# Patient Record
Sex: Female | Born: 1945
Health system: Southern US, Community
[De-identification: ages and names within clinical notes are randomized; demographics above are authoritative.]

## PROBLEM LIST (undated history)

## (undated) DIAGNOSIS — M1710 Unilateral primary osteoarthritis, unspecified knee: Secondary | ICD-10-CM

## (undated) DIAGNOSIS — M1990 Unspecified osteoarthritis, unspecified site: Secondary | ICD-10-CM

## (undated) DIAGNOSIS — E785 Hyperlipidemia, unspecified: Secondary | ICD-10-CM

## (undated) DIAGNOSIS — M179 Osteoarthritis of knee, unspecified: Secondary | ICD-10-CM

## (undated) DIAGNOSIS — I10 Essential (primary) hypertension: Secondary | ICD-10-CM

## (undated) DIAGNOSIS — Z9289 Personal history of other medical treatment: Secondary | ICD-10-CM

## (undated) DIAGNOSIS — R011 Cardiac murmur, unspecified: Secondary | ICD-10-CM

## (undated) DIAGNOSIS — F419 Anxiety disorder, unspecified: Secondary | ICD-10-CM

## (undated) DIAGNOSIS — Z9889 Other specified postprocedural states: Secondary | ICD-10-CM

## (undated) DIAGNOSIS — K5730 Diverticulosis of large intestine without perforation or abscess without bleeding: Secondary | ICD-10-CM

## (undated) DIAGNOSIS — R112 Nausea with vomiting, unspecified: Secondary | ICD-10-CM

## (undated) HISTORY — PX: ABDOMINAL SURGERY: SHX537

## (undated) HISTORY — DX: Diverticulosis of large intestine without perforation or abscess without bleeding: K57.30

## (undated) HISTORY — DX: Unilateral primary osteoarthritis, unspecified knee: M17.10

## (undated) HISTORY — DX: Personal history of other medical treatment: Z92.89

## (undated) HISTORY — DX: Essential (primary) hypertension: I10

## (undated) HISTORY — PX: NO PAST SURGERIES: SHX2092

## (undated) HISTORY — PX: COLONOSCOPY: SHX174

## (undated) HISTORY — DX: Hyperlipidemia, unspecified: E78.5

## (undated) HISTORY — DX: Unspecified osteoarthritis, unspecified site: M19.90

## (undated) HISTORY — DX: Osteoarthritis of knee, unspecified: M17.9

## (undated) HISTORY — DX: Cardiac murmur, unspecified: R01.1

---

## 1997-04-19 DIAGNOSIS — K5730 Diverticulosis of large intestine without perforation or abscess without bleeding: Secondary | ICD-10-CM

## 1997-04-19 HISTORY — DX: Diverticulosis of large intestine without perforation or abscess without bleeding: K57.30

## 2011-10-04 ENCOUNTER — Other Ambulatory Visit (HOSPITAL_COMMUNITY)
Admission: RE | Admit: 2011-10-04 | Discharge: 2011-10-04 | Disposition: A | Discharge status: Home / Self Care | Payer: Medicare Other | Source: Ambulatory Visit | Attending: Family Medicine | Admitting: Family Medicine

## 2011-10-04 DIAGNOSIS — Z124 Encounter for screening for malignant neoplasm of cervix: Secondary | ICD-10-CM | POA: Insufficient documentation

## 2012-04-13 ENCOUNTER — Encounter: Payer: Self-pay | Admitting: Internal Medicine

## 2012-04-13 ENCOUNTER — Ambulatory Visit (INDEPENDENT_AMBULATORY_CARE_PROVIDER_SITE_OTHER): Payer: Medicare Other | Admitting: Internal Medicine

## 2012-04-13 VITALS — BP 162/100 | HR 82 | Temp 98.5°F | Ht 63.0 in | Wt 199.0 lb

## 2012-04-13 DIAGNOSIS — E669 Obesity, unspecified: Secondary | ICD-10-CM | POA: Insufficient documentation

## 2012-04-13 DIAGNOSIS — Z1239 Encounter for other screening for malignant neoplasm of breast: Secondary | ICD-10-CM

## 2012-04-13 DIAGNOSIS — M179 Osteoarthritis of knee, unspecified: Secondary | ICD-10-CM | POA: Insufficient documentation

## 2012-04-13 DIAGNOSIS — E785 Hyperlipidemia, unspecified: Secondary | ICD-10-CM | POA: Insufficient documentation

## 2012-04-13 DIAGNOSIS — M1710 Unilateral primary osteoarthritis, unspecified knee: Secondary | ICD-10-CM | POA: Insufficient documentation

## 2012-04-13 DIAGNOSIS — IMO0002 Reserved for concepts with insufficient information to code with codable children: Secondary | ICD-10-CM

## 2012-04-13 DIAGNOSIS — E663 Overweight: Secondary | ICD-10-CM

## 2012-04-13 DIAGNOSIS — I10 Essential (primary) hypertension: Secondary | ICD-10-CM | POA: Insufficient documentation

## 2012-04-13 MED ORDER — AMLODIPINE BESYLATE 10 MG PO TABS: 10.0000 mg | ORAL_TABLET | Freq: Every day | ORAL | Status: DC

## 2012-04-13 NOTE — Assessment & Plan Note (Signed)
Wt Readings from Last 3 Encounters:  04/13/12 199 lb (90.266 kg)   The patient is asked to make an attempt to improve diet and exercise patterns to aid in medical management of this problem. Reviewed effect of weight on blood pressure, lipids and osteoarthritis mgmt

## 2012-04-13 NOTE — Patient Instructions (Signed)
It was good to see you today. we will send to your prior provider(s) for "release of records" as discussed today -  Medications reviewed and updated - increase amlodipine to 10mg  daily, no other changes at this time. Your prescription(s) have been submitted to your pharmacy. Please take as directed and contact our office if you believe you are having problem(s) with the medication(s). we'll make referral to orthopedics for consideration of "gel shots" to your right knee. Our office will contact you regarding appointment(s) once made. Please schedule followup in 4-6 weeks to recheck blood pressure, call sooner if problems.

## 2012-04-13 NOTE — Assessment & Plan Note (Signed)
BP Readings from Last 3 Encounters:  04/13/12 162/100   Uncontrolled Titrate up amlodipine to max now Continue ACEI/hctz Send for labs from prior PCP Advised on DASH and need for weight control - diet/exercise to control same

## 2012-04-13 NOTE — Assessment & Plan Note (Signed)
Refer to ortho to consider synvisc type injects - she declines steroid injection today continue otc symptomatic as ongoing until then

## 2012-04-13 NOTE — Progress Notes (Signed)
Subjective:    Patient ID: Donna Lawson, female    DOB: 10/05/1945, 66 y.o.   MRN: [id number]  HPI New pt to me and our practice - reviewed chronic medical issues: Transfer from [place], move from [place] to be near son after widowed in 2011 Reviewed chronic medical issues:  hypertension - the patient reports compliance with medication(s) as prescribed. Denies adverse side effects.  dyslipidemia - on statin - the patient reports compliance with medication(s) as prescribed. Denies adverse side effects.  osteoarthritis - R knee - constant swelling and pain, worse with exertion - denies prior injections - takes daily OTC ibuprofen or tylenol for same with decreasing pain relief- no prior ortho eval but "xrays show bone on bone"  Past Medical History  Diagnosis Date  . Arthritis   . Heart murmur   . Hypertension   . Hyperlipidemia   . History of blood transfusion   . Diverticula, colon 1999  . Osteoarthritis of knee     right   Family History  Problem Relation Age of Onset  . Arthritis Mother   . Arthritis Father   . Hypertension Other    History  Substance Use Topics  . Smoking status: Former Research scientist (life sciences)  . Smokeless tobacco: Not on file     Comment: widowed  . Alcohol Use: No   Review of Systems Constitutional: Negative for fever or recent weight change.  Respiratory: Negative for cough and shortness of breath.   Cardiovascular: Negative for chest pain or palpitations.  Gastrointestinal: Negative for abdominal pain, no bowel changes.  Musculoskeletal: Negative for gait problem or new joint swelling (chronic R knee swelling).  Skin: Negative for rash.  Neurological: Negative for dizziness or headache.  No other specific complaints in a complete review of systems (except as listed in HPI above).     Objective:   Physical Exam BP 162/100  Pulse 82  Temp 98.5 F (36.9 C) (Oral)  Ht 5\' 3"  (1.6 m)  Wt 199 lb (90.266 kg)  BMI 35.25 kg/m2  SpO2 94% Wt Readings from Last 3  Encounters:  04/13/12 199 lb (90.266 kg)   Constitutional: She is overweight, but appears well-developed and well-nourished. No distress.  HENT: Head: Normocephalic and atraumatic. Ears: B TMs ok, no erythema or effusion; Nose: Nose normal. Mouth/Throat: Oropharynx is clear and moist. No oropharyngeal exudate.  Eyes: Conjunctivae and EOM are normal. Pupils are equal, round, and reactive to light. No scleral icterus.  Neck: Normal range of motion. Neck supple. No LAD. No JVD present. No thyromegaly present.  Cardiovascular: Normal rate, regular rhythm and normal heart sounds.  No murmur heard. No BLE edema. Pulmonary/Chest: Effort normal and breath sounds normal. No respiratory distress. She has no wheezes.  Abdominal: Soft. Bowel sounds are normal. She exhibits no distension. There is no tenderness. no masses Musculoskeletal: R knee - boggy synovitis - tender to palpation over joint line; FROM and ligamentous function intact. Normal range of motion, no joint effusions. No gross deformities Neurological: She is alert and oriented to person, place, and time. No cranial nerve deficit. Coordination normal.  Skin: Skin is warm and dry. No rash noted. No erythema.  Psychiatric: She has a normal mood and affect. Her behavior is normal. Judgment and thought content normal.   No results found for this basename: WBC, HGB, HCT, PLT, GLUCOSE, CHOL, TRIG, HDL, LDLDIRECT, LDLCALC, ALT, AST, NA, K, CL, CREATININE, BUN, CO2, TSH, PSA, INR, GLUF, HGBA1C, MICROALBUR        Assessment &  Plan:  See problem list. Medications and labs reviewed today.  Time spent with pt today 45 minutes, greater than 50% time spent counseling patient on hypertension, lipids, knee pain, weight and medication review. Also review of prior records and need for ROI

## 2012-04-13 NOTE — Assessment & Plan Note (Signed)
On statin Send for ROI to review recent labs The current medical regimen is effective;  continue present plan and medications.

## 2012-05-09 ENCOUNTER — Telehealth: Payer: Self-pay | Admitting: Internal Medicine

## 2012-05-09 NOTE — Telephone Encounter (Signed)
Forward 17 pages from [place] to Dr. Gwendolyn Grant for review on 05-09-12 ym

## 2012-05-10 ENCOUNTER — Emergency Department (HOSPITAL_COMMUNITY)
Admission: EM | Admit: 2012-05-10 | Discharge: 2012-05-10 | Disposition: A | Discharge status: Home / Self Care | Payer: Medicare Other | Attending: Emergency Medicine | Admitting: Emergency Medicine

## 2012-05-10 ENCOUNTER — Ambulatory Visit: Payer: Medicare Other

## 2012-05-10 ENCOUNTER — Encounter (HOSPITAL_COMMUNITY): Payer: Self-pay | Admitting: *Deleted

## 2012-05-10 DIAGNOSIS — Z8639 Personal history of other endocrine, nutritional and metabolic disease: Secondary | ICD-10-CM | POA: Insufficient documentation

## 2012-05-10 DIAGNOSIS — R112 Nausea with vomiting, unspecified: Secondary | ICD-10-CM | POA: Insufficient documentation

## 2012-05-10 DIAGNOSIS — Z8739 Personal history of other diseases of the musculoskeletal system and connective tissue: Secondary | ICD-10-CM | POA: Insufficient documentation

## 2012-05-10 DIAGNOSIS — Y9289 Other specified places as the place of occurrence of the external cause: Secondary | ICD-10-CM | POA: Insufficient documentation

## 2012-05-10 DIAGNOSIS — Y9389 Activity, other specified: Secondary | ICD-10-CM | POA: Insufficient documentation

## 2012-05-10 DIAGNOSIS — I10 Essential (primary) hypertension: Secondary | ICD-10-CM | POA: Insufficient documentation

## 2012-05-10 DIAGNOSIS — Z862 Personal history of diseases of the blood and blood-forming organs and certain disorders involving the immune mechanism: Secondary | ICD-10-CM | POA: Insufficient documentation

## 2012-05-10 DIAGNOSIS — IMO0002 Reserved for concepts with insufficient information to code with codable children: Secondary | ICD-10-CM | POA: Insufficient documentation

## 2012-05-10 DIAGNOSIS — R011 Cardiac murmur, unspecified: Secondary | ICD-10-CM | POA: Insufficient documentation

## 2012-05-10 DIAGNOSIS — T783XXA Angioneurotic edema, initial encounter: Secondary | ICD-10-CM | POA: Insufficient documentation

## 2012-05-10 DIAGNOSIS — Z79899 Other long term (current) drug therapy: Secondary | ICD-10-CM | POA: Insufficient documentation

## 2012-05-10 DIAGNOSIS — Z87891 Personal history of nicotine dependence: Secondary | ICD-10-CM | POA: Insufficient documentation

## 2012-05-10 DIAGNOSIS — Z8719 Personal history of other diseases of the digestive system: Secondary | ICD-10-CM | POA: Insufficient documentation

## 2012-05-10 MED ORDER — DIPHENHYDRAMINE HCL 25 MG PO CAPS
25.0000 mg | ORAL_CAPSULE | Freq: Once | ORAL | Status: AC
  Administered 2012-05-10: 25 mg via ORAL
  Filled 2012-05-10: qty 1

## 2012-05-10 MED ORDER — FAMOTIDINE 20 MG PO TABS
20.0000 mg | ORAL_TABLET | Freq: Once | ORAL | Status: AC
  Administered 2012-05-10: 20 mg via ORAL
  Filled 2012-05-10: qty 1

## 2012-05-10 MED ORDER — FAMOTIDINE 20 MG PO TABS: 20.0000 mg | ORAL_TABLET | Freq: Two times a day (BID) | ORAL | Status: DC

## 2012-05-10 MED ORDER — DEXAMETHASONE SODIUM PHOSPHATE 10 MG/ML IJ SOLN
10.0000 mg | Freq: Once | INTRAMUSCULAR | Status: AC
  Administered 2012-05-10: 10 mg via INTRAVENOUS
  Filled 2012-05-10: qty 1

## 2012-05-10 MED ORDER — HYDROCHLOROTHIAZIDE 25 MG PO TABS: 25.0000 mg | ORAL_TABLET | Freq: Every day | ORAL | Status: DC

## 2012-05-10 MED ORDER — DIPHENHYDRAMINE HCL 25 MG PO CAPS: 25.0000 mg | ORAL_CAPSULE | Freq: Three times a day (TID) | ORAL | Status: DC

## 2012-05-10 MED ORDER — PREDNISONE 20 MG PO TABS: 40.0000 mg | ORAL_TABLET | Freq: Every day | ORAL | Status: DC

## 2012-05-10 MED ORDER — SODIUM CHLORIDE 0.9 % IV BOLUS (SEPSIS)
1000.0000 mL | Freq: Once | INTRAVENOUS | Status: AC
  Administered 2012-05-10: 1000 mL via INTRAVENOUS

## 2012-05-10 NOTE — ED Notes (Signed)
Pt states she is "much better and wants to go home"

## 2012-05-10 NOTE — ED Provider Notes (Signed)
History     CSN: [id number]  Arrival date & time 05/10/12  [id number]   First MD Initiated Contact with Patient 05/10/12 [phone]      Chief Complaint  Patient presents with  . Angioedema    (Consider location/radiation/quality/duration/timing/severity/associated sxs/prior treatment) HPI The patient presents with ongoing nausea, and new tongue swelling.  She states that approximately 14 hours ago, she went out for dinner.  She states that soon after eating she of nausea, had innumerable episodes of emesis.  In the interval the emesis has subsided, but she continues to have nausea.  Approximately 2 hours ago the patient awoke, noticed that her tongue was swollen.  She denies new dyspnea, chest pain, lightheadedness, syncope.  States that she feels that generally unwell secondary to the nausea, but denies new pain.  No medication taken, no clear exacerbating factors, but the tongue swelling seems to be improving slightly since onset.  The patient states that she has been compliant with all medications, has no new medication changes, no new health issues. Past Medical History  Diagnosis Date  . Arthritis   . Heart murmur   . Hypertension   . Hyperlipidemia   . History of blood transfusion   . Diverticula, colon 1999  . Osteoarthritis of knee     right    Past Surgical History  Procedure Date  . No past surgeries     Family History  Problem Relation Age of Onset  . Arthritis Mother   . Arthritis Father   . Hypertension Other     History  Substance Use Topics  . Smoking status: Former Research scientist (life sciences)  . Smokeless tobacco: Not on file     Comment: widowed  . Alcohol Use: No    OB History    Grav Para Term Preterm Abortions TAB SAB Ect Mult Living                  Review of Systems  Constitutional:       Per HPI, otherwise negative  HENT:       Per HPI, otherwise negative  Eyes: Negative.   Respiratory:       Per HPI, otherwise negative  Cardiovascular:       Per HPI, otherwise  negative  Gastrointestinal: Positive for nausea and vomiting. Negative for diarrhea.  Genitourinary: Negative.   Musculoskeletal:       Per HPI, otherwise negative  Skin: Negative.   Neurological: Negative for syncope.    Allergies  Review of patient's allergies indicates no known allergies.  Home Medications   Current Outpatient Rx  Name  Route  Sig  Dispense  Refill  . AMLODIPINE BESYLATE 10 MG PO TABS   Oral   Take 1 tablet (10 mg total) by mouth daily. Take 1 by mouth daily   30 tablet   3   . LISINOPRIL-HYDROCHLOROTHIAZIDE 20-25 MG PO TABS   Oral   Take 1 tablet by mouth 2 (two) times daily. Take 1 by mouth daily           BP 139/87  Pulse 91  Temp 98.9 F (37.2 C) (Oral)  Resp 18  SpO2 98%  Physical Exam  Nursing note and vitals reviewed. Constitutional: She is oriented to person, place, and time. She appears well-developed and well-nourished. No distress.  HENT:  Head: Normocephalic and atraumatic.  Mouth/Throat: Uvula is midline, oropharynx is clear and moist and mucous membranes are normal. No oropharyngeal exudate, posterior oropharyngeal edema, posterior oropharyngeal erythema or tonsillar  abscesses.    Eyes: Conjunctivae normal and EOM are normal.  Neck: No tracheal deviation present.  Cardiovascular: Normal rate and regular rhythm.   Pulmonary/Chest: Effort normal and breath sounds normal. No stridor. No respiratory distress. She has no decreased breath sounds. She has no wheezes.  Abdominal: She exhibits no distension.  Musculoskeletal: She exhibits no edema.  Neurological: She is alert and oriented to person, place, and time. No cranial nerve deficit.  Skin: Skin is warm and dry.  Psychiatric: She has a normal mood and affect.    ED Course  Procedures (including critical care time)  Labs Reviewed - No data to display No results found.   No diagnosis found.  Oxygen 100% room air normal   11:17 AM No new complaints.  Patient states  that she feels better.  Tongue appears smaller.  MDM  This patient presents with glossal enlargement.  Given that she is on an ACE inhibitor, this is a consideration.  Additionally, the patient describes food reaction, which may be an element of allergic reaction.  On exam the patient is uncomfortable, but there is little evidence of posterior oral pharyngeal edema, no stridor, no dyspnea, no tachypnea.  This is all reassuring.  The patient received IV fluids, steroids, antihistamines, though the evidence for these is moderate.  Absent new complaints, and with decreased glossal edema, the patient was discharged in stable condition with instructions to stop her lisinopril, follow up with her primary care physician for additional medication considerations for her blood pressure issues.       Carmin Muskrat, MD 05/10/12 1118

## 2012-05-10 NOTE — ED Notes (Signed)
PT c/o tongue swelling and difficultly swallowing since waking up this morning. Pt takes lisinopril for BP.

## 2012-05-11 ENCOUNTER — Telehealth: Payer: Self-pay | Admitting: Internal Medicine

## 2012-05-11 ENCOUNTER — Ambulatory Visit: Payer: Medicare Other | Admitting: Internal Medicine

## 2012-05-11 MED ORDER — ATENOLOL 25 MG PO TABS: 25.0000 mg | ORAL_TABLET | Freq: Every day | ORAL | Status: DC

## 2012-05-11 NOTE — Telephone Encounter (Signed)
Noted. ER visit and medication changes reviewed:  Agree with discontinuation of combination lisinopril.  Agree with new prescription for solo diuretic (HCTZ) Continue amlodipine/Norvasc at 10 mg daily dose (same) Will start low-dose atenolol in place of prior lisinopril -start atenolol 25 mg tablet once daily, new electronically prescription sent Need ROV with me in next 2-4 weeks to review medications and recheck blood pressure, call sooner if problems. Thanks

## 2012-05-11 NOTE — Telephone Encounter (Signed)
Called pt back gave her md recommendation. Pt states she can't take atenolol have tried med in the past. Med gave her headaches & palpitations. Pt states she has appt on 05/16/12 will discuss with md then. Still taking the norvasc BP been running good...lmb

## 2012-05-11 NOTE — Telephone Encounter (Signed)
Noted - meds updated - will review at [facility]

## 2012-05-11 NOTE — Telephone Encounter (Signed)
Caller: Donna Lawson/Patient; Phone: [phone]; Reason for Call: Patient states she was in the ER last night 05/10/12, for tongue edema and allergic reaction Lisinopril HCTZ.  She states the ER physician told her to follow up with her physician today Dr.  Asa Lente for alternative medication.  Patient cannot provide further information , she has to be in [place] this morning.  She states she will call back and update but needs a new medication.  Advised I would forward information to physican and office PLEASE SEE ER REPORT FROM LAST EVENING IN EPIC

## 2012-05-11 NOTE — Telephone Encounter (Signed)
Agnieszka calling because Pharmacy called her stating a script for Atenolol was ready for pick up.  States she can not take Atenolol and had called the office this morning.  Noted in EPIC that script had been discontinued.  Per previous note, pt will discuss medications at office visit on 05/16/12.

## 2012-05-16 ENCOUNTER — Other Ambulatory Visit: Payer: Self-pay | Admitting: Internal Medicine

## 2012-05-16 ENCOUNTER — Telehealth: Payer: Self-pay | Admitting: *Deleted

## 2012-05-16 ENCOUNTER — Encounter: Payer: Self-pay | Admitting: Internal Medicine

## 2012-05-16 ENCOUNTER — Other Ambulatory Visit (INDEPENDENT_AMBULATORY_CARE_PROVIDER_SITE_OTHER): Payer: Medicare Other

## 2012-05-16 ENCOUNTER — Ambulatory Visit (INDEPENDENT_AMBULATORY_CARE_PROVIDER_SITE_OTHER): Payer: Medicare Other | Admitting: Internal Medicine

## 2012-05-16 VITALS — BP 140/86 | HR 78 | Temp 98.8°F | Ht 63.0 in | Wt 189.1 lb

## 2012-05-16 DIAGNOSIS — R5381 Other malaise: Secondary | ICD-10-CM

## 2012-05-16 DIAGNOSIS — I10 Essential (primary) hypertension: Secondary | ICD-10-CM

## 2012-05-16 DIAGNOSIS — E785 Hyperlipidemia, unspecified: Secondary | ICD-10-CM

## 2012-05-16 DIAGNOSIS — R5383 Other fatigue: Secondary | ICD-10-CM

## 2012-05-16 DIAGNOSIS — F418 Other specified anxiety disorders: Secondary | ICD-10-CM

## 2012-05-16 DIAGNOSIS — F411 Generalized anxiety disorder: Secondary | ICD-10-CM

## 2012-05-16 DIAGNOSIS — E876 Hypokalemia: Secondary | ICD-10-CM

## 2012-05-16 LAB — CBC WITH DIFFERENTIAL/PLATELET
Basophils Absolute: 0.1 10*3/uL (ref 0.0–0.1)
Eosinophils Absolute: 0.1 10*3/uL (ref 0.0–0.7)
Lymphocytes Relative: 20 % (ref 12.0–46.0)
MCHC: 34.4 g/dL (ref 30.0–36.0)
Monocytes Relative: 6.3 % (ref 3.0–12.0)
Platelets: 231 10*3/uL (ref 150.0–400.0)
RDW: 13.5 % (ref 11.5–14.6)

## 2012-05-16 LAB — LIPID PANEL
Cholesterol: 259 mg/dL — ABNORMAL HIGH (ref 0–200)
HDL: 58.8 mg/dL (ref >39.00)
LDL Cholesterol: 150 mg/dL — ABNORMAL HIGH (ref 0–99)
Triglycerides: 250 mg/dL — ABNORMAL HIGH (ref 0.0–149.0)
VLDL: 50 mg/dL — ABNORMAL HIGH (ref 0.0–40.0)

## 2012-05-16 LAB — BASIC METABOLIC PANEL
BUN: 17 mg/dL (ref 6–23)
Calcium: 9.5 mg/dL (ref 8.4–10.5)
Creatinine, Ser: 0.8 mg/dL (ref 0.4–1.2)
GFR: 80.42 mL/min (ref >60.00)
Glucose, Bld: 114 mg/dL — ABNORMAL HIGH (ref 70–99)

## 2012-05-16 LAB — HEPATIC FUNCTION PANEL
AST: 18 U/L (ref 0–37)
Total Bilirubin: 0.8 mg/dL (ref 0.3–1.2)

## 2012-05-16 MED ORDER — PRAVASTATIN SODIUM 20 MG PO TABS: 20.0000 mg | ORAL_TABLET | Freq: Every day | ORAL | Status: DC

## 2012-05-16 MED ORDER — POTASSIUM CHLORIDE ER 10 MEQ PO TBCR: 10.0000 meq | EXTENDED_RELEASE_TABLET | Freq: Two times a day (BID) | ORAL | Status: DC

## 2012-05-16 MED ORDER — AMLODIPINE BESYLATE 10 MG PO TABS: 5.0000 mg | ORAL_TABLET | Freq: Two times a day (BID) | ORAL | Status: DC

## 2012-05-16 MED ORDER — ALPRAZOLAM 0.5 MG PO TABS: 0.5000 mg | ORAL_TABLET | Freq: Three times a day (TID) | ORAL | Status: DC | PRN

## 2012-05-16 NOTE — Assessment & Plan Note (Signed)
BP Readings from Last 3 Encounters:  05/16/12 140/86  05/10/12 119/65  04/13/12 162/100   Uncontrolled Titrate up amlodipine to max late 03/2012 - will divide into 5mg  BID breast cancer pt feels 10mg  "too strong" for qd Continue /hctz Off ACE since 04/2012 ER visit due to angioedema side effects  Advised on DASH and need for weight control - diet/exercise to control same

## 2012-05-16 NOTE — Telephone Encounter (Signed)
Lab called with critical-potassium 2.8.

## 2012-05-16 NOTE — Progress Notes (Signed)
  Subjective:    Patient ID: Donna Lawson, female    DOB: 1946/03/31, 67 y.o.   MRN: [id number]  HPI  Here for ER follow up - evaluation for angioedema related to recent lisinopril prescription  Treated for same in the emergency room, symptoms have resolved   Also reviewed chronic medical issues:  hypertension - the patient reports compliance with medication(s) as prescribed. Denies adverse side effects.  dyslipidemia - on statin - the patient reports compliance with medication(s) as prescribed. Denies adverse side effects.  osteoarthritis - R knee - constant swelling and pain, worse with exertion - denies prior injections - takes daily OTC ibuprofen or tylenol for same with decreasing pain relief- no prior ortho eval but "xrays show bone on bone"  Past Medical History  Diagnosis Date  . Arthritis   . Heart murmur   . Hypertension   . Hyperlipidemia   . History of blood transfusion   . Diverticula, colon 1999  . Osteoarthritis of knee     right    Review of Systems  Constitutional: Positive for fatigue. Negative for unexpected weight change.  Respiratory: Negative for cough and shortness of breath.   Psychiatric/Behavioral: Positive for sleep disturbance. Negative for suicidal ideas and self-injury. The patient is nervous/anxious.        Objective:   Physical Exam BP 140/86  Pulse 78  Temp 98.8 F (37.1 C) (Oral)  Ht 5\' 3"  (1.6 m)  Wt 189 lb 1.9 oz (85.784 kg)  BMI 33.50 kg/m2  SpO2 96% Wt Readings from Last 3 Encounters:  05/16/12 189 lb 1.9 oz (85.784 kg)  04/13/12 199 lb (90.266 kg)   Constitutional: She appears well-developed and well-nourished. No distress.  HENT: no angioedema Neck: Normal range of motion. Neck supple. No JVD present. No thyromegaly present.  Cardiovascular: Normal rate, regular rhythm and normal heart sounds.  No murmur heard. No BLE edema. Pulmonary/Chest: Effort normal and breath sounds normal. No respiratory distress. She has no  wheezes.  Neurological: She is alert and oriented to person, place, and time. No cranial nerve deficit. Coordination normal.  Psychiatric: She has a mildly anxious mood and affect. Her behavior is normal. Judgment and thought content normal.   No results found for this basename: WBC, HGB, HCT, PLT, GLUCOSE, CHOL, TRIG, HDL, LDLDIRECT, LDLCALC, ALT, AST, NA, K, CL, CREATININE, BUN, CO2, TSH, PSA, INR, GLUF, HGBA1C, MICROALBUR      Assessment & Plan:   Angioedema, resolved - ACEI stopped due to same, now on allergy list  Anxiety, situationally induced - g-son issued restraining order by his former g-friend - xanax prn provided, support and reassurance provided  Fatigue - nonspecific symptoms/exam - check screening labs  Also See problem list. Medications and labs reviewed today.

## 2012-05-16 NOTE — Assessment & Plan Note (Signed)
previously rx'd atorvastatin and simva - stopped due to myalgias and feared side effects  Check labs now and consider prava as needed

## 2012-05-16 NOTE — Telephone Encounter (Signed)
Called pt, number busy, unable to leave message.

## 2012-05-16 NOTE — Patient Instructions (Addendum)
It was good to see you today. We have reviewed your ER records including labs and tests today Medications reviewed and updated -change amlodipine to half tablet (5 mg) twice daily (total 10 mg each day) Continue hydrochlorothiazide for fluid pill to help blood pressure Start pravastatin for cholesterol treatment in place of recently prescribed Lipitor Use Xanax as needed for anxiety and nerves Your prescription(s) have been submitted to your pharmacy. Please take as directed and contact our office if you believe you are having problem(s) with the medication(s). Test(s) ordered today. Your results will be released to [place] (or called to you) after review, usually within 72hours after test completion. If any changes need to be made, you will be notified at that same time. Please schedule followup in 3-4 months, call sooner if problems.

## 2012-05-16 NOTE — Telephone Encounter (Signed)
[name], Can you please call [name] and let her know that her potassium level is low. This is likely due to her taking the HCTZ. I have prescribed a potassium supplement for her to take once a day. She should come back to the office in 1 week for a lab only appoint to recheck potassium levels. [name]

## 2012-05-16 NOTE — Telephone Encounter (Signed)
Called pt, unable to reach pt, unable to leave message. Number busy.

## 2012-05-17 NOTE — Telephone Encounter (Signed)
Called pt, unable to leave message, number busy.

## 2012-05-17 NOTE — Telephone Encounter (Signed)
Pt returned call, called pt, unable to leave message, number busy.

## 2012-05-17 NOTE — Telephone Encounter (Signed)
Left message for pt to callback office.  

## 2012-05-18 NOTE — Telephone Encounter (Signed)
Pt informed of results of new rx for potassium and NP's advisement.

## 2012-05-23 ENCOUNTER — Telehealth: Payer: Self-pay | Admitting: Internal Medicine

## 2012-05-23 MED ORDER — HYDROCHLOROTHIAZIDE 25 MG PO TABS: 25.0000 mg | ORAL_TABLET | Freq: Every day | ORAL | Status: DC

## 2012-05-23 NOTE — Telephone Encounter (Signed)
Notified pt rx sent to [facility].../lmb 

## 2012-05-23 NOTE — Telephone Encounter (Signed)
Patient saw on her AVS to continue taking the hydrochlorothiazide but she does not have any left, she needs these called in to [facility] on [address] if she is to continue taking them

## 2012-05-26 ENCOUNTER — Other Ambulatory Visit (INDEPENDENT_AMBULATORY_CARE_PROVIDER_SITE_OTHER): Payer: Medicare Other

## 2012-05-26 DIAGNOSIS — E876 Hypokalemia: Secondary | ICD-10-CM

## 2012-05-26 LAB — BASIC METABOLIC PANEL
CO2: 28 mEq/L (ref 19–32)
Calcium: 9.6 mg/dL (ref 8.4–10.5)
Chloride: 105 mEq/L (ref 96–112)
Sodium: 140 mEq/L (ref 135–145)

## 2012-05-30 ENCOUNTER — Ambulatory Visit: Payer: Medicare Other

## 2012-08-14 ENCOUNTER — Ambulatory Visit: Payer: Medicare Other | Admitting: Internal Medicine

## 2012-08-14 DIAGNOSIS — Z0289 Encounter for other administrative examinations: Secondary | ICD-10-CM

## 2012-08-21 ENCOUNTER — Ambulatory Visit (INDEPENDENT_AMBULATORY_CARE_PROVIDER_SITE_OTHER): Payer: Medicare Other | Admitting: Internal Medicine

## 2012-08-21 ENCOUNTER — Encounter: Payer: Self-pay | Admitting: Internal Medicine

## 2012-08-21 VITALS — BP 132/90 | HR 78 | Temp 98.7°F | Wt 191.2 lb

## 2012-08-21 DIAGNOSIS — E663 Overweight: Secondary | ICD-10-CM

## 2012-08-21 DIAGNOSIS — I10 Essential (primary) hypertension: Secondary | ICD-10-CM

## 2012-08-21 DIAGNOSIS — E785 Hyperlipidemia, unspecified: Secondary | ICD-10-CM

## 2012-08-21 MED ORDER — HYDROCHLOROTHIAZIDE 25 MG PO TABS: 25.0000 mg | ORAL_TABLET | Freq: Every day | ORAL | Status: DC

## 2012-08-21 MED ORDER — AMLODIPINE BESYLATE 10 MG PO TABS: 5.0000 mg | ORAL_TABLET | Freq: Two times a day (BID) | ORAL | Status: DC

## 2012-08-21 MED ORDER — ATORVASTATIN CALCIUM 20 MG PO TABS: 20.0000 mg | ORAL_TABLET | Freq: Every day | ORAL | Status: DC

## 2012-08-21 NOTE — Patient Instructions (Signed)
It was good to see you today. Medications reviewed and updated -change pravastatin back to generic lipitor - take as discussed Your prescription(s)/refills have been submitted to your pharmacy. Please take as directed and contact our office if you believe you are having problem(s) with the medication(s). Please schedule followup in 3-4 months for cholesterol and blood pressure, call sooner if problems.

## 2012-08-21 NOTE — Progress Notes (Signed)
  Subjective:    Patient ID: Donna Lawson, female    DOB: October 08, 1945, 67 y.o.   MRN: [id number]  HPI  Here for follow up - reviewed chronic medical issues:  hypertension - the patient reports compliance with medication(s) as prescribed. Denies adverse side effects.  dyslipidemia - on statin - the patient reports compliance with medication(s) as prescribed. Denies adverse side effects.  osteoarthritis - R knee - constant swelling and pain, worse with exertion - denies prior injections - takes daily OTC ibuprofen or tylenol for same with decreasing pain relief- no prior ortho eval but "xrays show bone on bone"  Past Medical History  Diagnosis Date  . Arthritis   . Heart murmur   . Hypertension   . Hyperlipidemia   . History of blood transfusion   . Diverticula, colon 1999  . Osteoarthritis of knee     right    Review of Systems  Constitutional: Positive for fatigue. Negative for unexpected weight change.  Respiratory: Negative for cough and shortness of breath.   Psychiatric/Behavioral: Negative for suicidal ideas and self-injury. The patient is nervous/anxious (chronic).        Objective:   Physical Exam  BP 132/90  Pulse 78  Temp(Src) 98.7 F (37.1 C) (Oral)  Wt 191 lb 3.2 oz (86.728 kg)  BMI 33.88 kg/m2  SpO2 95% Wt Readings from Last 3 Encounters:  08/21/12 191 lb 3.2 oz (86.728 kg)  05/16/12 189 lb 1.9 oz (85.784 kg)  04/13/12 199 lb (90.266 kg)   Constitutional: She is obese, but appears well-developed and well-nourished. No distress.  Neck: Normal range of motion. Neck supple. No JVD present. No thyromegaly present.  Cardiovascular: Normal rate, regular rhythm and normal heart sounds.  No murmur heard. No BLE edema. Pulmonary/Chest: Effort normal and breath sounds normal. No respiratory distress. She has no wheezes.  Neurological: She is alert and oriented to person, place, and time. No cranial nerve deficit. Coordination normal.  Psychiatric: She has a  mildly anxious mood and affect. Her behavior is normal. Judgment and thought content normal.   Lab Results  Component Value Date   WBC 8.6 05/16/2012   HGB 14.1 05/16/2012   HCT 40.9 05/16/2012   PLT 231.0 05/16/2012   GLUCOSE 97 05/26/2012   CHOL 259* 05/16/2012   TRIG 250.0* 05/16/2012   HDL 58.80 05/16/2012   LDLCALC 150* 05/16/2012   ALT 18 05/16/2012   AST 18 05/16/2012   NA 140 05/26/2012   K 4.1 05/26/2012   CL 105 05/26/2012   CREATININE 0.7 05/26/2012   BUN 13 05/26/2012   CO2 28 05/26/2012   TSH 0.60 05/16/2012       Assessment & Plan:   see problem list. Medications and labs reviewed today.

## 2012-08-21 NOTE — Assessment & Plan Note (Signed)
BP Readings from Last 3 Encounters:  08/21/12 132/90  05/16/12 140/86  05/10/12 119/65   Variable control Titrate up amlodipine to max late 03/2012 - will divide into 5mg  BID because pt feels 10mg  "too strong" for qd Continue hctz Off ACE since 04/2012 ER visit due to angioedema side effects  Advised on DASH and need for weight control - diet/exercise to control same

## 2012-08-21 NOTE — Assessment & Plan Note (Signed)
previously rx'd atorvastatin, prava and simva - stopped due to myalgias and feared side effects  Reviewed last lipid profile - Pt willing to resume atorva now - encouraged to take as high dose as tolerable

## 2012-08-21 NOTE — Assessment & Plan Note (Signed)
Wt Readings from Last 3 Encounters:  08/21/12 191 lb 3.2 oz (86.728 kg)  05/16/12 189 lb 1.9 oz (85.784 kg)  04/13/12 199 lb (90.266 kg)   The patient is asked to make an attempt to improve diet and exercise patterns to aid in medical management of this problem. Reviewed effect of weight on blood pressure, lipids and osteoarthritis mgmt

## 2012-10-02 ENCOUNTER — Ambulatory Visit
Admission: RE | Admit: 2012-10-02 | Discharge: 2012-10-02 | Disposition: A | Discharge status: Home / Self Care | Payer: Medicare Other | Source: Ambulatory Visit | Attending: Internal Medicine | Admitting: Internal Medicine

## 2012-10-02 DIAGNOSIS — Z1239 Encounter for other screening for malignant neoplasm of breast: Secondary | ICD-10-CM

## 2012-10-13 ENCOUNTER — Other Ambulatory Visit: Payer: Self-pay | Admitting: Internal Medicine

## 2012-10-13 DIAGNOSIS — R928 Other abnormal and inconclusive findings on diagnostic imaging of breast: Secondary | ICD-10-CM

## 2012-11-03 ENCOUNTER — Ambulatory Visit
Admission: RE | Admit: 2012-11-03 | Discharge: 2012-11-03 | Disposition: A | Discharge status: Home / Self Care | Payer: Medicare Other | Source: Ambulatory Visit | Attending: Internal Medicine | Admitting: Internal Medicine

## 2012-11-03 ENCOUNTER — Other Ambulatory Visit: Payer: Self-pay | Admitting: Internal Medicine

## 2012-11-03 DIAGNOSIS — R928 Other abnormal and inconclusive findings on diagnostic imaging of breast: Secondary | ICD-10-CM

## 2012-11-15 ENCOUNTER — Telehealth: Payer: Self-pay | Admitting: Internal Medicine

## 2012-11-15 MED ORDER — AMLODIPINE BESYLATE 10 MG PO TABS: 5.0000 mg | ORAL_TABLET | Freq: Two times a day (BID) | ORAL | Status: DC

## 2012-11-15 NOTE — Telephone Encounter (Signed)
Pt needs a refill on Amlodipine.  She only has 2 pills left.  She made an appt for next Tues, Aug 5.  She uses [facility] on [address].

## 2012-11-15 NOTE — Telephone Encounter (Signed)
Tried calling pt to inform her rx sent to [place]. Will not go through keep getting busy signal. Med has been sent to [place]...lmb

## 2012-11-21 ENCOUNTER — Ambulatory Visit (INDEPENDENT_AMBULATORY_CARE_PROVIDER_SITE_OTHER): Payer: Medicare Other | Admitting: Internal Medicine

## 2012-11-21 ENCOUNTER — Encounter: Payer: Self-pay | Admitting: Internal Medicine

## 2012-11-21 VITALS — BP 152/90 | HR 81 | Temp 98.8°F | Wt 194.1 lb

## 2012-11-21 DIAGNOSIS — E785 Hyperlipidemia, unspecified: Secondary | ICD-10-CM

## 2012-11-21 DIAGNOSIS — I10 Essential (primary) hypertension: Secondary | ICD-10-CM

## 2012-11-21 MED ORDER — ATORVASTATIN CALCIUM 20 MG PO TABS: 20.0000 mg | ORAL_TABLET | Freq: Every day | ORAL | Status: DC

## 2012-11-21 MED ORDER — AMLODIPINE BESYLATE 10 MG PO TABS: 10.0000 mg | ORAL_TABLET | Freq: Every day | ORAL | Status: DC

## 2012-11-21 MED ORDER — METOPROLOL SUCCINATE ER 25 MG PO TB24: 25.0000 mg | ORAL_TABLET | Freq: Every day | ORAL | Status: DC

## 2012-11-21 NOTE — Progress Notes (Signed)
  Subjective:    Patient ID: Donna Lawson, female    DOB: Mar 12, 1946, 67 y.o.   MRN: [id number]  HPI  Here for follow up - reviewed chronic medical issues:  hypertension - the patient reports variable compliance with medication(s) as prescribed. Denies adverse side effects.  dyslipidemia - on statin - the patient reports compliance with medication(s) as prescribed. Denies adverse side effects.  osteoarthritis - R knee - constant swelling and pain, worse with exertion - denies prior injections - takes daily OTC ibuprofen or tylenol for same with decreasing pain relief- no prior ortho eval but "xrays show bone on bone"  Past Medical History  Diagnosis Date  . Arthritis   . Heart murmur   . Hypertension   . Hyperlipidemia   . History of blood transfusion   . Diverticula, colon 1999  . Osteoarthritis of knee     right    Review of Systems  Constitutional: Positive for fatigue. Negative for unexpected weight change.  Respiratory: Negative for cough and shortness of breath.   Psychiatric/Behavioral: Negative for suicidal ideas and self-injury. The patient is nervous/anxious (chronic).        Objective:   Physical Exam  BP 152/90  Pulse 81  Temp(Src) 98.8 F (37.1 C) (Oral)  Wt 194 lb 1.9 oz (88.052 kg)  BMI 34.4 kg/m2  SpO2 97% Wt Readings from Last 3 Encounters:  11/21/12 194 lb 1.9 oz (88.052 kg)  08/21/12 191 lb 3.2 oz (86.728 kg)  05/16/12 189 lb 1.9 oz (85.784 kg)   Constitutional: She is obese, but appears well-developed and well-nourished. No distress.  Neck: Normal range of motion. Neck supple. No JVD present. No thyromegaly present.  Cardiovascular: Normal rate, regular rhythm and normal heart sounds.  No murmur heard. No BLE edema. Pulmonary/Chest: Effort normal and breath sounds normal. No respiratory distress. She has no wheezes.  Neurological: She is alert and oriented to person, place, and time. No cranial nerve deficit. Coordination normal.  Psychiatric:  She has a mildly anxious mood and affect. Her behavior is normal. Judgment and thought content normal.   Lab Results  Component Value Date   WBC 8.6 05/16/2012   HGB 14.1 05/16/2012   HCT 40.9 05/16/2012   PLT 231.0 05/16/2012   GLUCOSE 97 05/26/2012   CHOL 259* 05/16/2012   TRIG 250.0* 05/16/2012   HDL 58.80 05/16/2012   LDLCALC 150* 05/16/2012   ALT 18 05/16/2012   AST 18 05/16/2012   NA 140 05/26/2012   K 4.1 05/26/2012   CL 105 05/26/2012   CREATININE 0.7 05/26/2012   BUN 13 05/26/2012   CO2 28 05/26/2012   TSH 0.60 05/16/2012       Assessment & Plan:   see problem list. Medications and labs reviewed today.

## 2012-11-21 NOTE — Assessment & Plan Note (Signed)
BP Readings from Last 3 Encounters:  11/21/12 152/90  08/21/12 132/90  05/16/12 140/86   Variable control Titrate up amlodipine to max late 03/2012 - inconsistent use because pt feels 10mg  "too strong"  Continue hctz and add beta-blocker now Off ACE since 04/2012 ER visit due to angioedema side effects  Advised on DASH and need for weight control - diet/exercise to control same

## 2012-11-21 NOTE — Patient Instructions (Signed)
It was good to see you today. Medications reviewed and updated - resume generic lipitor and start low dose Toprol- take as discussed Your prescription(s)/refills have been submitted to your pharmacy. Please take as directed and contact our office if you believe you are having problem(s) with the medication(s). Please schedule followup in 3-4 months for cholesterol and blood pressure, call sooner if problems.

## 2012-11-21 NOTE — Assessment & Plan Note (Signed)
previously rx'd atorvastatin, prava and simva - stopped due to myalgias and feared side effects  Reviewed last lipid profile - retry atorva 08/2012 never started - re-rx now- encouraged to take as tolerable

## 2012-11-23 ENCOUNTER — Ambulatory Visit
Admission: RE | Admit: 2012-11-23 | Discharge: 2012-11-23 | Disposition: A | Discharge status: Home / Self Care | Payer: Medicare Other | Source: Ambulatory Visit | Attending: Internal Medicine | Admitting: Internal Medicine

## 2012-11-23 ENCOUNTER — Other Ambulatory Visit: Payer: Self-pay | Admitting: Internal Medicine

## 2012-11-23 DIAGNOSIS — R928 Other abnormal and inconclusive findings on diagnostic imaging of breast: Secondary | ICD-10-CM

## 2013-01-26 ENCOUNTER — Telehealth: Payer: Self-pay | Admitting: *Deleted

## 2013-01-26 NOTE — Telephone Encounter (Signed)
Pt called requesting whether [facility] accepts [employer].  Unable to return call, recording states call will not go through.  Please advise

## 2013-02-21 ENCOUNTER — Ambulatory Visit: Payer: Medicare Other | Admitting: Internal Medicine

## 2013-03-13 ENCOUNTER — Other Ambulatory Visit (INDEPENDENT_AMBULATORY_CARE_PROVIDER_SITE_OTHER): Payer: Medicare Other

## 2013-03-13 ENCOUNTER — Ambulatory Visit (INDEPENDENT_AMBULATORY_CARE_PROVIDER_SITE_OTHER): Payer: Medicare Other | Admitting: Internal Medicine

## 2013-03-13 ENCOUNTER — Encounter: Payer: Self-pay | Admitting: Internal Medicine

## 2013-03-13 VITALS — BP 128/80 | HR 80 | Temp 98.7°F | Wt 199.4 lb

## 2013-03-13 DIAGNOSIS — M1710 Unilateral primary osteoarthritis, unspecified knee: Secondary | ICD-10-CM

## 2013-03-13 DIAGNOSIS — M179 Osteoarthritis of knee, unspecified: Secondary | ICD-10-CM

## 2013-03-13 DIAGNOSIS — I10 Essential (primary) hypertension: Secondary | ICD-10-CM

## 2013-03-13 DIAGNOSIS — Z79899 Other long term (current) drug therapy: Secondary | ICD-10-CM

## 2013-03-13 DIAGNOSIS — M722 Plantar fascial fibromatosis: Secondary | ICD-10-CM

## 2013-03-13 DIAGNOSIS — E785 Hyperlipidemia, unspecified: Secondary | ICD-10-CM

## 2013-03-13 DIAGNOSIS — IMO0002 Reserved for concepts with insufficient information to code with codable children: Secondary | ICD-10-CM

## 2013-03-13 LAB — BASIC METABOLIC PANEL
BUN: 15 mg/dL (ref 6–23)
Chloride: 104 mEq/L (ref 96–112)
GFR: 101.47 mL/min (ref >60.00)
Glucose, Bld: 106 mg/dL — ABNORMAL HIGH (ref 70–99)
Potassium: 3.4 mEq/L — ABNORMAL LOW (ref 3.5–5.1)
Sodium: 142 mEq/L (ref 135–145)

## 2013-03-13 LAB — LIPID PANEL
Cholesterol: 206 mg/dL — ABNORMAL HIGH (ref 0–200)
HDL: 67.9 mg/dL (ref >39.00)
Total CHOL/HDL Ratio: 3
VLDL: 23.6 mg/dL (ref 0.0–40.0)

## 2013-03-13 LAB — HEPATIC FUNCTION PANEL
Albumin: 4.4 g/dL (ref 3.5–5.2)
Alkaline Phosphatase: 87 U/L (ref 39–117)
Bilirubin, Direct: 0.1 mg/dL (ref 0.0–0.3)

## 2013-03-13 LAB — LDL CHOLESTEROL, DIRECT: Direct LDL: 119.2 mg/dL

## 2013-03-13 MED ORDER — MELOXICAM 15 MG PO TABS: 15.0000 mg | ORAL_TABLET | Freq: Every day | ORAL | Status: DC

## 2013-03-13 NOTE — Assessment & Plan Note (Signed)
previously rx'd atorvastatin, prava and simva - stopped due to myalgias and feared side effects  Reviewed last lipid profile - rx atorva 08/2012 but not started until 11/2012 Check now and encouraged to take as tolerable

## 2013-03-13 NOTE — Progress Notes (Signed)
  Subjective:    Patient ID: Donna Lawson, female    DOB: Aug 21, 1945, 67 y.o.   MRN: [id number]  HPI Here for follow up - reviewed chronic medical issues:  hypertension - the patient reports variable compliance with medication(s) as prescribed. Denies adverse side effects.  dyslipidemia - on statin - the patient reports compliance with medication(s) as prescribed. Denies adverse side effects.  osteoarthritis - R knee - constant swelling and pain, worse with exertion - denies prior injections - takes daily OTC ibuprofen or tylenol for same with decreasing pain relief- no prior ortho eval but "xrays show bone on bone"  Past Medical History  Diagnosis Date  . Arthritis   . Heart murmur   . Hypertension   . Hyperlipidemia   . History of blood transfusion   . Diverticula, colon 1999  . Osteoarthritis of knee     right    Review of Systems  Constitutional: Positive for fatigue. Negative for unexpected weight change.  Respiratory: Negative for cough and shortness of breath.   Psychiatric/Behavioral: Negative for suicidal ideas and self-injury. The patient is nervous/anxious (chronic).        Objective:   Physical Exam BP 128/80  Pulse 80  Temp(Src) 98.7 F (37.1 C) (Oral)  Wt 199 lb 6.4 oz (90.447 kg)  SpO2 98% Wt Readings from Last 3 Encounters:  03/13/13 199 lb 6.4 oz (90.447 kg)  11/21/12 194 lb 1.9 oz (88.052 kg)  08/21/12 191 lb 3.2 oz (86.728 kg)   Constitutional: She is obese, but appears well-developed and well-nourished. No distress.  Neck: Normal range of motion. Neck supple. No JVD present. No thyromegaly present.  Cardiovascular: Normal rate, regular rhythm and normal heart sounds.  No murmur heard. No BLE edema. Pulmonary/Chest: Effort normal and breath sounds normal. No respiratory distress. She has no wheezes.  MSkel: R foot plantar fascititis - pain with palpation but no nodules or foreign body  - r knee - boggy synovitis - tender to palpation over joint  line; FROM and ligamentous function intact Neurological: She is alert and oriented to person, place, and time. No cranial nerve deficit. Coordination normal.  Psychiatric: She has a mildly anxious mood and affect. Her behavior is normal. Judgment and thought content normal.   Lab Results  Component Value Date   WBC 8.6 05/16/2012   HGB 14.1 05/16/2012   HCT 40.9 05/16/2012   PLT 231.0 05/16/2012   GLUCOSE 97 05/26/2012   CHOL 259* 05/16/2012   TRIG 250.0* 05/16/2012   HDL 58.80 05/16/2012   LDLCALC 150* 05/16/2012   ALT 18 05/16/2012   AST 18 05/16/2012   NA 140 05/26/2012   K 4.1 05/26/2012   CL 105 05/26/2012   CREATININE 0.7 05/26/2012   BUN 13 05/26/2012   CO2 28 05/26/2012   TSH 0.60 05/16/2012       Assessment & Plan:   see problem list. Medications and labs reviewed today.  Plantar fasciitis, right - education on dx provided - arch shoe support, ice bottle roll, exercises - change OTC NSAID to meloxicam to miniize GI upset (declines celebrex and naroctics_

## 2013-03-13 NOTE — Assessment & Plan Note (Signed)
Refer to sports med to consider synvisc type injections or other therapy as needed -  she declines steroid injection or ortho refer today (reluctant to consider surg) continue NSAIDs symptomatic relief as ongoing until then

## 2013-03-13 NOTE — Progress Notes (Signed)
Pre-visit discussion using our clinic review tool. No additional management support is needed unless otherwise documented below in the visit note.  

## 2013-03-13 NOTE — Assessment & Plan Note (Signed)
BP Readings from Last 3 Encounters:  03/13/13 128/80  11/21/12 152/90  08/21/12 132/90   Variable control Titrate up amlodipine to max late 03/2012 - inconsistent use because pt feels 10mg  "too strong"  Continue hctz and added beta-blocker 11/2012 - Not taking beta-blocker due to "palpitations" - as BP controlled with decreased stress, DC toprol xl now Off ACE since 04/2012 ER visit due to angioedema side effects  Advised on DASH and need for weight control - diet/exercise to control same

## 2013-03-13 NOTE — Patient Instructions (Addendum)
It was good to see you today.  We have reviewed your prior records including labs and tests today  Test(s) ordered today. Your results will be released to [facility] (or called to you) after review, usually within 72hours after test completion. If any changes need to be made, you will be notified at that same time.  Medications reviewed and updated Stop Toprol (metoprolol) Use meloxicam daily as needed for arthritis pain in place of naprosyn  Your prescription(s) have been submitted to your pharmacy. Please take as directed and contact our office if you believe you are having problem(s) with the medication(s).  Make appointment in 04/2013 with Dr Tamala Julian for your knee to discuss injections as needed  Work on lifestyle changes as discussed (low fat, low carb, increased protein diet; improved exercise efforts; weight loss) to control sugar, blood pressure and cholesterol levels and/or reduce risk of developing other medical problems. Look into [email or web address] or other type of food journal to assist you in this process.  Please schedule followup in 6 months, call sooner if problems.   Plantar Fasciitis Plantar fasciitis is a common condition that causes foot pain. It is soreness (inflammation) of the band of tough fibrous tissue on the bottom of the foot that runs from the heel bone (calcaneus) to the ball of the foot. The cause of this soreness may be from excessive standing, poor fitting shoes, running on hard surfaces, being overweight, having an abnormal walk, or overuse (this is common in runners) of the painful foot or feet. It is also common in aerobic exercise dancers and ballet dancers. SYMPTOMS  Most people with plantar fasciitis complain of:  Severe pain in the morning on the bottom of their foot especially when taking the first steps out of bed. This pain recedes after a few minutes of walking.  Severe pain is experienced also during walking following a long period of  inactivity.  Pain is worse when walking barefoot or up stairs DIAGNOSIS   Your caregiver will diagnose this condition by examining and feeling your foot.  Special tests such as X-rays of your foot, are usually not needed. PREVENTION   Consult a sports medicine professional before beginning a new exercise program.  Walking programs offer a good workout. With walking there is a lower chance of overuse injuries common to runners. There is less impact and less jarring of the joints.  Begin all new exercise programs slowly. If problems or pain develop, decrease the amount of time or distance until you are at a comfortable level.  Wear good shoes and replace them regularly.  Stretch your foot and the heel cords at the back of the ankle (Achilles tendon) both before and after exercise.  Run or exercise on even surfaces that are not hard. For example, asphalt is better than pavement.  Do not run barefoot on hard surfaces.  If using a treadmill, vary the incline.  Do not continue to workout if you have foot or joint problems. Seek professional help if they do not improve. HOME CARE INSTRUCTIONS   Avoid activities that cause you pain until you recover.  Use ice or cold packs on the problem or painful areas after working out.  Only take over-the-counter or prescription medicines for pain, discomfort, or fever as directed by your caregiver.  Soft shoe inserts or athletic shoes with air or gel sole cushions may be helpful.  If problems continue or become more severe, consult a sports medicine caregiver or your own health care provider. Cortisone  is a potent anti-inflammatory medication that may be injected into the painful area. You can discuss this treatment with your caregiver. MAKE SURE YOU:   Understand these instructions.  Will watch your condition.  Will get help right away if you are not doing well or get worse. Document Released: 12/29/2000 Document Revised: 06/28/2011  Document Reviewed: 02/28/2008 [facility] Patient Information 2014 [place], [place].

## 2013-03-21 ENCOUNTER — Telehealth: Payer: Self-pay | Admitting: *Deleted

## 2013-03-21 NOTE — Telephone Encounter (Signed)
Pt called requesting lab results.  Results given as per result note.

## 2013-03-26 ENCOUNTER — Other Ambulatory Visit: Payer: Self-pay | Admitting: Internal Medicine

## 2013-03-26 NOTE — Telephone Encounter (Signed)
Refill done.  

## 2013-03-29 ENCOUNTER — Encounter: Payer: Self-pay | Admitting: Internal Medicine

## 2013-03-29 ENCOUNTER — Ambulatory Visit (INDEPENDENT_AMBULATORY_CARE_PROVIDER_SITE_OTHER): Payer: Medicare Other | Admitting: Internal Medicine

## 2013-03-29 VITALS — BP 120/82 | HR 105 | Temp 100.5°F | Ht 63.0 in | Wt 197.5 lb

## 2013-03-29 DIAGNOSIS — F419 Anxiety disorder, unspecified: Secondary | ICD-10-CM | POA: Insufficient documentation

## 2013-03-29 DIAGNOSIS — F411 Generalized anxiety disorder: Secondary | ICD-10-CM

## 2013-03-29 DIAGNOSIS — I10 Essential (primary) hypertension: Secondary | ICD-10-CM

## 2013-03-29 DIAGNOSIS — J209 Acute bronchitis, unspecified: Secondary | ICD-10-CM

## 2013-03-29 MED ORDER — LEVOFLOXACIN 250 MG PO TABS: 250.0000 mg | ORAL_TABLET | Freq: Every day | ORAL | Status: DC

## 2013-03-29 MED ORDER — HYDROCODONE-HOMATROPINE 5-1.5 MG/5ML PO SYRP: 5.0000 mL | ORAL_SOLUTION | Freq: Four times a day (QID) | ORAL | Status: DC | PRN

## 2013-03-29 NOTE — Patient Instructions (Signed)
Please take all new medication as prescribed Please continue all other medications as before, and refills have been done if requested.  Please remember to sign up for My Chart if you have not done so, as this will be important to you in the future with finding out test results, communicating by private email, and scheduling acute appointments online when needed.

## 2013-03-29 NOTE — Assessment & Plan Note (Signed)
stable overall, and pt to continue medical treatment as before,  to f/u any worsening symptoms or concerns

## 2013-03-29 NOTE — Progress Notes (Signed)
Pre-visit discussion using our clinic review tool. No additional management support is needed unless otherwise documented below in the visit note.  

## 2013-03-29 NOTE — Assessment & Plan Note (Signed)
Mild to mod, for antibx course,  to f/u any worsening symptoms or concerns 

## 2013-03-29 NOTE — Progress Notes (Signed)
Subjective:    Patient ID: Donna Lawson, female    DOB: Nov 15, 1945, 67 y.o.   MRN: [id number]  HPI    Here with acute onset mild to mod 2-3 days ST, HA, general weakness and malaise, with prod cough greenish sputum, but Pt denies chest pain, increased sob or doe, wheezing, orthopnea, PND, increased LE swelling, palpitations, dizziness or syncope.  Pt denies new neurological symptoms such as new headache, or facial or extremity weakness or numbness   Pt denies polydipsia, polyuria Denies worsening depressive symptoms, suicidal ideation, or panic .  Tm was 101 per pt last pm Past Medical History  Diagnosis Date  . Arthritis   . Heart murmur   . Hypertension   . Hyperlipidemia   . History of blood transfusion   . Diverticula, colon 1999  . Osteoarthritis of knee     right   Past Surgical History  Procedure Laterality Date  . No past surgeries      reports that she has quit smoking. She does not have any smokeless tobacco history on file. She reports that she does not drink alcohol or use illicit drugs. family history includes Arthritis in her father and mother; Hypertension in her other. Allergies  Allergen Reactions  . Lisinopril     Tongue swelling   Current Outpatient Prescriptions on File Prior to Visit  Medication Sig Dispense Refill  . ALPRAZolam (XANAX) 0.5 MG tablet Take 1 tablet (0.5 mg total) by mouth 3 (three) times daily as needed for sleep or anxiety.  30 tablet  0  . amLODipine (NORVASC) 10 MG tablet Take 1 tablet (10 mg total) by mouth daily.  90 tablet  0  . amLODipine (NORVASC) 10 MG tablet TAKE ONE-HALF TABLET BY MOUTH TWICE DAILY  90 tablet  1  . atorvastatin (LIPITOR) 20 MG tablet Take 1 tablet (20 mg total) by mouth daily.  90 tablet  1  . famotidine (PEPCID) 20 MG tablet Take 1 tablet (20 mg total) by mouth 2 (two) times daily.  4 tablet  0  . hydrochlorothiazide (HYDRODIURIL) 25 MG tablet Take 1 tablet (25 mg total) by mouth daily.  90 tablet  5  .  meloxicam (MOBIC) 15 MG tablet Take 1 tablet (15 mg total) by mouth daily.  30 tablet  1  . [DISCONTINUED] lisinopril-hydrochlorothiazide (PRINZIDE,ZESTORETIC) 20-25 MG per tablet Take 1 tablet by mouth 2 (two) times daily. Take 1 by mouth daily       No current facility-administered medications on file prior to visit.   Review of Systems  Constitutional: Negative for unexpected weight change, or unusual diaphoresis  HENT: Negative for tinnitus.   Eyes: Negative for photophobia and visual disturbance.  Respiratory: Negative for choking and stridor.   Gastrointestinal: Negative for vomiting and blood in stool.  Genitourinary: Negative for hematuria and decreased urine volume.  Musculoskeletal: Negative for acute joint swelling Skin: Negative for color change and wound.  Neurological: Negative for tremors and numbness other than noted  Psychiatric/Behavioral: Negative for decreased concentration or  hyperactivity.       Objective:   Physical Exam BP 120/82  Pulse 105  Temp(Src) 100.5 F (38.1 C) (Oral)  Ht 5\' 3"  (1.6 m)  Wt 197 lb 8 oz (89.585 kg)  BMI 34.99 kg/m2  SpO2 94% VS noted, mild ill Constitutional: Pt appears well-developed and well-nourished.  HENT: Head: NCAT.  Right Ear: External ear normal.  Left Ear: External ear normal.  Bilat tm's with mild erythema.  Max  sinus areas mild tender.  Pharynx with mild erythema, no exudate Eyes: Conjunctivae and EOM are normal. Pupils are equal, round, and reactive to light.  Neck: Normal range of motion. Neck supple.  Cardiovascular: Normal rate and regular rhythm.   Pulmonary/Chest: Effort normal and breath sounds normal. - no rales or wheezing  Neurological: Pt is alert. Not confused  Skin: Skin is warm. No erythema.  Psychiatric: Pt behavior is normal. Thought content normal. not depressed affect, mild nervous    Assessment & Plan:

## 2013-03-29 NOTE — Assessment & Plan Note (Signed)
stable overall by history and exam, recent data reviewed with pt, and pt to continue medical treatment as before,  to f/u any worsening symptoms or concerns BP Readings from Last 3 Encounters:  03/29/13 120/82  03/13/13 128/80  11/21/12 152/90

## 2013-04-24 ENCOUNTER — Encounter: Payer: Self-pay | Admitting: Family Medicine

## 2013-04-24 ENCOUNTER — Other Ambulatory Visit (INDEPENDENT_AMBULATORY_CARE_PROVIDER_SITE_OTHER): Payer: Medicare HMO

## 2013-04-24 ENCOUNTER — Ambulatory Visit (INDEPENDENT_AMBULATORY_CARE_PROVIDER_SITE_OTHER)
Admission: RE | Admit: 2013-04-24 | Discharge: 2013-04-24 | Disposition: A | Discharge status: Home / Self Care | Payer: Medicare HMO | Source: Ambulatory Visit | Attending: Family Medicine | Admitting: Family Medicine

## 2013-04-24 ENCOUNTER — Ambulatory Visit (INDEPENDENT_AMBULATORY_CARE_PROVIDER_SITE_OTHER): Payer: Medicare HMO | Admitting: Family Medicine

## 2013-04-24 VITALS — BP 144/94 | HR 88

## 2013-04-24 DIAGNOSIS — M25569 Pain in unspecified knee: Secondary | ICD-10-CM

## 2013-04-24 DIAGNOSIS — M25561 Pain in right knee: Secondary | ICD-10-CM

## 2013-04-24 DIAGNOSIS — M1710 Unilateral primary osteoarthritis, unspecified knee: Secondary | ICD-10-CM

## 2013-04-24 NOTE — Patient Instructions (Signed)
Very nice to meet you Get an xray downstairs today, no news is good news Try exercises daily Try the following suggestions Take tylenol 650 mg three times a day is the best evidence based medicine we have for arthritis.  Glucosamine sulfate 750mg  twice a day is a supplement that has been shown to help moderate to severe arthritis. Vitamin D 2000 IU daily Fish oil 2 grams daily.  Tumeric 500mg  twice daily.  Capsaicin topically up to four times a day may also help with pain. Cortisone injections are an option if these interventions do not seem to make a difference or need more relief.  If cortisone injections do not help, there are different types of shots that may help but they take longer to take effect.  We can discuss this at follow up.  It's important that you continue to stay active. Controlling your weight is important.  Consider physical therapy to strengthen muscles around the joint that hurts to take pressure off of the joint itself. Shoe inserts with good arch support may be helpful.  [employer] orthotics at [employer] could help.  Water aerobics and cycling with low resistance are the best two types of exercise for arthritis. Come back and see me in 3 weeks.

## 2013-04-24 NOTE — Assessment & Plan Note (Addendum)
Injected bilateral today with good relief,  Brace placed by me today on right knee Home exercise program was given to her. We did discuss icing protocol as well as he. Discussed over-the-counter medications that can be beneficial. Patient was also given a handout about viscous supplementation in case the pain returns within one month. Patient come back in 3 weeks for further evaluation. She continues to have pain we'll consider sending to formal physical therapy and starting viscous supplementation.

## 2013-04-24 NOTE — Progress Notes (Signed)
Pre-visit discussion using our clinic review tool. No additional management support is needed unless otherwise documented below in the visit note.  

## 2013-04-24 NOTE — Progress Notes (Signed)
I'm seeing this patient by the request  of:  Gwendolyn Grant, MD   CC: Right knee pain  HPI: Patient is a very pleasant 68 year old female with right knee pain. Patient has had this pain for quite some time. Patient states that she seems to be having constant swelling and pain that seems to be getting worse with any type of activity. Patient has been taking over-the-counter ibuprofen and Tylenol with some mild improvement in pain. Patient states she has been evaluated for this previously at an orthopedics office in her previous x-rays did show bone-on-bone arthritis. Patient did have steroid injection in the past multiple years ago that didn't give her some mild improvement. Patient states that this pain can wake her up at night. Patient severity approximately 9 or 10 because it is affecting her daily activities. Patient stopped walking because of the pain which is what she is to do for exercise. Patient also was unable to do her Christmas shopping the shoes secondary to pain.  Past medical, surgical, family and social history reviewed. Medications reviewed all in the electronic medical record.   Review of Systems: No headache, visual changes, nausea, vomiting, diarrhea, constipation, dizziness, abdominal pain, skin rash, fevers, chills, night sweats, weight loss, swollen lymph nodes, body aches, joint swelling, muscle aches, chest pain, shortness of breath, mood changes.   Objective:    Blood pressure 144/94, pulse 88, SpO2 98.00%.   General: No apparent distress alert and oriented x3 mood and affect normal, dressed appropriately.  HEENT: Pupils equal, extraocular movements intact Respiratory: Patient's speak in full sentences and does not appear short of breath Cardiovascular: No lower extremity edema, non tender, no erythema Skin: Warm dry intact with no signs of infection or rash on extremities or on axial skeleton. Abdomen: Soft nontender Neuro: Cranial nerves II through XII are intact,  neurovascularly intact in all extremities with 2+ DTRs and 2+ pulses. Lymph: No lymphadenopathy of posterior or anterior cervical chain or axillae bilaterally.  Gait normal with good balance and coordination.  MSK: Non tender with full range of motion and good stability and symmetric strength and tone of shoulders, elbows, wrist, hip, and ankles bilaterally.  Knee: Bilateral Patient does have osteoarthritic changes of the knees bilaterally more on the medial aspect. Palpation shows significant tenderness over the medial joint line bilaterally right greater than left ROM lacks the last 5 of extension I last 5 of flexion bilaterally Ligaments with solid consistent endpoints including ACL, PCL, LCL, MCL. Negative Mcmurray's, Apley's, and Thessalonian tests. Significant painful patellar compression bilaterally Patellar glide with moderate crepitus. Patellar and quadriceps tendons unremarkable. Hamstring and quadriceps strength is normal.   MSK US performed of: right knee.  This study was ordered, performed, and interpreted by Charlann Boxer D.O.  Knee: All structures visualized. Limited study shows the patient does have significant osteoarthritic changes most severe of the medial joint line with osteophyte formation and severe degenerative changes of the meniscus. Patient's quadriceps and patellar tendon are normal. No effusion noted. IMPRESSION:  Osteoarthritic changes of the knee  After informed written and verbal consent, patient was seated on exam table. Right knee was prepped with alcohol swab and utilizing anterolateral approach, patient's right knee space was injected with 4:1  marcaine 0.5%: Kenalog 40mg /dL. Patient tolerated the procedure well without immediate complications.  After informed written and verbal consent, patient was seated on exam table. Left knee was prepped with alcohol swab and utilizing anterolateral approach, patient's left knee space was injected with 4:1  marcaine  0.5%: Kenalog 40mg /dL. Patient tolerated the procedure well without immediate complications.   Impression and Recommendations:     This case required medical decision making of moderate complexity.

## 2013-05-01 ENCOUNTER — Other Ambulatory Visit: Payer: Self-pay | Admitting: Internal Medicine

## 2013-05-01 DIAGNOSIS — N6099 Unspecified benign mammary dysplasia of unspecified breast: Secondary | ICD-10-CM

## 2013-05-15 ENCOUNTER — Ambulatory Visit (INDEPENDENT_AMBULATORY_CARE_PROVIDER_SITE_OTHER): Payer: Medicare HMO | Admitting: Family Medicine

## 2013-05-15 ENCOUNTER — Encounter: Payer: Self-pay | Admitting: Family Medicine

## 2013-05-15 VITALS — BP 140/82 | HR 80 | Temp 99.7°F | Resp 16 | Wt 194.1 lb

## 2013-05-15 DIAGNOSIS — M1710 Unilateral primary osteoarthritis, unspecified knee: Secondary | ICD-10-CM

## 2013-05-15 NOTE — Assessment & Plan Note (Signed)
Patient does have bilateral osteoarthritis but is responding very well to steroid injection. Encourage her to continue the exercises 3 times a week. Discuss bracing during the day more than at night. Discuss continuing icing. Patient would be a candidate for viscous supplementation the with her continuing to do well I would like to hold off on starting a series unless her pain comes back less than 3 months from a steroid injection. Discuss potential formal physical therapy which patient declined as well. Patient will follow up again in 2 months for further evaluation. Spent greater than 25 minutes with patient face-to-face and had greater than 50% of counseling including as described above in assessment and plan.

## 2013-05-15 NOTE — Progress Notes (Signed)
  CC: Bilateral knee pain followup  HPI: Patient is here for followup of her bilateral knee pain. Patient was given steroid injections in both knees as well as given a home exercise program. Patient states that she is approximately 95% better. Patient states that from time to time she has a twinging sensation they can give her pain but seems to resolve after seconds. Patient is wearing a knee brace that was given to her more at night than during the day. Patient still does he icing which has been helpful as well. Patient is very happy with the results so far.   Past medical, surgical, family and social history reviewed. Medications reviewed all in the electronic medical record.   Review of Systems: No headache, visual changes, nausea, vomiting, diarrhea, constipation, dizziness, abdominal pain, skin rash, fevers, chills, night sweats, weight loss, swollen lymph nodes, body aches, joint swelling, muscle aches, chest pain, shortness of breath, mood changes.   Objective:    Blood pressure 140/82, pulse 80, temperature 99.7 F (37.6 C), temperature source Oral, resp. rate 16, weight 194 lb 1.3 oz (88.034 kg), SpO2 91.00%.   General: No apparent distress alert and oriented x3 mood and affect normal, dressed appropriately.  HEENT: Pupils equal, extraocular movements intact Respiratory: Patient's speak in full sentences and does not appear short of breath Cardiovascular: No lower extremity edema, non tender, no erythema Skin: Warm dry intact with no signs of infection or rash on extremities or on axial skeleton. Abdomen: Soft nontender Neuro: Cranial nerves II through XII are intact, neurovascularly intact in all extremities with 2+ DTRs and 2+ pulses. Lymph: No lymphadenopathy of posterior or anterior cervical chain or axillae bilaterally.  Gait normal with good balance and coordination.  MSK: Non tender with full range of motion and good stability and symmetric strength and tone of shoulders,  elbows, wrist, hip, and ankles bilaterally.  Knee: Bilateral Patient does have osteoarthritic changes of the knees bilaterally more on the medial aspect. Still mild tenderness to palpation over the medial joint line bilaterally ROM lacks the last 5 of extension I last 5 of flexion bilaterally Ligaments with solid consistent endpoints including ACL, PCL, LCL, MCL. Negative Mcmurray's, Apley's, and Thessalonian tests. Improve painful patellar compression bilaterally Patellar glide with moderate crepitus. Patellar and quadriceps tendons unremarkable. Hamstring and quadriceps strength is normal.    Impression and Recommendations:     This case required medical decision making of moderate complexity.

## 2013-05-15 NOTE — Patient Instructions (Signed)
Great to see you Keep up the good work Exercises 3 times a week We can repeat the steroid shot every 3 months.  Consider coming back again in 2 months.

## 2013-05-15 NOTE — Progress Notes (Signed)
Pre-visit discussion using our clinic review tool. No additional management support is needed unless otherwise documented below in the visit note.  

## 2013-06-04 ENCOUNTER — Ambulatory Visit
Admission: RE | Admit: 2013-06-04 | Discharge: 2013-06-04 | Disposition: A | Discharge status: Home / Self Care | Payer: Medicare HMO | Source: Ambulatory Visit | Attending: Internal Medicine | Admitting: Internal Medicine

## 2013-06-04 DIAGNOSIS — N6099 Unspecified benign mammary dysplasia of unspecified breast: Secondary | ICD-10-CM

## 2013-10-31 ENCOUNTER — Telehealth: Payer: Self-pay | Admitting: Internal Medicine

## 2013-10-31 MED ORDER — AMLODIPINE BESYLATE 10 MG PO TABS: ORAL_TABLET | ORAL | Status: DC

## 2013-10-31 NOTE — Telephone Encounter (Signed)
Yes - please refill so long as pt has scheduled OV in future -

## 2013-10-31 NOTE — Telephone Encounter (Signed)
Pt was wondering if Dr. Asa Lente will give her some more Amlodipine until she come in for her appt on 11/15/13. Please advise.

## 2013-11-15 ENCOUNTER — Ambulatory Visit (INDEPENDENT_AMBULATORY_CARE_PROVIDER_SITE_OTHER): Payer: Medicare HMO | Admitting: Internal Medicine

## 2013-11-15 ENCOUNTER — Encounter: Payer: Self-pay | Admitting: Internal Medicine

## 2013-11-15 VITALS — BP 136/72 | HR 78 | Temp 98.5°F | Resp 16 | Ht 63.0 in | Wt 186.1 lb

## 2013-11-15 DIAGNOSIS — E785 Hyperlipidemia, unspecified: Secondary | ICD-10-CM

## 2013-11-15 DIAGNOSIS — I10 Essential (primary) hypertension: Secondary | ICD-10-CM

## 2013-11-15 DIAGNOSIS — M1710 Unilateral primary osteoarthritis, unspecified knee: Secondary | ICD-10-CM

## 2013-11-15 DIAGNOSIS — M1711 Unilateral primary osteoarthritis, right knee: Secondary | ICD-10-CM

## 2013-11-15 MED ORDER — AMLODIPINE BESYLATE 10 MG PO TABS: 10.0000 mg | ORAL_TABLET | Freq: Every day | ORAL | Status: DC

## 2013-11-15 NOTE — Patient Instructions (Signed)
It was good to see you today.  We have reviewed your prior records including labs and tests today  Medications reviewed and updated, no changes recommended at this time. Refill on medication(s) as discussed today.  Continue working with Dr Tamala Julian and let me know if referral to orthopedist is desired . Our office will contact you regarding appointment(s) once made.  Please schedule followup in 4-6 months, call sooner if problems.

## 2013-11-15 NOTE — Progress Notes (Signed)
Pre visit review using our clinic review tool, if applicable. No additional management support is needed unless otherwise documented below in the visit note. 

## 2013-11-15 NOTE — Assessment & Plan Note (Signed)
eval by sports med 04/2013 with steroid IA - improved encouraed to follow up as for recurrent symptoms to repeat or consider synvisc type injections as needed -   Reports family has encouraged her to see orthopedist, but declines ortho refer today (reluctant to consider surg) continue NSAIDs symptomatic relief as ongoing

## 2013-11-15 NOTE — Progress Notes (Signed)
Subjective:    Patient ID: Donna Lawson, female    DOB: June 26, 1945, 68 y.o.   MRN: [id number]  HPI   Here for follow up - reviewed chronic medical issues and interval medical events  Past Medical History  Diagnosis Date  . Arthritis   . Heart murmur   . Hypertension   . Hyperlipidemia   . History of blood transfusion   . Diverticula, colon 1999  . Osteoarthritis of knee     right    Review of Systems  Constitutional: Negative for fever, fatigue and unexpected weight change.  Respiratory: Negative for cough, shortness of breath and wheezing.   Cardiovascular: Negative for chest pain, palpitations and leg swelling.  Gastrointestinal: Negative for nausea, abdominal pain and diarrhea.  Musculoskeletal: Positive for arthralgias. Negative for joint swelling.  Neurological: Negative for dizziness, facial asymmetry, weakness, light-headedness and headaches.  Psychiatric/Behavioral: Negative for dysphoric mood. The patient is not nervous/anxious.   All other systems reviewed and are negative.      Objective:   Physical Exam  BP 136/72  Pulse 78  Temp(Src) 98.5 F (36.9 C) (Oral)  Resp 16  Ht 5\' 3"  (1.6 m)  Wt 186 lb 1.3 oz (84.405 kg)  BMI 32.97 kg/m2  SpO2 96% Wt Readings from Last 3 Encounters:  11/15/13 186 lb 1.3 oz (84.405 kg)  05/15/13 194 lb 1.3 oz (88.034 kg)  03/29/13 197 lb 8 oz (89.585 kg)   Constitutional: She is overweight, appears well-developed and well-nourished. No distress.  Neck: Normal range of motion. Neck supple. No JVD present. No thyromegaly present.  Cardiovascular: Normal rate, regular rhythm and normal heart sounds.  No murmur heard. No BLE edema. Pulmonary/Chest: Effort normal and breath sounds normal. No respiratory distress. She has no wheezes.  Musculoskeletal: R knee - boggy synovitis - tender to palpation over joint line; FROM and ligamentous function intact Normal range of motion, no joint effusions. No gross deformities Skin:  Skin is warm and dry. No rash noted. No erythema.  Psychiatric: She has a normal mood and affect. Her behavior is normal. Judgment and thought content normal.      Lab Results  Component Value Date   WBC 8.6 05/16/2012   HGB 14.1 05/16/2012   HCT 40.9 05/16/2012   PLT 231.0 05/16/2012   GLUCOSE 106* 03/13/2013   CHOL 206* 03/13/2013   TRIG 118.0 03/13/2013   HDL 67.90 03/13/2013   LDLDIRECT 119.2 03/13/2013   LDLCALC 150* 05/16/2012   ALT 15 03/13/2013   AST 16 03/13/2013   NA 142 03/13/2013   K 3.4* 03/13/2013   CL 104 03/13/2013   CREATININE 0.6 03/13/2013   BUN 15 03/13/2013   CO2 31 03/13/2013   TSH 0.60 05/16/2012    Mm Digital Diagnostic Unilat R  06/04/2013   CLINICAL DATA:  Patient for 6 month follow-up after benign right breast biopsy.  EXAM: DIGITAL DIAGNOSTIC  RIGHT MAMMOGRAM WITH CAD  COMPARISON:  Prior examinations dating back to 10/02/2012.  ACR Breast Density Category b: There are scattered areas of fibroglandular density.  FINDINGS: Biopsy marking clip within the upper-outer right breast anterior depth stable in position. No concerning masses, calcifications or architectural distortion identified.  Mammographic images were processed with CAD.  IMPRESSION: No mammographic evidence for malignancy. No significant interval change in parenchymal pattern right breast after benign right breast biopsy.  RECOMMENDATION: Return to bilateral annual screening mammography July 2015.  I have discussed the findings and recommendations with the patient. Results were also  provided in writing at the conclusion of the visit. If applicable, a reminder letter will be sent to the patient regarding the next appointment.  BI-RADS CATEGORY  1: Negative.   Electronically Signed   By: Lovey Newcomer M.D.   On: 06/04/2013 12:39       Assessment & Plan:   Problem List Items Addressed This Visit   Hyperlipidemia     previously rx'd atorvastatin, prava and simva - stopped due to myalgias and feared side  effects  Reviewed last lipid profile - rx atorva 08/2012 but not started until 11/2012 and variable compliance Check annually - encouraged to take as tolerable      Relevant Medications      amLODIpine (NORVASC) tablet   Hypertension - Primary      BP Readings from Last 3 Encounters:  11/15/13 136/72  05/15/13 140/82  04/24/13 144/94   Variable control Titrate up amlodipine to max late 03/2012 - inconsistent use because pt feels 10mg  "too strong"  Continue hctz and added beta-blocker 11/2012 - Not taking beta-blocker due to "palpitations" - as BP controlled with decreased stress, DC toprol xl now Off ACE since 04/2012 ER visit due to angioedema side effects  continue DASH and weight reduction with diet/exercise to control same     Relevant Medications      amLODIpine (NORVASC) tablet   Osteoarthritis of knee     eval by sports med 04/2013 with steroid IA - improved encouraed to follow up as for recurrent symptoms to repeat or consider synvisc type injections as needed -   Reports family has encouraged her to see orthopedist, but declines ortho refer today (reluctant to consider surg) continue NSAIDs symptomatic relief as ongoing

## 2013-11-15 NOTE — Assessment & Plan Note (Signed)
BP Readings from Last 3 Encounters:  11/15/13 136/72  05/15/13 140/82  04/24/13 144/94   Variable control Titrate up amlodipine to max late 03/2012 - inconsistent use because pt feels 10mg  "too strong"  Continue hctz and added beta-blocker 11/2012 - Not taking beta-blocker due to "palpitations" - as BP controlled with decreased stress, DC toprol xl now Off ACE since 04/2012 ER visit due to angioedema side effects  continue DASH and weight reduction with diet/exercise to control same

## 2013-11-15 NOTE — Assessment & Plan Note (Signed)
previously rx'd atorvastatin, prava and simva - stopped due to myalgias and feared side effects  Reviewed last lipid profile - rx atorva 08/2012 but not started until 11/2012 and variable compliance Check annually - encouraged to take as tolerable

## 2013-12-11 ENCOUNTER — Other Ambulatory Visit: Payer: Self-pay | Admitting: Internal Medicine

## 2013-12-19 ENCOUNTER — Other Ambulatory Visit: Payer: Self-pay

## 2013-12-19 DIAGNOSIS — Z1231 Encounter for screening mammogram for malignant neoplasm of breast: Secondary | ICD-10-CM

## 2014-01-03 ENCOUNTER — Ambulatory Visit
Admission: RE | Admit: 2014-01-03 | Discharge: 2014-01-03 | Disposition: A | Discharge status: Home / Self Care | Payer: Medicare HMO | Source: Ambulatory Visit

## 2014-01-03 DIAGNOSIS — Z1231 Encounter for screening mammogram for malignant neoplasm of breast: Secondary | ICD-10-CM

## 2014-04-15 ENCOUNTER — Other Ambulatory Visit: Payer: Self-pay | Admitting: Internal Medicine

## 2014-04-18 ENCOUNTER — Ambulatory Visit: Payer: Medicare HMO | Admitting: Family Medicine

## 2014-06-06 ENCOUNTER — Ambulatory Visit (INDEPENDENT_AMBULATORY_CARE_PROVIDER_SITE_OTHER): Payer: Medicare HMO | Admitting: Family Medicine

## 2014-06-06 ENCOUNTER — Encounter: Payer: Self-pay | Admitting: Family Medicine

## 2014-06-06 VITALS — BP 126/84 | HR 93 | Ht 63.0 in | Wt 186.0 lb

## 2014-06-06 DIAGNOSIS — M1710 Unilateral primary osteoarthritis, unspecified knee: Secondary | ICD-10-CM

## 2014-06-06 NOTE — Progress Notes (Signed)
Pre visit review using our clinic review tool, if applicable. No additional management support is needed unless otherwise documented below in the visit note. 

## 2014-06-06 NOTE — Assessment & Plan Note (Signed)
Was given bilateral steroid injections today. We discussed icing protocol, home exercises, and the importance of weight loss. Patient did work with Product/process development scientist today. Patient given a trial topical anti-inflammatory. Patient and will come back and see me again in 3 weeks. Patient would be a candidate for viscous supplementation

## 2014-06-06 NOTE — Progress Notes (Signed)
  CC: Bilateral knee pain followup  HPI: Patient is here for followup of her bilateral knee pain. Patient was given steroid injections in both knees as well as given a home exercise program. Patient was last seen greater than one year ago. Patient states overall she started having worsening pain over the course last 3 or 4 months. Patient states it is difficult to even do her exercises now. Patient is not taking the medicine and not doing the exercises on a regular basis. States that most of the pain still is on the medial aspect of the knees. Patient's previous x-rays that show moderate to severe osteophytic changes of the medial joint line.  Past medical, surgical, family and social history reviewed. Medications reviewed all in the electronic medical record.   Review of Systems: No headache, visual changes, nausea, vomiting, diarrhea, constipation, dizziness, abdominal pain, skin rash, fevers, chills, night sweats, weight loss, swollen lymph nodes, body aches, joint swelling, muscle aches, chest pain, shortness of breath, mood changes.   Objective:    Blood pressure 126/84, pulse 93, height 5\' 3"  (1.6 m), weight 186 lb (84.369 kg), SpO2 96 %.   General: No apparent distress alert and oriented x3 mood and affect normal, dressed appropriately.  HEENT: Pupils equal, extraocular movements intact Respiratory: Patient's speak in full sentences and does not appear short of breath Cardiovascular: No lower extremity edema, non tender, no erythema Skin: Warm dry intact with no signs of infection or rash on extremities or on axial skeleton. Abdomen: Soft nontender Neuro: Cranial nerves II through XII are intact, neurovascularly intact in all extremities with 2+ DTRs and 2+ pulses. Lymph: No lymphadenopathy of posterior or anterior cervical chain or axillae bilaterally.  Gait normal with good balance and coordination.  MSK: Non tender with full range of motion and good stability and symmetric strength and  tone of shoulders, elbows, wrist, hip, and ankles bilaterally.  Knee: Bilateral Patient does have osteoarthritic changes of the knees bilaterally more on the medial aspect. Moderate tenderness over the medial joint line ROM lacks the last 5 of extension I last 5 of flexion bilaterally Ligaments with solid consistent endpoints including ACL, PCL, LCL, MCL. Negative Mcmurray's, Apley's, and Thessalonian tests. Improve painful patellar compression bilaterally Patellar glide with moderate crepitus. Patellar and quadriceps tendons unremarkable. Hamstring and quadriceps strength is normal.   After informed written and verbal consent, patient was seated on exam table. Right knee was prepped with alcohol swab and utilizing anterolateral approach, patient's right knee space was injected with 4:1  marcaine 0.5%: Kenalog 40mg /dL. Patient tolerated the procedure well without immediate complications.  After informed written and verbal consent, patient was seated on exam table. Left knee was prepped with alcohol swab and utilizing anterolateral approach, patient's left knee space was injected with 4:1  marcaine 0.5%: Kenalog 40mg /dL. Patient tolerated the procedure well without immediate complications.    Impression and Recommendations:     This case required medical decision making of moderate complexity.

## 2014-06-06 NOTE — Patient Instructions (Signed)
Good to see you Ice 20 minutes 2 times daily. Usually after activity and before bed. Try exercises 3 times a week Try the pennsaid twice daily Turmeric 500mg  twice daily can help.  See me again in 3-4 weeks.

## 2014-06-28 ENCOUNTER — Telehealth: Payer: Self-pay

## 2014-07-01 NOTE — Telephone Encounter (Signed)
Status of flu vaccine unknown; Unable to reach the mbr.

## 2014-11-04 ENCOUNTER — Telehealth: Payer: Self-pay | Admitting: Internal Medicine

## 2014-11-04 MED ORDER — MELOXICAM 15 MG PO TABS: 15.0000 mg | ORAL_TABLET | Freq: Every day | ORAL | Status: DC

## 2014-11-04 NOTE — Telephone Encounter (Signed)
erx for meloxicam. Tried to call pt back. No vm to leave a message. 02/25/15 is the first available appt with dr. Asa Lente.

## 2014-11-04 NOTE — Telephone Encounter (Signed)
Patient has scheduled cpe for 11/8.  She is requesting refill on meloxicam.  Patient was wanting to come in before 11/8 to see Dr. Asa Lente to discuss her refills.  Patient did not want to see another provider in regards.  Patient is requesting a call back.

## 2014-11-07 ENCOUNTER — Telehealth: Payer: Self-pay | Admitting: Internal Medicine

## 2014-11-07 NOTE — Telephone Encounter (Signed)
Patient ask if you could send a prescription for  meloxicam (MOBIC) 15 MG tablet  [id number] [place] [address] [place] - [place] [phone] (Phone) [phone] (Fax)

## 2014-11-07 NOTE — Telephone Encounter (Signed)
erx to [facility] on [address] done on 11/04/14

## 2014-11-08 NOTE — Telephone Encounter (Signed)
Called patient to let her know medication was already sent, phone # was invalid.

## 2014-11-28 ENCOUNTER — Ambulatory Visit: Payer: Medicare HMO | Admitting: Internal Medicine

## 2014-12-02 ENCOUNTER — Other Ambulatory Visit: Payer: Self-pay | Admitting: Internal Medicine

## 2014-12-05 ENCOUNTER — Telehealth: Payer: Self-pay

## 2014-12-05 NOTE — Telephone Encounter (Signed)
Call to introduce AWV; no answer / could not leave a message

## 2015-02-19 ENCOUNTER — Other Ambulatory Visit: Payer: Self-pay

## 2015-02-19 DIAGNOSIS — Z1231 Encounter for screening mammogram for malignant neoplasm of breast: Secondary | ICD-10-CM

## 2015-02-25 ENCOUNTER — Other Ambulatory Visit (INDEPENDENT_AMBULATORY_CARE_PROVIDER_SITE_OTHER): Payer: Medicare HMO

## 2015-02-25 ENCOUNTER — Encounter: Payer: Self-pay | Admitting: Internal Medicine

## 2015-02-25 ENCOUNTER — Ambulatory Visit (INDEPENDENT_AMBULATORY_CARE_PROVIDER_SITE_OTHER): Payer: Medicare HMO | Admitting: Internal Medicine

## 2015-02-25 VITALS — BP 128/84 | HR 78 | Temp 97.9°F | Ht 63.0 in | Wt 191.2 lb

## 2015-02-25 DIAGNOSIS — Z23 Encounter for immunization: Secondary | ICD-10-CM | POA: Diagnosis not present

## 2015-02-25 DIAGNOSIS — E785 Hyperlipidemia, unspecified: Secondary | ICD-10-CM

## 2015-02-25 DIAGNOSIS — Z0000 Encounter for general adult medical examination without abnormal findings: Secondary | ICD-10-CM

## 2015-02-25 DIAGNOSIS — I10 Essential (primary) hypertension: Secondary | ICD-10-CM

## 2015-02-25 DIAGNOSIS — M170 Bilateral primary osteoarthritis of knee: Secondary | ICD-10-CM

## 2015-02-25 DIAGNOSIS — E669 Obesity, unspecified: Secondary | ICD-10-CM | POA: Diagnosis not present

## 2015-02-25 DIAGNOSIS — F419 Anxiety disorder, unspecified: Secondary | ICD-10-CM

## 2015-02-25 LAB — BASIC METABOLIC PANEL
BUN: 12 mg/dL (ref 6–23)
CO2: 30 meq/L (ref 19–32)
Calcium: 10.4 mg/dL (ref 8.4–10.5)
Chloride: 101 mEq/L (ref 96–112)
Creatinine, Ser: 0.67 mg/dL (ref 0.40–1.20)
GFR: 92.26 mL/min (ref >60.00)
GLUCOSE: 112 mg/dL — AB (ref 70–99)
Potassium: 3.3 mEq/L — ABNORMAL LOW (ref 3.5–5.1)
SODIUM: 143 meq/L (ref 135–145)

## 2015-02-25 LAB — CBC WITH DIFFERENTIAL/PLATELET
Basophils Absolute: 0 10*3/uL (ref 0.0–0.1)
Basophils Relative: 0.6 % (ref 0.0–3.0)
EOS PCT: 2.3 % (ref 0.0–5.0)
Eosinophils Absolute: 0.2 10*3/uL (ref 0.0–0.7)
HCT: 41.3 % (ref 36.0–46.0)
Hemoglobin: 14.2 g/dL (ref 12.0–15.0)
LYMPHS ABS: 1.5 10*3/uL (ref 0.7–4.0)
Lymphocytes Relative: 19.9 % (ref 12.0–46.0)
MCHC: 34.3 g/dL (ref 30.0–36.0)
MCV: 83.2 fl (ref 78.0–100.0)
MONOS PCT: 6.8 % (ref 3.0–12.0)
Monocytes Absolute: 0.5 10*3/uL (ref 0.1–1.0)
NEUTROS PCT: 70.4 % (ref 43.0–77.0)
Neutro Abs: 5.5 10*3/uL (ref 1.4–7.7)
Platelets: 226 10*3/uL (ref 150.0–400.0)
RBC: 4.96 Mil/uL (ref 3.87–5.11)
RDW: 13.7 % (ref 11.5–15.5)
WBC: 7.8 10*3/uL (ref 4.0–10.5)

## 2015-02-25 LAB — LIPID PANEL
CHOLESTEROL: 240 mg/dL — AB (ref 0–200)
HDL: 64.1 mg/dL (ref >39.00)
LDL Cholesterol: 139 mg/dL — ABNORMAL HIGH (ref 0–99)
NonHDL: 175.62
Total CHOL/HDL Ratio: 4
Triglycerides: 183 mg/dL — ABNORMAL HIGH (ref 0.0–149.0)
VLDL: 36.6 mg/dL (ref 0.0–40.0)

## 2015-02-25 LAB — HEPATIC FUNCTION PANEL
ALBUMIN: 4.8 g/dL (ref 3.5–5.2)
ALT: 11 U/L (ref 0–35)
AST: 14 U/L (ref 0–37)
Alkaline Phosphatase: 102 U/L (ref 39–117)
Bilirubin, Direct: 0.1 mg/dL (ref 0.0–0.3)
Total Bilirubin: 0.7 mg/dL (ref 0.2–1.2)
Total Protein: 7.7 g/dL (ref 6.0–8.3)

## 2015-02-25 LAB — TSH: TSH: 1.23 u[IU]/mL (ref 0.35–4.50)

## 2015-02-25 MED ORDER — HYDROCHLOROTHIAZIDE 25 MG PO TABS: 25.0000 mg | ORAL_TABLET | Freq: Every day | ORAL | Status: DC

## 2015-02-25 MED ORDER — ALPRAZOLAM 0.5 MG PO TABS: 0.5000 mg | ORAL_TABLET | Freq: Three times a day (TID) | ORAL | Status: DC | PRN

## 2015-02-25 MED ORDER — AMLODIPINE BESYLATE 10 MG PO TABS: 10.0000 mg | ORAL_TABLET | Freq: Every day | ORAL | Status: DC

## 2015-02-25 MED ORDER — ATORVASTATIN CALCIUM 20 MG PO TABS
20.0000 mg | ORAL_TABLET | Freq: Every day | ORAL | Status: DC
Start: 2015-02-25 — End: 2017-02-25

## 2015-02-25 NOTE — Assessment & Plan Note (Signed)
BP Readings from Last 3 Encounters:  02/25/15 128/84  06/06/14 126/84  11/15/13 136/72   Titrate up amlodipine to max late 03/2012 - inconsistent use because pt feels 10mg  "too strong"  Continue hctz added beta-blocker 11/2012, but as BP controlled with decreased stress, DC'd toprol xl 7/15 Off ACE since 04/2012 ER visit due to angioedema side effects  continue DASH and weight reduction with diet/exercise to control same

## 2015-02-25 NOTE — Assessment & Plan Note (Signed)
Wt Readings from Last 3 Encounters:  02/25/15 191 lb 4 oz (86.75 kg)  06/06/14 186 lb (84.369 kg)  11/15/13 186 lb 1.3 oz (84.405 kg)  Body mass index is 33.89 kg/(m^2).  The patient is asked to make an attempt to improve diet and exercise patterns to aid in medical management of this problem. Reviewed effect of weight on blood pressure, lipids and osteoarthritis mgmt

## 2015-02-25 NOTE — Progress Notes (Signed)
Subjective:    Patient ID: Donna Lawson, female    DOB: [birth year], 69 y.o.   MRN: [id number]  HPI   Here for medicare wellness  Diet: heart healthy  Physical activity: sedentary Depression/mood screen: negative Hearing: intact to whispered voice Visual acuity: grossly normal, performs annual eye exam  ADLs: capable Fall risk: none Home safety: good Cognitive evaluation: intact to orientation, naming, recall and repetition EOL planning: adv directives, full code/ I agree  I have personally reviewed and have noted 1. The patient's medical and social history 2. Their use of alcohol, tobacco or illicit drugs 3. Their current medications and supplements 4. The patient's functional ability including ADL's, fall risks, home safety risks and hearing or visual impairment. 5. Diet and physical activities 6. Evidence for depression or mood disorders  Also reviewed chronic medical conditions, interval events and current concerns   Past Medical History  Diagnosis Date  . Arthritis   . Heart murmur   . Hypertension   . Hyperlipidemia   . History of blood transfusion   . Diverticula, colon 1999  . Osteoarthritis of knee     right   Family History  Problem Relation Age of Onset  . Arthritis Mother   . Arthritis Father   . Hypertension Other    Social History  Substance Use Topics  . Smoking status: Former Research scientist (life sciences)  . Smokeless tobacco: None  . Alcohol Use: No    Review of Systems  Constitutional: Negative for fatigue and unexpected weight change.  Respiratory: Negative for cough, shortness of breath and wheezing.   Cardiovascular: Negative for chest pain, palpitations and leg swelling.  Gastrointestinal: Negative for nausea, abdominal pain and diarrhea.  Neurological: Negative for dizziness, weakness, light-headedness and headaches.  Psychiatric/Behavioral: Negative for dysphoric mood. The patient is not nervous/anxious.   All other systems reviewed and are  negative.   Patient Care Team: Rowe Clack, MD as PCP - General (Internal Medicine) Lyndal Pulley, DO (Sports Medicine)     Objective:    Physical Exam  Constitutional: She appears well-developed and well-nourished. No distress.  obese  Cardiovascular: Normal rate, regular rhythm and normal heart sounds.   No murmur heard. Pulmonary/Chest: Effort normal and breath sounds normal. No respiratory distress.  Musculoskeletal: She exhibits no edema.  Vitals reviewed.   BP 128/84 mmHg  Pulse 78  Temp(Src) 97.9 F (36.6 C) (Oral)  Ht 5\' 3"  (1.6 m)  Wt 191 lb 4 oz (86.75 kg)  BMI 33.89 kg/m2  SpO2 93% Wt Readings from Last 3 Encounters:  02/25/15 191 lb 4 oz (86.75 kg)  06/06/14 186 lb (84.369 kg)  11/15/13 186 lb 1.3 oz (84.405 kg)    Lab Results  Component Value Date   WBC 8.6 05/16/2012   HGB 14.1 05/16/2012   HCT 40.9 05/16/2012   PLT 231.0 05/16/2012   GLUCOSE 106* 03/13/2013   CHOL 206* 03/13/2013   TRIG 118.0 03/13/2013   HDL 67.90 03/13/2013   LDLDIRECT 119.2 03/13/2013   LDLCALC 150* 05/16/2012   ALT 15 03/13/2013   AST 16 03/13/2013   NA 142 03/13/2013   K 3.4* 03/13/2013   CL 104 03/13/2013   CREATININE 0.6 03/13/2013   BUN 15 03/13/2013   CO2 31 03/13/2013   TSH 0.60 05/16/2012    Mm Digital Screening Bilateral  01/03/2014  CLINICAL DATA:  Screening. EXAM: DIGITAL SCREENING BILATERAL MAMMOGRAM WITH CAD COMPARISON:  Previous exam(s). ACR Breast Density Category b: There are scattered areas of fibroglandular  density. FINDINGS: There are no findings suspicious for malignancy. Images were processed with CAD. IMPRESSION: No mammographic evidence of malignancy. A result letter of this screening mammogram will be mailed directly to the patient. RECOMMENDATION: Screening mammogram in one year. (Code:SM-B-01Y) BI-RADS CATEGORY  1: Negative. Electronically Signed   By: Lajean Manes M.D.   On: 01/03/2014 14:52       Assessment & Plan:   AWV/z00.00 -  Today patient counseled on age appropriate routine health concerns for screening and prevention, each reviewed and up to date or declined. Immunizations reviewed and up to date or declined. Labs ordered and reviewed. Risk factors for depression reviewed and negative. Hearing function and visual acuity are intact. ADLs screened and addressed as needed. Functional ability and level of safety reviewed and appropriate. Education, counseling and referrals performed based on assessed risks today. Patient provided with a copy of personalized plan for preventive services.  Pt declines pneumonia and zoster immunization(s), colonoscopy screening, bone density, but does agree to annual flu immunization today. Mammogram scheduled November 30  Problem List Items Addressed This Visit    Anxiety    Uses Xanax as needed. Refill provided today      Relevant Medications   ALPRAZolam (XANAX) 0.5 MG tablet   Hyperlipidemia    previously rx'd atorvastatin, prava and simva - stopped due to myalgias and feared side effects  Reviewed last lipid profile - rx atorva 08/2012 but not started until 11/2012 and variable compliance Check annually - encouraged to take as tolerable       Relevant Medications   amLODipine (NORVASC) 10 MG tablet   hydrochlorothiazide (HYDRODIURIL) 25 MG tablet   atorvastatin (LIPITOR) 20 MG tablet   Hypertension    BP Readings from Last 3 Encounters:  02/25/15 128/84  06/06/14 126/84  11/15/13 136/72   Titrate up amlodipine to max late 03/2012 - inconsistent use because pt feels 10mg  "too strong"  Continue hctz added beta-blocker 11/2012, but as BP controlled with decreased stress, DC'd toprol xl 7/15 Off ACE since 04/2012 ER visit due to angioedema side effects  continue DASH and weight reduction with diet/exercise to control same      Relevant Medications   amLODipine (NORVASC) 10 MG tablet   hydrochlorothiazide (HYDRODIURIL) 25 MG tablet   atorvastatin (LIPITOR) 20 MG tablet    Obese    Wt Readings from Last 3 Encounters:  02/25/15 191 lb 4 oz (86.75 kg)  06/06/14 186 lb (84.369 kg)  11/15/13 186 lb 1.3 oz (84.405 kg)  Body mass index is 33.89 kg/(m^2).  The patient is asked to make an attempt to improve diet and exercise patterns to aid in medical management of this problem. Reviewed effect of weight on blood pressure, lipids and osteoarthritis mgmt      Osteoarthritis of knee    eval by sports med 04/2013 with steroid IA - initially improved but repeat IA steroids B knees 05/2014 less effective encouraged to follow up at this time for recurrent symptoms to repeat or consider synvisc type injections as needed   Reports family has encouraged her to see orthopedist, and I concur so will make ortho refer today (pt still reluctant to consider surg) continue NSAIDs symptomatic relief as ongoing       Relevant Orders   Ambulatory referral to Orthopedic Surgery    Other Visit Diagnoses    Routine general medical examination at a health care facility    -  Primary    Relevant Orders  Basic metabolic panel    CBC with Differential/Platelet    Hepatic function panel    Lipid panel    TSH    Urinalysis, Routine w reflex microscopic (not at [facility])        Gwendolyn Grant, MD

## 2015-02-25 NOTE — Assessment & Plan Note (Signed)
Uses Xanax as needed. Refill provided today

## 2015-02-25 NOTE — Assessment & Plan Note (Signed)
eval by sports med 04/2013 with steroid IA - initially improved but repeat IA steroids B knees 05/2014 less effective encouraged to follow up at this time for recurrent symptoms to repeat or consider synvisc type injections as needed   Reports family has encouraged her to see orthopedist, and I concur so will make ortho refer today (pt still reluctant to consider surg) continue NSAIDs symptomatic relief as ongoing

## 2015-02-25 NOTE — Patient Instructions (Addendum)
It was good to see you today.  We have reviewed your prior records including labs and tests today  Annual flu shot updated today. Other Health Maintenance reviewed - all recommended immunizations and age-appropriate screenings are up-to-date or declined. Please let us know if you change your mind about bone density screening, shingles vaccine, pneumonia vaccines or colon screening.  Test(s) ordered today. Your results will be released to [place] (or called to you) after review, usually within 72hours after test completion. If any changes need to be made, you will be notified at that same time.  Medications reviewed and updated, no changes recommended at this time.  We will make referral to orthopedics for evaluation and treatment of your knees as discussed. My office will call you regarding this referral  Plan follow-up in 6 months with new primary care physician. I have reached out to Dr. Nicki Reaper and we will notify you about this appointment once arranged. Please call sooner at this office if any questions or concerns  Health Maintenance, Female Adopting a healthy lifestyle and getting preventive care can go a long way to promote health and wellness. Talk with your health care provider about what schedule of regular examinations is right for you. This is a good chance for you to check in with your provider about disease prevention and staying healthy. In between checkups, there are plenty of things you can do on your own. Experts have done a lot of research about which lifestyle changes and preventive measures are most likely to keep you healthy. Ask your health care provider for more information. WEIGHT AND DIET  Eat a healthy diet  Be sure to include plenty of vegetables, fruits, low-fat dairy products, and lean protein.  Do not eat a lot of foods high in solid fats, added sugars, or salt.  Get regular exercise. This is one of the most important things you can do for your health.  Most  adults should exercise for at least 150 minutes each week. The exercise should increase your heart rate and make you sweat (moderate-intensity exercise).  Most adults should also do strengthening exercises at least twice a week. This is in addition to the moderate-intensity exercise.  Maintain a healthy weight  Body mass index (BMI) is a measurement that can be used to identify possible weight problems. It estimates body fat based on height and weight. Your health care provider can help determine your BMI and help you achieve or maintain a healthy weight.  For females 27 years of age and older:   A BMI below 18.5 is considered underweight.  A BMI of 18.5 to 24.9 is normal.  A BMI of 25 to 29.9 is considered overweight.  A BMI of 30 and above is considered obese.  Watch levels of cholesterol and blood lipids  You should start having your blood tested for lipids and cholesterol at 70 years of age, then have this test every 5 years.  You may need to have your cholesterol levels checked more often if:  Your lipid or cholesterol levels are high.  You are older than 70 years of age.  You are at high risk for heart disease.  CANCER SCREENING   Lung Cancer  Lung cancer screening is recommended for adults 29-68 years old who are at high risk for lung cancer because of a history of smoking.  A yearly low-dose CT scan of the lungs is recommended for people who:  Currently smoke.  Have quit within the past 15 years.  Have at least a 30-pack-year history of smoking. A pack year is smoking an average of one pack of cigarettes a day for 1 year.  Yearly screening should continue until it has been 15 years since you quit.  Yearly screening should stop if you develop a health problem that would prevent you from having lung cancer treatment.  Breast Cancer  Practice breast self-awareness. This means understanding how your breasts normally appear and feel.  It also means doing  regular breast self-exams. Let your health care provider know about any changes, no matter how small.  If you are in your 20s or 30s, you should have a clinical breast exam (CBE) by a health care provider every 1-3 years as part of a regular health exam.  If you are 44 or older, have a CBE every year. Also consider having a breast X-ray (mammogram) every year.  If you have a family history of breast cancer, talk to your health care provider about genetic screening.  If you are at high risk for breast cancer, talk to your health care provider about having an MRI and a mammogram every year.  Breast cancer gene (BRCA) assessment is recommended for women who have family members with BRCA-related cancers. BRCA-related cancers include:  Breast.  Ovarian.  Tubal.  Peritoneal cancers.  Results of the assessment will determine the need for genetic counseling and BRCA1 and BRCA2 testing. Cervical Cancer Your health care provider may recommend that you be screened regularly for cancer of the pelvic organs (ovaries, uterus, and vagina). This screening involves a pelvic examination, including checking for microscopic changes to the surface of your cervix (Pap test). You may be encouraged to have this screening done every 3 years, beginning at age 56.  For women ages 30-65, health care providers may recommend pelvic exams and Pap testing every 3 years, or they may recommend the Pap and pelvic exam, combined with testing for human papilloma virus (HPV), every 5 years. Some types of HPV increase your risk of cervical cancer. Testing for HPV may also be done on women of any age with unclear Pap test results.  Other health care providers may not recommend any screening for nonpregnant women who are considered low risk for pelvic cancer and who do not have symptoms. Ask your health care provider if a screening pelvic exam is right for you.  If you have had past treatment for cervical cancer or a condition  that could lead to cancer, you need Pap tests and screening for cancer for at least 20 years after your treatment. If Pap tests have been discontinued, your risk factors (such as having a new sexual partner) need to be reassessed to determine if screening should resume. Some women have medical problems that increase the chance of getting cervical cancer. In these cases, your health care provider may recommend more frequent screening and Pap tests. Colorectal Cancer  This type of cancer can be detected and often prevented.  Routine colorectal cancer screening usually begins at 70 years of age and continues through 70 years of age.  Your health care provider may recommend screening at an earlier age if you have risk factors for colon cancer.  Your health care provider may also recommend using home test kits to check for hidden blood in the stool.  A small camera at the end of a tube can be used to examine your colon directly (sigmoidoscopy or colonoscopy). This is done to check for the earliest forms of colorectal cancer.  Routine screening usually begins at age 55.  Direct examination of the colon should be repeated every 5-10 years through 70 years of age. However, you may need to be screened more often if early forms of precancerous polyps or small growths are found. Skin Cancer  Check your skin from head to toe regularly.  Tell your health care provider about any new moles or changes in moles, especially if there is a change in a mole's shape or color.  Also tell your health care provider if you have a mole that is larger than the size of a pencil eraser.  Always use sunscreen. Apply sunscreen liberally and repeatedly throughout the day.  Protect yourself by wearing long sleeves, pants, a wide-brimmed hat, and sunglasses whenever you are outside. HEART DISEASE, DIABETES, AND HIGH BLOOD PRESSURE   High blood pressure causes heart disease and increases the risk of stroke. High blood  pressure is more likely to develop in:  People who have blood pressure in the high end of the normal range (130-139/85-89 mm Hg).  People who are overweight or obese.  People who are African American.  If you are 54-27 years of age, have your blood pressure checked every 3-5 years. If you are 78 years of age or older, have your blood pressure checked every year. You should have your blood pressure measured twice--once when you are at a hospital or clinic, and once when you are not at a hospital or clinic. Record the average of the two measurements. To check your blood pressure when you are not at a hospital or clinic, you can use:  An automated blood pressure machine at a pharmacy.  A home blood pressure monitor.  If you are between 45 years and 48 years old, ask your health care provider if you should take aspirin to prevent strokes.  Have regular diabetes screenings. This involves taking a blood sample to check your fasting blood sugar level.  If you are at a normal weight and have a low risk for diabetes, have this test once every three years after 70 years of age.  If you are overweight and have a high risk for diabetes, consider being tested at a younger age or more often. PREVENTING INFECTION  Hepatitis B  If you have a higher risk for hepatitis B, you should be screened for this virus. You are considered at high risk for hepatitis B if:  You were born in a country where hepatitis B is common. Ask your health care provider which countries are considered high risk.  Your parents were born in a high-risk country, and you have not been immunized against hepatitis B (hepatitis B vaccine).  You have HIV or AIDS.  You use needles to inject street drugs.  You live with someone who has hepatitis B.  You have had sex with someone who has hepatitis B.  You get hemodialysis treatment.  You take certain medicines for conditions, including cancer, organ transplantation, and  autoimmune conditions. Hepatitis C  Blood testing is recommended for:  Everyone born from [birth year] through 1966.  Anyone with known risk factors for hepatitis C. Sexually transmitted infections (STIs)  You should be screened for sexually transmitted infections (STIs) including gonorrhea and chlamydia if:  You are sexually active and are younger than 70 years of age.  You are older than 70 years of age and your health care provider tells you that you are at risk for this type of infection.  Your sexual activity has changed  since you were last screened and you are at an increased risk for chlamydia or gonorrhea. Ask your health care provider if you are at risk.  If you do not have HIV, but are at risk, it may be recommended that you take a prescription medicine daily to prevent HIV infection. This is called pre-exposure prophylaxis (PrEP). You are considered at risk if:  You are sexually active and do not regularly use condoms or know the HIV status of your partner(s).  You take drugs by injection.  You are sexually active with a partner who has HIV. Talk with your health care provider about whether you are at high risk of being infected with HIV. If you choose to begin PrEP, you should first be tested for HIV. You should then be tested every 3 months for as long as you are taking PrEP.  PREGNANCY   If you are premenopausal and you may become pregnant, ask your health care provider about preconception counseling.  If you may become pregnant, take 400 to 800 micrograms (mcg) of folic acid every day.  If you want to prevent pregnancy, talk to your health care provider about birth control (contraception). OSTEOPOROSIS AND MENOPAUSE   Osteoporosis is a disease in which the bones lose minerals and strength with aging. This can result in serious bone fractures. Your risk for osteoporosis can be identified using a bone density scan.  If you are 33 years of age or older, or if you are at risk  for osteoporosis and fractures, ask your health care provider if you should be screened.  Ask your health care provider whether you should take a calcium or vitamin D supplement to lower your risk for osteoporosis.  Menopause may have certain physical symptoms and risks.  Hormone replacement therapy may reduce some of these symptoms and risks. Talk to your health care provider about whether hormone replacement therapy is right for you.  HOME CARE INSTRUCTIONS   Schedule regular health, dental, and eye exams.  Stay current with your immunizations.   Do not use any tobacco products including cigarettes, chewing tobacco, or electronic cigarettes.  If you are pregnant, do not drink alcohol.  If you are breastfeeding, limit how much and how often you drink alcohol.  Limit alcohol intake to no more than 1 drink per day for nonpregnant women. One drink equals 12 ounces of beer, 5 ounces of wine, or 1 ounces of hard liquor.  Do not use street drugs.  Do not share needles.  Ask your health care provider for help if you need support or information about quitting drugs.  Tell your health care provider if you often feel depressed.  Tell your health care provider if you have ever been abused or do not feel safe at home.   This information is not intended to replace advice given to you by your health care provider. Make sure you discuss any questions you have with your health care provider.   Document Released: 10/19/2010 Document Revised: 04/26/2014 Document Reviewed: 03/07/2013 Elsevier Interactive Patient Education [employer].

## 2015-02-25 NOTE — Assessment & Plan Note (Signed)
previously rx'd atorvastatin, prava and simva - stopped due to myalgias and feared side effects  Reviewed last lipid profile - rx atorva 08/2012 but not started until 11/2012 and variable compliance Check annually - encouraged to take as tolerable

## 2015-03-19 ENCOUNTER — Ambulatory Visit: Payer: Medicare HMO

## 2015-07-17 DIAGNOSIS — M25561 Pain in right knee: Secondary | ICD-10-CM | POA: Diagnosis not present

## 2015-07-17 DIAGNOSIS — M25562 Pain in left knee: Secondary | ICD-10-CM | POA: Diagnosis not present

## 2015-07-17 DIAGNOSIS — M170 Bilateral primary osteoarthritis of knee: Secondary | ICD-10-CM | POA: Diagnosis not present

## 2015-07-24 DIAGNOSIS — M170 Bilateral primary osteoarthritis of knee: Secondary | ICD-10-CM | POA: Diagnosis not present

## 2015-07-24 DIAGNOSIS — M25562 Pain in left knee: Secondary | ICD-10-CM | POA: Diagnosis not present

## 2015-07-24 DIAGNOSIS — R262 Difficulty in walking, not elsewhere classified: Secondary | ICD-10-CM | POA: Diagnosis not present

## 2015-07-24 DIAGNOSIS — M25561 Pain in right knee: Secondary | ICD-10-CM | POA: Diagnosis not present

## 2015-07-31 DIAGNOSIS — M25561 Pain in right knee: Secondary | ICD-10-CM | POA: Diagnosis not present

## 2015-07-31 DIAGNOSIS — M25562 Pain in left knee: Secondary | ICD-10-CM | POA: Diagnosis not present

## 2015-07-31 DIAGNOSIS — M170 Bilateral primary osteoarthritis of knee: Secondary | ICD-10-CM | POA: Diagnosis not present

## 2015-08-26 ENCOUNTER — Ambulatory Visit: Payer: Medicare HMO | Admitting: Internal Medicine

## 2015-08-28 DIAGNOSIS — M170 Bilateral primary osteoarthritis of knee: Secondary | ICD-10-CM | POA: Diagnosis not present

## 2015-08-28 DIAGNOSIS — R2689 Other abnormalities of gait and mobility: Secondary | ICD-10-CM | POA: Diagnosis not present

## 2015-08-28 DIAGNOSIS — M25562 Pain in left knee: Secondary | ICD-10-CM | POA: Diagnosis not present

## 2015-08-28 DIAGNOSIS — M25561 Pain in right knee: Secondary | ICD-10-CM | POA: Diagnosis not present

## 2015-09-03 ENCOUNTER — Ambulatory Visit: Payer: Medicare HMO | Admitting: Internal Medicine

## 2015-10-17 ENCOUNTER — Other Ambulatory Visit: Payer: Self-pay | Admitting: Internal Medicine

## 2016-01-02 ENCOUNTER — Ambulatory Visit: Payer: Medicare HMO | Admitting: Internal Medicine

## 2016-02-17 ENCOUNTER — Emergency Department (HOSPITAL_COMMUNITY): Payer: Medicare HMO

## 2016-02-17 ENCOUNTER — Encounter (HOSPITAL_COMMUNITY): Payer: Self-pay | Admitting: Family Medicine

## 2016-02-17 ENCOUNTER — Emergency Department (HOSPITAL_COMMUNITY)
Admission: EM | Admit: 2016-02-17 | Discharge: 2016-02-17 | Disposition: A | Discharge status: Home / Self Care | Payer: Medicare HMO | Attending: Emergency Medicine | Admitting: Emergency Medicine

## 2016-02-17 DIAGNOSIS — I10 Essential (primary) hypertension: Secondary | ICD-10-CM | POA: Diagnosis not present

## 2016-02-17 DIAGNOSIS — R42 Dizziness and giddiness: Secondary | ICD-10-CM

## 2016-02-17 DIAGNOSIS — Z87891 Personal history of nicotine dependence: Secondary | ICD-10-CM | POA: Insufficient documentation

## 2016-02-17 DIAGNOSIS — K219 Gastro-esophageal reflux disease without esophagitis: Secondary | ICD-10-CM | POA: Insufficient documentation

## 2016-02-17 DIAGNOSIS — Z79899 Other long term (current) drug therapy: Secondary | ICD-10-CM | POA: Diagnosis not present

## 2016-02-17 DIAGNOSIS — R079 Chest pain, unspecified: Secondary | ICD-10-CM | POA: Diagnosis not present

## 2016-02-17 HISTORY — DX: Anxiety disorder, unspecified: F41.9

## 2016-02-17 LAB — CBC WITH DIFFERENTIAL/PLATELET
BASOS PCT: 0 %
Basophils Absolute: 0 10*3/uL (ref 0.0–0.1)
EOS ABS: 0.1 10*3/uL (ref 0.0–0.7)
Eosinophils Relative: 1 %
HCT: 40.1 % (ref 36.0–46.0)
HEMOGLOBIN: 13.8 g/dL (ref 12.0–15.0)
LYMPHS ABS: 1.2 10*3/uL (ref 0.7–4.0)
Lymphocytes Relative: 13 %
MCH: 28.5 pg (ref 26.0–34.0)
MCHC: 34.4 g/dL (ref 30.0–36.0)
MCV: 82.7 fL (ref 78.0–100.0)
MONO ABS: 0.6 10*3/uL (ref 0.1–1.0)
Monocytes Relative: 7 %
Neutro Abs: 6.9 10*3/uL (ref 1.7–7.7)
Neutrophils Relative %: 79 %
Platelets: 195 10*3/uL (ref 150–400)
RBC: 4.85 MIL/uL (ref 3.87–5.11)
RDW: 13.9 % (ref 11.5–15.5)
WBC: 8.8 10*3/uL (ref 4.0–10.5)

## 2016-02-17 LAB — I-STAT TROPONIN, ED: TROPONIN I, POC: 0 ng/mL (ref 0.00–0.08)

## 2016-02-17 LAB — BASIC METABOLIC PANEL
Anion gap: 11 (ref 5–15)
BUN: 15 mg/dL (ref 6–20)
CHLORIDE: 105 mmol/L (ref 101–111)
CO2: 23 mmol/L (ref 22–32)
Calcium: 9.6 mg/dL (ref 8.9–10.3)
Creatinine, Ser: 0.6 mg/dL (ref 0.44–1.00)
GFR calc non Af Amer: >60 mL/min (ref >60)
Glucose, Bld: 116 mg/dL — ABNORMAL HIGH (ref 65–99)
Potassium: 3.3 mmol/L — ABNORMAL LOW (ref 3.5–5.1)
SODIUM: 139 mmol/L (ref 135–145)

## 2016-02-17 NOTE — ED Triage Notes (Signed)
Patient reports she is experiencing dizziness that started about 1-2 hours ago. Denies any headache but reports nausea.

## 2016-02-17 NOTE — ED Provider Notes (Signed)
[facility] DEPT Provider Note   CSN: [id number] Arrival date & time: 02/17/16  0132 By signing my name below, I, Donna Lawson, attest that this documentation has been prepared under the direction and in the presence of [name], MDElectronically Signed: Dyke Lawson, Scribe. 02/17/2016. 2:21 AM.   History   Chief Complaint Chief Complaint  Patient presents with  . Dizziness   HPI Donna Lawson is a 70 y.o. female who presents to the Emergency Department complaining of lightheadedness which began one hour ago. She also complains of associated nausea, bloating, and gas. Pt states she had some bloating and gas after dinner tonight and went to lie down when dizziness began. No alleviating or modifying factors noted. Per pt, she has had this a few times before, but it resolves itself.  She states she did not have much to eat tonight. Pt denies any chest pain or pressure.   The history is provided by the patient. No language interpreter was used.   Past Medical History:  Diagnosis Date  . Anxiety   . Arthritis   . Diverticula, colon 1999  . Heart murmur   . History of blood transfusion   . Hyperlipidemia   . Hypertension   . Osteoarthritis of knee    right    Patient Active Problem List   Diagnosis Date Noted  . Anxiety 03/29/2013  . Hypertension   . Hyperlipidemia   . Osteoarthritis of knee   . Obese     Past Surgical History:  Procedure Laterality Date  . ABDOMINAL SURGERY    . COLONOSCOPY    . NO PAST SURGERIES      OB History    No data available     Home Medications    Prior to Admission medications   Medication Sig Start Date End Date Taking? Authorizing Provider  ALPRAZolam Duanne Moron) 0.5 MG tablet Take 1 tablet (0.5 mg total) by mouth 3 (three) times daily as needed for sleep or anxiety. 02/25/15  Yes Rowe Clack, MD  amLODipine (NORVASC) 10 MG tablet Take 1 tablet (10 mg total) by mouth daily. 02/25/15  Yes Rowe Clack, MD    atorvastatin (LIPITOR) 20 MG tablet Take 1 tablet (20 mg total) by mouth daily. Patient taking differently: Take 20 mg by mouth 2 (two) times a week.  02/25/15  Yes Rowe Clack, MD  hydrochlorothiazide (HYDRODIURIL) 25 MG tablet Take 1 tablet (25 mg total) by mouth daily. 02/25/15  Yes Rowe Clack, MD    Family History Family History  Problem Relation Age of Onset  . Arthritis Mother   . Arthritis Father   . Hypertension Other     Social History Social History  Substance Use Topics  . Smoking status: Former Research scientist (life sciences)  . Smokeless tobacco: Never Used  . Alcohol use No    Allergies   Lisinopril  Review of Systems Review of Systems 10 systems reviewed and all are negative for acute change except as noted in the HPI.  Physical Exam Updated Vital Signs BP (!) 160/101 (BP Location: Right Arm)   Pulse 83   Temp 98.3 F (36.8 C) (Oral)   Resp 14   Ht 5\' 3"  (1.6 m)   Wt 180 lb (81.6 kg)   SpO2 94%   BMI 31.89 kg/m   Physical Exam  Constitutional: She is oriented to person, place, and time. She appears well-developed and well-nourished. No distress.  HENT:  Head: Normocephalic and atraumatic.  Right Ear: Hearing normal.  Left Ear: Hearing normal.  Nose: Nose normal.  Mouth/Throat: Oropharynx is clear and moist and mucous membranes are normal.  Eyes: Conjunctivae and EOM are normal. Pupils are equal, round, and reactive to light.  Neck: Normal range of motion. Neck supple.  Cardiovascular: Regular rhythm, S1 normal and S2 normal.  Exam reveals no gallop and no friction rub.   No murmur heard. Pulmonary/Chest: Effort normal and breath sounds normal. No respiratory distress. She exhibits no tenderness.  Abdominal: Soft. Normal appearance and bowel sounds are normal. There is no hepatosplenomegaly. There is no tenderness. There is no rebound, no guarding, no tenderness at McBurney's point and negative Murphy's sign. No hernia.  Musculoskeletal: Normal range of  motion.  Neurological: She is alert and oriented to person, place, and time. She has normal strength. No cranial nerve deficit or sensory deficit. Coordination normal. GCS eye subscore is 4. GCS verbal subscore is 5. GCS motor subscore is 6.  Skin: Skin is warm, dry and intact. No rash noted. No cyanosis.  Psychiatric: She has a normal mood and affect. Her speech is normal and behavior is normal. Thought content normal.  Nursing note and vitals reviewed.   ED Treatments / Results  DIAGNOSTIC STUDIES:  Oxygen Saturation is 99% on RA, normal by my interpretation.    COORDINATION OF CARE:  2:21 AM Discussed treatment plan with pt at bedside and pt agreed to plan.  Labs (all labs ordered are listed, but only abnormal results are displayed) Labs Reviewed  BASIC METABOLIC PANEL - Abnormal; Notable for the following:       Result Value   Potassium 3.3 (*)    Glucose, Bld 116 (*)    All other components within normal limits  CBC WITH DIFFERENTIAL/PLATELET  Randolm Idol, ED    EKG  EKG Interpretation None       Radiology Dg Chest 2 View  Result Date: 02/17/2016 CLINICAL DATA:  Sudden onset of chest pain this morning EXAM: CHEST  2 VIEW COMPARISON:  None. FINDINGS: The heart size and mediastinal contours are within normal limits. Both lungs are clear. The visualized skeletal structures are unremarkable. IMPRESSION: No active cardiopulmonary disease. Electronically Signed   By: Andreas Newport M.D.   On: 02/17/2016 03:32    Procedures Procedures (including critical care time)  Medications Ordered in ED Medications - No data to display   Initial Impression / Assessment and Plan / ED Course  I have reviewed the triage vital signs and the nursing notes.  Pertinent labs & imaging results that were available during my care of the patient were reviewed by me and considered in my medical decision making (see chart for details).  Clinical Course   Patient presents with  concerns over chest discomfort. Patient thought she was experiencing indigestion earlier today. She felt a burning sensation over the center of her chest. It was accompanied by belching. She has had this before, but her grandson thought she should get her heart checked out. At arrival she is without complaints. Vital signs are normal. EKG did not show any signs of ischemia or infarct. Troponin was negative. Patient continues to be symptom free here in the ER other than slight dizziness which she has had intermittently in the past. She has normal neurologic function. She does not require any further workup. Patient reassured, and follow-up with primary care as an outpatient.  Final Clinical Impressions(s) / ED Diagnoses   Final diagnoses:  Dizziness  Gastroesophageal reflux disease, esophagitis presence not specified  New Prescriptions New Prescriptions   No medications on file  I personally performed the services described in this documentation, which was scribed in my presence. The recorded information has been reviewed and is accurate.    [name], MD 02/17/16 [phone]

## 2016-06-29 ENCOUNTER — Ambulatory Visit: Payer: Medicare HMO | Admitting: Nurse Practitioner

## 2016-06-29 ENCOUNTER — Telehealth: Payer: Self-pay | Admitting: Nurse Practitioner

## 2016-06-29 NOTE — Telephone Encounter (Signed)
Patient had appt set up for this morning to establish care.  Due to the weather we had to cancel patients appt due to the office opening late.  Patient last seen Dr. Asa Lente in 2016.  Patient is requesting to be worked in sooner due to medication running out.

## 2016-06-29 NOTE — Telephone Encounter (Signed)
Got patient scheduled

## 2016-06-29 NOTE — Telephone Encounter (Signed)
Ok to schedule patiet for tomorrow morning at 8am. Block 8:15am slot as well. Thank you

## 2016-07-01 ENCOUNTER — Other Ambulatory Visit (INDEPENDENT_AMBULATORY_CARE_PROVIDER_SITE_OTHER): Payer: Medicare HMO

## 2016-07-01 ENCOUNTER — Encounter: Payer: Self-pay | Admitting: Nurse Practitioner

## 2016-07-01 ENCOUNTER — Ambulatory Visit (INDEPENDENT_AMBULATORY_CARE_PROVIDER_SITE_OTHER): Payer: Medicare HMO | Admitting: Nurse Practitioner

## 2016-07-01 VITALS — BP 136/94 | HR 103 | Temp 98.7°F | Ht 62.0 in | Wt 193.0 lb

## 2016-07-01 DIAGNOSIS — I10 Essential (primary) hypertension: Secondary | ICD-10-CM

## 2016-07-01 DIAGNOSIS — F419 Anxiety disorder, unspecified: Secondary | ICD-10-CM | POA: Diagnosis not present

## 2016-07-01 DIAGNOSIS — E782 Mixed hyperlipidemia: Secondary | ICD-10-CM

## 2016-07-01 DIAGNOSIS — R69 Illness, unspecified: Secondary | ICD-10-CM | POA: Diagnosis not present

## 2016-07-01 LAB — COMPREHENSIVE METABOLIC PANEL
ALBUMIN: 4.7 g/dL (ref 3.5–5.2)
ALK PHOS: 93 U/L (ref 39–117)
ALT: 17 U/L (ref 0–35)
AST: 16 U/L (ref 0–37)
BUN: 19 mg/dL (ref 6–23)
CALCIUM: 10.2 mg/dL (ref 8.4–10.5)
CO2: 29 mEq/L (ref 19–32)
Chloride: 99 mEq/L (ref 96–112)
Creatinine, Ser: 0.78 mg/dL (ref 0.40–1.20)
GFR: 77.12 mL/min (ref >60.00)
Glucose, Bld: 124 mg/dL — ABNORMAL HIGH (ref 70–99)
POTASSIUM: 3.4 meq/L — AB (ref 3.5–5.1)
SODIUM: 138 meq/L (ref 135–145)
TOTAL PROTEIN: 7.7 g/dL (ref 6.0–8.3)
Total Bilirubin: 0.7 mg/dL (ref 0.2–1.2)

## 2016-07-01 LAB — LIPID PANEL
CHOLESTEROL: 292 mg/dL — AB (ref 0–200)
HDL: 64.4 mg/dL (ref >39.00)
NonHDL: 227.82
Total CHOL/HDL Ratio: 5
Triglycerides: 214 mg/dL — ABNORMAL HIGH (ref 0.0–149.0)
VLDL: 42.8 mg/dL — AB (ref 0.0–40.0)

## 2016-07-01 LAB — LDL CHOLESTEROL, DIRECT: Direct LDL: 166 mg/dL

## 2016-07-01 MED ORDER — HYDROCHLOROTHIAZIDE 25 MG PO TABS: 25.0000 mg | ORAL_TABLET | Freq: Every day | ORAL | 1 refills | Status: DC

## 2016-07-01 MED ORDER — AMLODIPINE BESYLATE 10 MG PO TABS: 10.0000 mg | ORAL_TABLET | Freq: Every day | ORAL | 1 refills | Status: DC

## 2016-07-01 MED ORDER — ALPRAZOLAM 0.5 MG PO TABS: 0.5000 mg | ORAL_TABLET | Freq: Every evening | ORAL | 1 refills | Status: DC | PRN

## 2016-07-01 NOTE — Progress Notes (Signed)
Subjective:    Patient ID: Donna Lawson, female    DOB: May 16, 1945, 71 y.o.   MRN: [id number]  Patient presents today to establish care  HPI   Chronic knee pain: Referred to [facility] (new alternative medicine) 2years ago; surgery and knee injections recommended. Unable to follow recommended therapy due to cost. Current use of NSAIDs with minimal relief  Insomnia and anxiety:  Has been out of xanax for over 1year. Has tried OTC sleep aid with no help. No other sleep aid used in past.  HTN: stable, no BP check at home.  Hyperlipidemia: uses lipitor 2-3times a week to minimize myalgia.  Immunizations: (TDAP, Hep C screen, Pneumovax, Influenza, zoster)  Health Maintenance  Topic Date Due  . Flu Shot  11/18/2015  . Mammogram  01/04/2016  . DEXA scan (bone density measurement)  06/17/2017*  .  Hepatitis C: One time screening is recommended by [place] [employer]  (CDC) for  adults born from [birth year] through 1966.   06/17/2017*  . Pneumonia vaccines (1 of 2 - PCV13) 06/17/2017*  . Colon Cancer Screening  03/19/2021*  . Tetanus Vaccine  04/20/2019  *Topic was postponed. The date shown is not the original due date.   Diet:regular Weight:  Wt Readings from Last 3 Encounters:  07/01/16 193 lb (87.5 kg)  02/17/16 180 lb (81.6 kg)  02/25/15 191 lb 4 oz (86.8 kg)   Exercise: some walking, unable to do more due to severe knee pain secondary to OA Fall Risk: Fall Risk  07/01/2016 02/25/2015  Falls in the past year? No No   Home Safety:home alone Depression/Suicide: Depression screen Donna Lawson 2/9 07/01/2016 02/25/2015  Decreased Interest 0 0  Down, Depressed, Hopeless 0 0  PHQ - 2 Score 0 0   No flowsheet data found. Colonoscopy (every 5-6yrs, >50-59yrs):up to date Dexa (every 2-8yrs, >46yrs):declined Mammogram (yearly, >71yrs):needed, she will schedule Vision:needed Dental:up to date Advanced Directive: Advanced Directives 02/17/2016  Does Patient Have a Medical  Advance Directive? No  Would patient like information on creating a medical advance directive? No - patient declined information    Medications and allergies reviewed with patient and updated if appropriate.  Patient Active Problem List   Diagnosis Date Noted  . Anxiety 03/29/2013  . Hypertension   . Hyperlipidemia   . Osteoarthritis of knee   . Obese     Current Outpatient Prescriptions on File Prior to Visit  Medication Sig Dispense Refill  . atorvastatin (LIPITOR) 20 MG tablet Take 1 tablet (20 mg total) by mouth daily. 90 tablet 3  . [DISCONTINUED] lisinopril-hydrochlorothiazide (PRINZIDE,ZESTORETIC) 20-25 MG per tablet Take 1 tablet by mouth 2 (two) times daily. Take 1 by mouth daily     No current facility-administered medications on file prior to visit.     Past Medical History:  Diagnosis Date  . Anxiety   . Arthritis   . Diverticula, colon 1999  . Heart murmur   . History of blood transfusion   . Hyperlipidemia   . Hypertension   . Osteoarthritis of knee    right    Past Surgical History:  Procedure Laterality Date  . ABDOMINAL SURGERY    . COLONOSCOPY    . NO PAST SURGERIES      Social History   Social History  . Marital status: Widowed    Spouse name: N/A  . Number of children: N/A  . Years of education: N/A   Social History Main Topics  . Smoking status: Former Research scientist (life sciences)  .  Smokeless tobacco: Never Used  . Alcohol use No  . Drug use: No  . Sexual activity: Not Asked   Other Topics Concern  . None   Social History Narrative   Widowed, lives alone with 2 dogs       Family History  Problem Relation Age of Onset  . Arthritis Mother   . Arthritis Father   . Hypertension Other         Review of Systems  Constitutional: Negative for fever, malaise/fatigue and weight loss.  HENT: Negative for congestion and sore throat.   Eyes:       Negative for visual changes  Respiratory: Negative for cough and shortness of breath.   Cardiovascular:  Negative for chest pain, palpitations and leg swelling.  Gastrointestinal: Negative for blood in stool, constipation, diarrhea and heartburn.  Genitourinary: Negative for dysuria, frequency and urgency.  Musculoskeletal: Positive for joint pain. Negative for falls and myalgias.  Skin: Negative for rash.  Neurological: Negative for dizziness, tingling, sensory change and headaches.  Endo/Heme/Allergies: Does not bruise/bleed easily.  Psychiatric/Behavioral: Negative for depression, substance abuse and suicidal ideas. The patient is nervous/anxious and has insomnia.     Objective:   Vitals:   07/01/16 0824  BP: (!) 136/94  Pulse: (!) 103  Temp: 98.7 F (37.1 C)    Body mass index is 35.3 kg/m.   Physical Examination:  Physical Exam  Constitutional: She is oriented to person, place, and time. No distress.  HENT:  Right Ear: External ear normal.  Left Ear: External ear normal.  Nose: Nose normal.  Mouth/Throat: Oropharynx is clear and moist. No oropharyngeal exudate.  Neck: Normal range of motion. Neck supple. No thyromegaly present.  Cardiovascular: Normal rate, regular rhythm and normal heart sounds.   Pulmonary/Chest: Effort normal and breath sounds normal.  Abdominal: Soft. Bowel sounds are normal. She exhibits no distension. There is no tenderness.  Musculoskeletal: She exhibits tenderness. She exhibits no edema.  Lymphadenopathy:    She has no cervical adenopathy.  Neurological: She is alert and oriented to person, place, and time.  Skin: Skin is warm and dry.  Vitals reviewed.   ASSESSMENT and PLAN:  Donna Lawson was seen today for establish care.  Diagnoses and all orders for this visit:  Essential hypertension -     Comprehensive metabolic panel; Future -     hydrochlorothiazide (HYDRODIURIL) 25 MG tablet; Take 1 tablet (25 mg total) by mouth daily. -     amLODipine (NORVASC) 10 MG tablet; Take 1 tablet (10 mg total) by mouth daily. -     potassium chloride SA  (K-DUR,KLOR-CON) 20 MEQ tablet; Take 1 tablet (20 mEq total) by mouth daily.  Mixed hyperlipidemia -     Comprehensive metabolic panel; Future -     Lipid panel; Future  Anxiety -     ALPRAZolam (XANAX) 0.5 MG tablet; Take 1 tablet (0.5 mg total) by mouth at bedtime as needed for anxiety or sleep.   No problem-specific Assessment & Plan notes found for this encounter.    CMP     Component Value Date/Time   NA 138 07/01/2016 0905   K 3.4 (L) 07/01/2016 0905   CL 99 07/01/2016 0905   CO2 29 07/01/2016 0905   GLUCOSE 124 (H) 07/01/2016 0905   BUN 19 07/01/2016 0905   CREATININE 0.78 07/01/2016 0905   CALCIUM 10.2 07/01/2016 0905   PROT 7.7 07/01/2016 0905   ALBUMIN 4.7 07/01/2016 0905   AST 16 07/01/2016 0905  ALT 17 07/01/2016 0905   ALKPHOS 93 07/01/2016 0905   BILITOT 0.7 07/01/2016 0905   GFRNONAA >60 02/17/2016 0315   GFRAA >60 02/17/2016 0315    Lipid Panel     Component Value Date/Time   CHOL 292 (H) 07/01/2016 0905   TRIG 214.0 (H) 07/01/2016 0905   HDL 64.40 07/01/2016 0905   CHOLHDL 5 07/01/2016 0905   VLDL 42.8 (H) 07/01/2016 0905   LDLCALC 139 (H) 02/25/2015 1008   LDLDIRECT 166.0 07/01/2016 0905   Follow up: Return in about 6 months (around 01/01/2017) for hyperlipidemia and HTN.Wilfred Lacy, NP

## 2016-07-01 NOTE — Patient Instructions (Addendum)
Please schedule mammogram and have results faxed to office.  Maintain non weight bearing exercise.  DASH Eating Plan DASH stands for "Dietary Approaches to Stop Hypertension." The DASH eating plan is a healthy eating plan that has been shown to reduce high blood pressure (hypertension). It may also reduce your risk for type 2 diabetes, heart disease, and stroke. The DASH eating plan may also help with weight loss. What are tips for following this plan? General guidelines   Avoid eating more than 2,300 mg (milligrams) of salt (sodium) a day. If you have hypertension, you may need to reduce your sodium intake to 1,500 mg a day.  Limit alcohol intake to no more than 1 drink a day for nonpregnant women and 2 drinks a day for men. One drink equals 12 oz of beer, 5 oz of wine, or 1 oz of hard liquor.  Work with your health care provider to maintain a healthy body weight or to lose weight. Ask what an ideal weight is for you.  Get at least 30 minutes of exercise that causes your heart to beat faster (aerobic exercise) most days of the week. Activities may include walking, swimming, or biking.  Work with your health care provider or diet and nutrition specialist (dietitian) to adjust your eating plan to your individual calorie needs. Reading food labels   Check food labels for the amount of sodium per serving. Choose foods with less than 5 percent of the Daily Value of sodium. Generally, foods with less than 300 mg of sodium per serving fit into this eating plan.  To find whole grains, look for the word "whole" as the first word in the ingredient list. Shopping   Buy products labeled as "low-sodium" or "no salt added."  Buy fresh foods. Avoid canned foods and premade or frozen meals. Cooking   Avoid adding salt when cooking. Use salt-free seasonings or herbs instead of table salt or sea salt. Check with your health care provider or pharmacist before using salt substitutes.  Do not fry foods.  Cook foods using healthy methods such as baking, boiling, grilling, and broiling instead.  Cook with heart-healthy oils, such as olive, canola, soybean, or sunflower oil. Meal planning    Eat a balanced diet that includes:  5 or more servings of fruits and vegetables each day. At each meal, try to fill half of your plate with fruits and vegetables.  Up to 6-8 servings of whole grains each day.  Less than 6 oz of lean meat, poultry, or fish each day. A 3-oz serving of meat is about the same size as a deck of cards. One egg equals 1 oz.  2 servings of low-fat dairy each day.  A serving of nuts, seeds, or beans 5 times each week.  Heart-healthy fats. Healthy fats called Omega-3 fatty acids are found in foods such as flaxseeds and coldwater fish, like sardines, salmon, and mackerel.  Limit how much you eat of the following:  Canned or prepackaged foods.  Food that is high in trans fat, such as fried foods.  Food that is high in saturated fat, such as fatty meat.  Sweets, desserts, sugary drinks, and other foods with added sugar.  Full-fat dairy products.  Do not salt foods before eating.  Try to eat at least 2 vegetarian meals each week.  Eat more home-cooked food and less restaurant, buffet, and fast food.  When eating at a restaurant, ask that your food be prepared with less salt or no salt,  if possible. What foods are recommended? The items listed may not be a complete list. Talk with your dietitian about what dietary choices are best for you. Grains  Whole-grain or whole-wheat bread. Whole-grain or whole-wheat pasta. Brown rice. [place] Morrow. Bulgur. Whole-grain and low-sodium cereals. Pita bread. Low-fat, low-sodium crackers. Whole-wheat flour tortillas. Vegetables  Fresh or frozen vegetables (raw, steamed, roasted, or grilled). Low-sodium or reduced-sodium tomato and vegetable juice. Low-sodium or reduced-sodium tomato sauce and tomato paste. Low-sodium or  reduced-sodium canned vegetables. Fruits  All fresh, dried, or frozen fruit. Canned fruit in natural juice (without added sugar). Meat and other protein foods  Skinless chicken or [place]. Ground chicken or [place]. Pork with fat trimmed off. Fish and seafood. Egg whites. Dried beans, peas, or lentils. Unsalted nuts, nut butters, and seeds. Unsalted canned beans. Lean cuts of beef with fat trimmed off. Low-sodium, lean deli meat. Dairy  Low-fat (1%) or fat-free (skim) milk. Fat-free, low-fat, or reduced-fat cheeses. Nonfat, low-sodium ricotta or cottage cheese. Low-fat or nonfat yogurt. Low-fat, low-sodium cheese. Fats and oils  Soft margarine without trans fats. Vegetable oil. Low-fat, reduced-fat, or light mayonnaise and salad dressings (reduced-sodium). Canola, safflower, olive, soybean, and sunflower oils. Avocado. Seasoning and other foods  Herbs. Spices. Seasoning mixes without salt. Unsalted popcorn and pretzels. Fat-free sweets. What foods are not recommended? The items listed may not be a complete list. Talk with your dietitian about what dietary choices are best for you. Grains  Baked goods made with fat, such as croissants, muffins, or some breads. Dry pasta or rice meal packs. Vegetables  Creamed or fried vegetables. Vegetables in a cheese sauce. Regular canned vegetables (not low-sodium or reduced-sodium). Regular canned tomato sauce and paste (not low-sodium or reduced-sodium). Regular tomato and vegetable juice (not low-sodium or reduced-sodium). Angie Fava. Olives. Fruits  Canned fruit in a light or heavy syrup. Fried fruit. Fruit in cream or butter sauce. Meat and other protein foods  Fatty cuts of meat. Ribs. Fried meat. Berniece Salines. Sausage. Bologna and other processed lunch meats. Salami. Fatback. Hotdogs. Bratwurst. Salted nuts and seeds. Canned beans with added salt. Canned or smoked fish. Whole eggs or egg yolks. Chicken or [place] with skin. Dairy  Whole or 2% milk, cream, and  half-and-half. Whole or full-fat cream cheese. Whole-fat or sweetened yogurt. Full-fat cheese. Nondairy creamers. Whipped toppings. Processed cheese and cheese spreads. Fats and oils  Butter. Stick margarine. Lard. Shortening. Ghee. Bacon fat. Tropical oils, such as coconut, palm kernel, or palm oil. Seasoning and other foods  Salted popcorn and pretzels. Onion salt, garlic salt, seasoned salt, table salt, and sea salt. Worcestershire sauce. Tartar sauce. Barbecue sauce. Teriyaki sauce. Soy sauce, including reduced-sodium. Steak sauce. Canned and packaged gravies. Fish sauce. Oyster sauce. Cocktail sauce. Horseradish that you find on the shelf. Ketchup. Mustard. Meat flavorings and tenderizers. Bouillon cubes. Hot sauce and Tabasco sauce. Premade or packaged marinades. Premade or packaged taco seasonings. Relishes. Regular salad dressings. Where to find more information:  National Heart, Lung, and Regino Ramirez: https://wilson-eaton.com/  American Heart Association: www.heart.org Summary  The DASH eating plan is a healthy eating plan that has been shown to reduce high blood pressure (hypertension). It may also reduce your risk for type 2 diabetes, heart disease, and stroke.  With the DASH eating plan, you should limit salt (sodium) intake to 2,300 mg a day. If you have hypertension, you may need to reduce your sodium intake to 1,500 mg a day.  When on the DASH eating plan, aim to eat  more fresh fruits and vegetables, whole grains, lean proteins, low-fat dairy, and heart-healthy fats.  Work with your health care provider or diet and nutrition specialist (dietitian) to adjust your eating plan to your individual calorie needs. This information is not intended to replace advice given to you by your health care provider. Make sure you discuss any questions you have with your health care provider. Document Released: 03/25/2011 Document Revised: 03/29/2016 Document Reviewed: 03/29/2016 Elsevier Interactive  Patient Education  2017 [employer].

## 2016-07-01 NOTE — Progress Notes (Signed)
Pre visit review using our clinic review tool, if applicable. No additional management support is needed unless otherwise documented below in the visit note. 

## 2016-07-02 MED ORDER — POTASSIUM CHLORIDE CRYS ER 20 MEQ PO TBCR: 20.0000 meq | EXTENDED_RELEASE_TABLET | Freq: Every day | ORAL | 1 refills | Status: DC

## 2016-07-22 ENCOUNTER — Encounter: Payer: Self-pay | Admitting: Nurse Practitioner

## 2016-07-22 ENCOUNTER — Ambulatory Visit (INDEPENDENT_AMBULATORY_CARE_PROVIDER_SITE_OTHER): Payer: Medicare HMO | Admitting: Nurse Practitioner

## 2016-07-22 VITALS — BP 130/92 | HR 113 | Temp 98.5°F | Ht 62.0 in | Wt 190.0 lb

## 2016-07-22 DIAGNOSIS — R05 Cough: Secondary | ICD-10-CM | POA: Diagnosis not present

## 2016-07-22 DIAGNOSIS — R6889 Other general symptoms and signs: Secondary | ICD-10-CM

## 2016-07-22 DIAGNOSIS — R509 Fever, unspecified: Secondary | ICD-10-CM | POA: Diagnosis not present

## 2016-07-22 LAB — POCT INFLUENZA A/B
INFLUENZA A, POC: NEGATIVE
Influenza B, POC: NEGATIVE

## 2016-07-22 MED ORDER — PROMETHAZINE-DM 6.25-15 MG/5ML PO SYRP: 5.0000 mL | ORAL_SOLUTION | Freq: Three times a day (TID) | ORAL | 0 refills | Status: DC | PRN

## 2016-07-22 MED ORDER — OSELTAMIVIR PHOSPHATE 75 MG PO CAPS: 75.0000 mg | ORAL_CAPSULE | Freq: Two times a day (BID) | ORAL | 0 refills | Status: DC

## 2016-07-22 MED ORDER — IPRATROPIUM BROMIDE 0.03 % NA SOLN: 2.0000 | Freq: Two times a day (BID) | NASAL | 0 refills | Status: DC

## 2016-07-22 NOTE — Patient Instructions (Addendum)
Encourage adequate oral hydration.  Patient declined CXR.  Influenza, Adult Influenza, more commonly known as "the flu," is a viral infection that primarily affects the respiratory tract. The respiratory tract includes organs that help you breathe, such as the lungs, nose, and throat. The flu causes many common cold symptoms, as well as a high fever and body aches. The flu spreads easily from person to person (is contagious). Getting a flu shot (influenza vaccination) every year is the best way to prevent influenza. What are the causes? Influenza is caused by a virus. You can catch the virus by:  Breathing in droplets from an infected person's cough or sneeze.  Touching something that was recently contaminated with the virus and then touching your mouth, nose, or eyes. What increases the risk? The following factors may make you more likely to get the flu:  Not cleaning your hands frequently with soap and water or alcohol-based hand sanitizer.  Having close contact with many people during cold and flu season.  Touching your mouth, eyes, or nose without washing or sanitizing your hands first.  Not drinking enough fluids or not eating a healthy diet.  Not getting enough sleep or exercise.  Being under a high amount of stress.  Not getting a yearly (annual) flu shot. You may be at a higher risk of complications from the flu, such as a severe lung infection (pneumonia), if you:  Are over the age of 72.  Are pregnant.  Have a weakened disease-fighting system (immune system). You may have a weakened immune system if you:  Have HIV or AIDS.  Are undergoing chemotherapy.  Aretaking medicines that reduce the activity of (suppress) the immune system.  Have a long-term (chronic) illness, such as heart disease, kidney disease, diabetes, or lung disease.  Have a liver disorder.  Are obese.  Have anemia. What are the signs or symptoms? Symptoms of this condition typically last  4-10 days and may include:  Fever.  Chills.  Headache, body aches, or muscle aches.  Sore throat.  Cough.  Runny or congested nose.  Chest discomfort and cough.  Poor appetite.  Weakness or tiredness (fatigue).  Dizziness.  Nausea or vomiting. How is this diagnosed? This condition may be diagnosed based on your medical history and a physical exam. Your health care provider may do a nose or throat swab test to confirm the diagnosis. How is this treated? If influenza is detected early, you can be treated with antiviral medicine that can reduce the length of your illness and the severity of your symptoms. This medicine may be given by mouth (orally) or through an IV tube that is inserted in one of your veins. The goal of treatment is to relieve symptoms by taking care of yourself at home. This may include taking over-the-counter medicines, drinking plenty of fluids, and adding humidity to the air in your home. In some cases, influenza goes away on its own. Severe influenza or complications from influenza may be treated in a hospital. Follow these instructions at home:  Take over-the-counter and prescription medicines only as told by your health care provider.  Use a cool mist humidifier to add humidity to the air in your home. This can make breathing easier.  Rest as needed.  Drink enough fluid to keep your urine clear or pale yellow.  Cover your mouth and nose when you cough or sneeze.  Wash your hands with soap and water often, especially after you cough or sneeze. If soap and water are  not available, use hand sanitizer.  Stay home from work or school as told by your health care provider. Unless you are visiting your health care provider, try to avoid leaving home until your fever has been gone for 24 hours without the use of medicine.  Keep all follow-up visits as told by your health care provider. This is important. How is this prevented?  Getting an annual flu shot  is the best way to avoid getting the flu. You may get the flu shot in late summer, fall, or winter. Ask your health care provider when you should get your flu shot.  Wash your hands often or use hand sanitizer often.  Avoid contact with people who are sick during cold and flu season.  Eat a healthy diet, drink plenty of fluids, get enough sleep, and exercise regularly. Contact a health care provider if:  You develop new symptoms.  You have:  Chest pain.  Diarrhea.  A fever.  Your cough gets worse.  You produce more mucus.  You feel nauseous or you vomit. Get help right away if:  You develop shortness of breath or difficulty breathing.  Your skin or nails turn a bluish color.  You have severe pain or stiffness in your neck.  You develop a sudden headache or sudden pain in your face or ear.  You cannot stop vomiting. This information is not intended to replace advice given to you by your health care provider. Make sure you discuss any questions you have with your health care provider. Document Released: 04/02/2000 Document Revised: 09/11/2015 Document Reviewed: 01/28/2015 Elsevier Interactive Patient Education  2017 [employer].

## 2016-07-22 NOTE — Progress Notes (Signed)
Pre visit review using our clinic review tool, if applicable. No additional management support is needed unless otherwise documented below in the visit note. 

## 2016-07-22 NOTE — Progress Notes (Signed)
Subjective:  Patient ID: Donna Lawson, female    DOB: [birth year]  Age: 71 y.o. MRN: [id number]  CC: Cough (coughing,fever going on for 3 days. )   URI   This is a new problem. The current episode started yesterday. The problem has been rapidly worsening. The maximum temperature recorded prior to her arrival was 102 - 102.9 F. Associated symptoms include congestion, coughing, headaches, joint pain, rhinorrhea, sinus pain, sneezing, a sore throat and swollen glands. Pertinent negatives include no abdominal pain, chest pain, diarrhea, dysuria, ear pain, joint swelling, nausea, neck pain, plugged ear sensation or rash. She has tried acetaminophen for the symptoms. The treatment provided moderate relief.  son and daughter were treated for flu like symptoms this week.  Outpatient Medications Prior to Visit  Medication Sig Dispense Refill  . ALPRAZolam (XANAX) 0.5 MG tablet Take 1 tablet (0.5 mg total) by mouth at bedtime as needed for anxiety or sleep. 30 tablet 1  . amLODipine (NORVASC) 10 MG tablet Take 1 tablet (10 mg total) by mouth daily. 90 tablet 1  . atorvastatin (LIPITOR) 20 MG tablet Take 1 tablet (20 mg total) by mouth daily. 90 tablet 3  . hydrochlorothiazide (HYDRODIURIL) 25 MG tablet Take 1 tablet (25 mg total) by mouth daily. 90 tablet 1  . potassium chloride SA (K-DUR,KLOR-CON) 20 MEQ tablet Take 1 tablet (20 mEq total) by mouth daily. 90 tablet 1   No facility-administered medications prior to visit.     ROS See HPI  Objective:  BP (!) 130/92   Pulse (!) 113   Temp 98.5 F (36.9 C)   Ht 5\' 2"  (1.575 m)   Wt 190 lb (86.2 kg)   SpO2 95%   BMI 34.75 kg/m   BP Readings from Last 3 Encounters:  07/22/16 (!) 130/92  07/01/16 (!) 136/94  02/17/16 129/74    Wt Readings from Last 3 Encounters:  07/22/16 190 lb (86.2 kg)  07/01/16 193 lb (87.5 kg)  02/17/16 180 lb (81.6 kg)    Physical Exam  Constitutional: She is oriented to person, place, and time. No distress.   HENT:  Right Ear: Tympanic membrane, external ear and ear canal normal.  Left Ear: Tympanic membrane, external ear and ear canal normal.  Nose: Mucosal edema and rhinorrhea present. Right sinus exhibits maxillary sinus tenderness. Right sinus exhibits no frontal sinus tenderness. Left sinus exhibits maxillary sinus tenderness. Left sinus exhibits no frontal sinus tenderness.  Mouth/Throat: Uvula is midline. No trismus in the jaw. Posterior oropharyngeal erythema present. No oropharyngeal exudate.  Eyes: No scleral icterus.  Neck: Normal range of motion. Neck supple.  Cardiovascular: Normal rate and normal heart sounds.   Pulmonary/Chest: Effort normal and breath sounds normal. She has no wheezes. She has no rales. She exhibits no tenderness.  Musculoskeletal: She exhibits no edema.  Lymphadenopathy:    She has no cervical adenopathy.  Neurological: She is alert and oriented to person, place, and time.  Skin: Skin is warm and dry.  Vitals reviewed.   Lab Results  Component Value Date   WBC 8.8 02/17/2016   HGB 13.8 02/17/2016   HCT 40.1 02/17/2016   PLT 195 02/17/2016   GLUCOSE 124 (H) 07/01/2016   CHOL 292 (H) 07/01/2016   TRIG 214.0 (H) 07/01/2016   HDL 64.40 07/01/2016   LDLDIRECT 166.0 07/01/2016   LDLCALC 139 (H) 02/25/2015   ALT 17 07/01/2016   AST 16 07/01/2016   NA 138 07/01/2016   K 3.4 (L) 07/01/2016  CL 99 07/01/2016   CREATININE 0.78 07/01/2016   BUN 19 07/01/2016   CO2 29 07/01/2016   TSH 1.23 02/25/2015    Dg Chest 2 View  Result Date: 02/17/2016 CLINICAL DATA:  Sudden onset of chest pain this morning EXAM: CHEST  2 VIEW COMPARISON:  None. FINDINGS: The heart size and mediastinal contours are within normal limits. Both lungs are clear. The visualized skeletal structures are unremarkable. IMPRESSION: No active cardiopulmonary disease. Electronically Signed   By: Andreas Newport M.D.   On: 02/17/2016 03:32    Assessment & Plan:   Ashyah was seen today  for cough.  Diagnoses and all orders for this visit:  Flu-like symptoms -     POCT Influenza A/B -     oseltamivir (TAMIFLU) 75 MG capsule; Take 1 capsule (75 mg total) by mouth 2 (two) times daily. -     promethazine-dextromethorphan (PROMETHAZINE-DM) 6.25-15 MG/5ML syrup; Take 5 mLs by mouth 3 (three) times daily as needed for cough. -     ipratropium (ATROVENT) 0.03 % nasal spray; Place 2 sprays into both nostrils 2 (two) times daily. Do not use for more than 5days.   I am having Ms. Vieau start on oseltamivir, promethazine-dextromethorphan, and ipratropium. I am also having her maintain her atorvastatin, hydrochlorothiazide, amLODipine, ALPRAZolam, and potassium chloride SA.  Meds ordered this encounter  Medications  . oseltamivir (TAMIFLU) 75 MG capsule    Sig: Take 1 capsule (75 mg total) by mouth 2 (two) times daily.    Dispense:  10 capsule    Refill:  0    Order Specific Question:   Supervising Provider    Answer:   Cassandria Anger [[id number]]  . promethazine-dextromethorphan (PROMETHAZINE-DM) 6.25-15 MG/5ML syrup    Sig: Take 5 mLs by mouth 3 (three) times daily as needed for cough.    Dispense:  240 mL    Refill:  0    Order Specific Question:   Supervising Provider    Answer:   Cassandria Anger [[id number]]  . ipratropium (ATROVENT) 0.03 % nasal spray    Sig: Place 2 sprays into both nostrils 2 (two) times daily. Do not use for more than 5days.    Dispense:  30 mL    Refill:  0    Order Specific Question:   Supervising Provider    Answer:   Cassandria Anger [[id number]]    Follow-up: Return if symptoms worsen or fail to improve.  Wilfred Lacy, NP

## 2017-01-05 ENCOUNTER — Ambulatory Visit: Payer: Medicare HMO | Admitting: Nurse Practitioner

## 2017-02-25 ENCOUNTER — Ambulatory Visit (INDEPENDENT_AMBULATORY_CARE_PROVIDER_SITE_OTHER): Payer: Medicare HMO | Admitting: Nurse Practitioner

## 2017-02-25 ENCOUNTER — Encounter: Payer: Self-pay | Admitting: Nurse Practitioner

## 2017-02-25 VITALS — BP 148/90 | HR 79 | Temp 97.6°F | Ht 62.0 in | Wt 194.0 lb

## 2017-02-25 DIAGNOSIS — I10 Essential (primary) hypertension: Secondary | ICD-10-CM | POA: Diagnosis not present

## 2017-02-25 DIAGNOSIS — R69 Illness, unspecified: Secondary | ICD-10-CM | POA: Diagnosis not present

## 2017-02-25 DIAGNOSIS — F419 Anxiety disorder, unspecified: Secondary | ICD-10-CM

## 2017-02-25 DIAGNOSIS — Z23 Encounter for immunization: Secondary | ICD-10-CM

## 2017-02-25 DIAGNOSIS — E782 Mixed hyperlipidemia: Secondary | ICD-10-CM

## 2017-02-25 MED ORDER — ATORVASTATIN CALCIUM 20 MG PO TABS: 20.0000 mg | ORAL_TABLET | Freq: Every day | ORAL | 1 refills | Status: DC

## 2017-02-25 MED ORDER — HYDROCHLOROTHIAZIDE 25 MG PO TABS: 25.0000 mg | ORAL_TABLET | Freq: Every day | ORAL | 1 refills | Status: DC

## 2017-02-25 MED ORDER — POTASSIUM CHLORIDE ER 10 MEQ PO CPCR: 10.0000 meq | ORAL_CAPSULE | Freq: Two times a day (BID) | ORAL | 1 refills | Status: DC

## 2017-02-25 MED ORDER — ALPRAZOLAM 0.5 MG PO TABS: 0.5000 mg | ORAL_TABLET | Freq: Every evening | ORAL | 1 refills | Status: DC | PRN

## 2017-02-25 MED ORDER — AMLODIPINE BESYLATE 10 MG PO TABS: 10.0000 mg | ORAL_TABLET | Freq: Every day | ORAL | 1 refills | Status: DC

## 2017-02-25 NOTE — Patient Instructions (Signed)
Go to basement for blood draw. You will be called with results. 

## 2017-02-25 NOTE — Progress Notes (Signed)
Subjective:  Patient ID: Donna Lawson, female    DOB: [birth year]  Age: 71 y.o. MRN: [id number]  CC: Follow-up (6 mo fu- med refills/ flu shot? xanax is expired in 12/2016)   HPI  HTN: Has not made changes to diet. Will like potassium changed to capsules due to heartburn with tablets.  BP Readings from Last 3 Encounters:  02/25/17 (!) 148/90  07/22/16 (!) 130/92  07/01/16 (!) 136/94   Anxiety: Stable with use of xanax hs prn.  Hyperlipidemia: Denies any adverse effects with atorvastatins.  Outpatient Medications Prior to Visit  Medication Sig Dispense Refill  . ALPRAZolam (XANAX) 0.5 MG tablet Take 1 tablet (0.5 mg total) by mouth at bedtime as needed for anxiety or sleep. 30 tablet 1  . amLODipine (NORVASC) 10 MG tablet Take 1 tablet (10 mg total) by mouth daily. 90 tablet 1  . atorvastatin (LIPITOR) 20 MG tablet Take 1 tablet (20 mg total) by mouth daily. 90 tablet 3  . hydrochlorothiazide (HYDRODIURIL) 25 MG tablet Take 1 tablet (25 mg total) by mouth daily. 90 tablet 1  . potassium chloride SA (K-DUR,KLOR-CON) 20 MEQ tablet Take 1 tablet (20 mEq total) by mouth daily. 90 tablet 1  . ipratropium (ATROVENT) 0.03 % nasal spray Place 2 sprays into both nostrils 2 (two) times daily. Do not use for more than 5days. (Patient not taking: Reported on 02/25/2017) 30 mL 0  . oseltamivir (TAMIFLU) 75 MG capsule Take 1 capsule (75 mg total) by mouth 2 (two) times daily. (Patient not taking: Reported on 02/25/2017) 10 capsule 0  . promethazine-dextromethorphan (PROMETHAZINE-DM) 6.25-15 MG/5ML syrup Take 5 mLs by mouth 3 (three) times daily as needed for cough. (Patient not taking: Reported on 02/25/2017) 240 mL 0   No facility-administered medications prior to visit.     ROS See HPI  Objective:  BP (!) 148/90   Pulse 79   Temp 97.6 F (36.4 C)   Ht 5\' 2"  (1.575 m)   Wt 194 lb (88 kg)   SpO2 97%   BMI 35.48 kg/m   BP Readings from Last 3 Encounters:  02/25/17 (!) 148/90    07/22/16 (!) 130/92  07/01/16 (!) 136/94    Wt Readings from Last 3 Encounters:  02/25/17 194 lb (88 kg)  07/22/16 190 lb (86.2 kg)  07/01/16 193 lb (87.5 kg)    Physical Exam  Constitutional: She is oriented to person, place, and time. No distress.  Cardiovascular: Normal rate and regular rhythm.  Pulmonary/Chest: Effort normal and breath sounds normal.  Musculoskeletal: She exhibits no edema.  Neurological: She is alert and oriented to person, place, and time.  Skin: Skin is warm and dry.  Vitals reviewed.   Lab Results  Component Value Date   WBC 8.8 02/17/2016   HGB 13.8 02/17/2016   HCT 40.1 02/17/2016   PLT 195 02/17/2016   GLUCOSE 124 (H) 07/01/2016   CHOL 292 (H) 07/01/2016   TRIG 214.0 (H) 07/01/2016   HDL 64.40 07/01/2016   LDLDIRECT 166.0 07/01/2016   LDLCALC 139 (H) 02/25/2015   ALT 17 07/01/2016   AST 16 07/01/2016   NA 138 07/01/2016   K 3.4 (L) 07/01/2016   CL 99 07/01/2016   CREATININE 0.78 07/01/2016   BUN 19 07/01/2016   CO2 29 07/01/2016   TSH 1.23 02/25/2015    Dg Chest 2 View  Result Date: 02/17/2016 CLINICAL DATA:  Sudden onset of chest pain this morning EXAM: CHEST  2 VIEW COMPARISON:  None.  FINDINGS: The heart size and mediastinal contours are within normal limits. Both lungs are clear. The visualized skeletal structures are unremarkable. IMPRESSION: No active cardiopulmonary disease. Electronically Signed   By: Andreas Newport M.D.   On: 02/17/2016 03:32    Assessment & Plan:   Rayma was seen today for follow-up.  Diagnoses and all orders for this visit:  Mixed hyperlipidemia -     atorvastatin (LIPITOR) 20 MG tablet; Take 1 tablet (20 mg total) daily at 6 PM by mouth. -     Lipid panel; Future -     Hepatic function panel; Future  Essential hypertension -     potassium chloride (MICRO-K) 10 MEQ CR capsule; Take 1 capsule (10 mEq total) 2 (two) times daily by mouth. -     amLODipine (NORVASC) 10 MG tablet; Take 1 tablet (10  mg total) daily by mouth. -     hydrochlorothiazide (HYDRODIURIL) 25 MG tablet; Take 1 tablet (25 mg total) daily by mouth. -     Basic metabolic panel; Future  Anxiety -     ALPRAZolam (XANAX) 0.5 MG tablet; Take 1 tablet (0.5 mg total) at bedtime as needed by mouth for anxiety or sleep.  Need for influenza vaccination -     Flu vaccine HIGH DOSE PF   I have discontinued Henretter Hegel's potassium chloride SA, oseltamivir, promethazine-dextromethorphan, and ipratropium. I have also changed her ALPRAZolam, amLODipine, atorvastatin, and hydrochlorothiazide. Additionally, I am having her start on potassium chloride.  Meds ordered this encounter  Medications  . potassium chloride (MICRO-K) 10 MEQ CR capsule    Sig: Take 1 capsule (10 mEq total) 2 (two) times daily by mouth.    Dispense:  180 capsule    Refill:  1    Order Specific Question:   Supervising Provider    Answer:   Cassandria Anger [[id number]]  . ALPRAZolam (XANAX) 0.5 MG tablet    Sig: Take 1 tablet (0.5 mg total) at bedtime as needed by mouth for anxiety or sleep.    Dispense:  30 tablet    Refill:  1    [phone] fax number    Order Specific Question:   Supervising Provider    Answer:   Cassandria Anger [[id number]]  . amLODipine (NORVASC) 10 MG tablet    Sig: Take 1 tablet (10 mg total) daily by mouth.    Dispense:  90 tablet    Refill:  1    Discontinue all previous refills for this medication. Hold/Fill when appropriate according to pt rx fill schedule.    Order Specific Question:   Supervising Provider    Answer:   Cassandria Anger [[id number]]  . atorvastatin (LIPITOR) 20 MG tablet    Sig: Take 1 tablet (20 mg total) daily at 6 PM by mouth.    Dispense:  90 tablet    Refill:  1    Discontinue all previous refills for this medication. Hold/Fill when appropriate according to pt rx fill schedule.    Order Specific Question:   Supervising Provider    Answer:   Cassandria Anger [[id number]]  . hydrochlorothiazide  (HYDRODIURIL) 25 MG tablet    Sig: Take 1 tablet (25 mg total) daily by mouth.    Dispense:  90 tablet    Refill:  1    Discontinue all previous refills for this medication. Hold/Fill when appropriate according to pt rx fill schedule.    Order Specific Question:   Supervising Provider    Answer:  PLOTNIKOV, ALEKSEI V [[id number]]    Follow-up: Return in about 6 months (around 08/25/2017) for with Hollie Beach, NP.  Wilfred Lacy, NP

## 2017-02-27 ENCOUNTER — Encounter: Payer: Self-pay | Admitting: Nurse Practitioner

## 2017-04-05 ENCOUNTER — Ambulatory Visit (INDEPENDENT_AMBULATORY_CARE_PROVIDER_SITE_OTHER): Payer: Medicare HMO | Admitting: Family Medicine

## 2017-04-05 ENCOUNTER — Ambulatory Visit: Payer: Self-pay | Admitting: *Deleted

## 2017-04-05 ENCOUNTER — Emergency Department (HOSPITAL_COMMUNITY): Admission: EM | Admit: 2017-04-05 | Discharge: 2017-04-05 | Discharge status: Absent without leave | Payer: Medicare HMO

## 2017-04-05 ENCOUNTER — Other Ambulatory Visit (INDEPENDENT_AMBULATORY_CARE_PROVIDER_SITE_OTHER): Payer: Medicare HMO

## 2017-04-05 ENCOUNTER — Encounter: Payer: Self-pay | Admitting: Family Medicine

## 2017-04-05 VITALS — BP 134/72 | HR 87 | Temp 99.8°F | Ht 62.0 in | Wt 193.0 lb

## 2017-04-05 DIAGNOSIS — R300 Dysuria: Secondary | ICD-10-CM

## 2017-04-05 LAB — URINALYSIS
Ketones, ur: 15 — AB
LEUKOCYTES UA: NEGATIVE
NITRITE: NEGATIVE
SPECIFIC GRAVITY, URINE: 1.02 (ref 1.000–1.030)
UROBILINOGEN UA: 1 (ref 0.0–1.0)
Urine Glucose: NEGATIVE
pH: 6 (ref 5.0–8.0)

## 2017-04-05 NOTE — Patient Instructions (Signed)
We will call you with the results from today.  If warranted then I will send in an antibiotic.

## 2017-04-05 NOTE — Progress Notes (Signed)
Donna Lawson - 71 y.o. female MRN [id number]  Date of birth: 10/23/1945  SUBJECTIVE:  Including CC & ROS.  Chief Complaint  Patient presents with  . Dysuria    Donna Lawson is a 71 y.o. female that is presenting with dysuria. The pain has been ongoing since 04/02/17. She states the pain occurs before she urinates and is located lower left abdomen. She has not taken anything for the pain. Admits to body chills and fevers. She reports a distant history of a urinary infection. No longer sexually active. Denies any recent antibiotic use. No history of kidney stones    Review of Systems  Constitutional: Positive for fever.  Gastrointestinal: Negative for constipation and diarrhea.  Genitourinary: Positive for dysuria. Negative for flank pain.    HISTORY: Past Medical, Surgical, Social, and Family History Reviewed & Updated per EMR.   Pertinent Historical Findings include:  Past Medical History:  Diagnosis Date  . Anxiety   . Arthritis   . Diverticula, colon 1999  . Heart murmur   . History of blood transfusion   . Hyperlipidemia   . Hypertension   . Osteoarthritis of knee    right    Past Surgical History:  Procedure Laterality Date  . ABDOMINAL SURGERY    . COLONOSCOPY    . NO PAST SURGERIES      Allergies  Allergen Reactions  . Lisinopril     Tongue swelling    Family History  Problem Relation Age of Onset  . Arthritis Mother   . Arthritis Father   . Hypertension Other      Social History   Socioeconomic History  . Marital status: Widowed    Spouse name: Not on file  . Number of children: Not on file  . Years of education: Not on file  . Highest education level: Not on file  Social Needs  . Financial resource strain: Not on file  . Food insecurity - worry: Not on file  . Food insecurity - inability: Not on file  . Transportation needs - medical: Not on file  . Transportation needs - non-medical: Not on file  Occupational History  . Not on file   Tobacco Use  . Smoking status: Former Research scientist (life sciences)  . Smokeless tobacco: Never Used  Substance and Sexual Activity  . Alcohol use: No    Alcohol/week: 0.0 oz  . Drug use: No  . Sexual activity: Not on file  Other Topics Concern  . Not on file  Social History Narrative   Widowed, lives alone with 2 dogs     PHYSICAL EXAM:  VS: BP 134/72 (BP Location: Left Arm, Patient Position: Sitting, Cuff Size: Normal)   Pulse 87   Temp 99.8 F (37.7 C) (Oral)   Ht 5\' 2"  (1.575 m)   Wt 193 lb (87.5 kg)   SpO2 98%   BMI 35.30 kg/m  Physical Exam Gen: NAD, alert, cooperative with exam,  ENT: normal lips, normal nasal mucosa,  Eye: normal EOM, normal conjunctiva and lids CV:  no edema, +2 pedal pulses   Resp: no accessory muscle use, non-labored,  GI: no masses, mild suprapubic tenderness, no hernia  Skin: no rashes, no areas of induration  Neuro: normal tone, normal sensation to touch Psych:  normal insight, alert and oriented MSK: normal gait, normal strength      ASSESSMENT & PLAN:   Dysuria Hasn't a UTI in several years. Possible for infection  - urinalysis and urine culture.

## 2017-04-05 NOTE — Telephone Encounter (Signed)
C/o having a fever 101.  Urinating more frequently.  Having pain in left side when her bladder is full but after urinating the pain resolves.  I just feel so bad. Reason for Disposition . Urinating more frequently than usual (i.e., frequency)  Answer Assessment - Initial Assessment Questions 1. SYMPTOM: "What's the main symptom you're concerned about?" (e.g., frequency, incontinence)     When my bladder fills up I get pain in my left side.   After I urinate the pain in my side goes away.     No burning with urination.   I've had an urgency to urinate over the last 2 wks. 2. ONSET: "When did the  ________  start?"     Yesterday was the most severe. 3. PAIN: "Is there any pain?" If so, ask: "How bad is it?" (Scale: 1-10; mild, moderate, severe)     Pain in left side when bladder is full.   When I touch my abd it hurts. 4. CAUSE: "What do you think is causing the symptoms?"     I think I have a bladder infection 5. OTHER SYMPTOMS: "Do you have any other symptoms?" (e.g., fever, flank pain, blood in urine, pain with urination)     Fever 101.   No blood.  It's cloudy. 6. PREGNANCY: "Is there any chance you are pregnant?" "When was your last menstrual period?"     Not asked  Protocols used: URINARY [facility]

## 2017-04-06 DIAGNOSIS — R300 Dysuria: Secondary | ICD-10-CM | POA: Insufficient documentation

## 2017-04-06 LAB — URINE CULTURE
MICRO NUMBER:: 81421543
SPECIMEN QUALITY:: ADEQUATE

## 2017-04-06 NOTE — Assessment & Plan Note (Signed)
Hasn't a UTI in several years. Possible for infection  - urinalysis and urine culture.

## 2017-05-25 DIAGNOSIS — E785 Hyperlipidemia, unspecified: Secondary | ICD-10-CM | POA: Diagnosis not present

## 2017-05-25 DIAGNOSIS — G4700 Insomnia, unspecified: Secondary | ICD-10-CM | POA: Diagnosis not present

## 2017-05-25 DIAGNOSIS — I10 Essential (primary) hypertension: Secondary | ICD-10-CM | POA: Diagnosis not present

## 2017-05-25 DIAGNOSIS — Z1389 Encounter for screening for other disorder: Secondary | ICD-10-CM | POA: Diagnosis not present

## 2018-07-26 ENCOUNTER — Telehealth: Payer: Self-pay | Admitting: Nurse Practitioner

## 2018-07-26 NOTE — Telephone Encounter (Signed)
I called patient per Donna Lawson request to schedule patient for follow up for HTN. Patient number is no longer in service, unable to schedule.

## 2018-08-06 ENCOUNTER — Other Ambulatory Visit: Payer: Self-pay | Admitting: Nurse Practitioner

## 2018-08-06 DIAGNOSIS — I10 Essential (primary) hypertension: Secondary | ICD-10-CM

## 2018-08-08 DIAGNOSIS — I10 Essential (primary) hypertension: Secondary | ICD-10-CM | POA: Diagnosis not present

## 2018-08-08 DIAGNOSIS — G4700 Insomnia, unspecified: Secondary | ICD-10-CM | POA: Diagnosis not present

## 2018-08-08 DIAGNOSIS — E785 Hyperlipidemia, unspecified: Secondary | ICD-10-CM | POA: Diagnosis not present

## 2019-02-23 DIAGNOSIS — M5431 Sciatica, right side: Secondary | ICD-10-CM | POA: Diagnosis not present

## 2019-02-23 DIAGNOSIS — Z23 Encounter for immunization: Secondary | ICD-10-CM | POA: Diagnosis not present

## 2019-02-23 DIAGNOSIS — E049 Nontoxic goiter, unspecified: Secondary | ICD-10-CM | POA: Diagnosis not present

## 2019-02-23 DIAGNOSIS — Z1389 Encounter for screening for other disorder: Secondary | ICD-10-CM | POA: Diagnosis not present

## 2019-02-23 DIAGNOSIS — E785 Hyperlipidemia, unspecified: Secondary | ICD-10-CM | POA: Diagnosis not present

## 2019-02-23 DIAGNOSIS — G4700 Insomnia, unspecified: Secondary | ICD-10-CM | POA: Diagnosis not present

## 2019-02-23 DIAGNOSIS — I10 Essential (primary) hypertension: Secondary | ICD-10-CM | POA: Diagnosis not present

## 2019-02-23 DIAGNOSIS — H9201 Otalgia, right ear: Secondary | ICD-10-CM | POA: Diagnosis not present

## 2019-02-23 DIAGNOSIS — Z6837 Body mass index (BMI) 37.0-37.9, adult: Secondary | ICD-10-CM | POA: Diagnosis not present

## 2019-02-27 ENCOUNTER — Other Ambulatory Visit: Payer: Self-pay | Admitting: Family Medicine

## 2019-02-27 DIAGNOSIS — E010 Iodine-deficiency related diffuse (endemic) goiter: Secondary | ICD-10-CM

## 2019-03-14 ENCOUNTER — Other Ambulatory Visit: Payer: Medicare HMO

## 2019-03-20 ENCOUNTER — Other Ambulatory Visit: Payer: Self-pay | Admitting: Family Medicine

## 2019-03-20 DIAGNOSIS — Z139 Encounter for screening, unspecified: Secondary | ICD-10-CM

## 2019-06-13 DIAGNOSIS — J0110 Acute frontal sinusitis, unspecified: Secondary | ICD-10-CM | POA: Diagnosis not present

## 2019-07-03 ENCOUNTER — Encounter (HOSPITAL_COMMUNITY): Payer: Self-pay | Admitting: Emergency Medicine

## 2019-07-03 ENCOUNTER — Emergency Department (HOSPITAL_COMMUNITY): Payer: Medicare HMO

## 2019-07-03 ENCOUNTER — Other Ambulatory Visit: Payer: Self-pay

## 2019-07-03 ENCOUNTER — Emergency Department (HOSPITAL_COMMUNITY)
Admission: EM | Admit: 2019-07-03 | Discharge: 2019-07-03 | Disposition: A | Discharge status: Home / Self Care | Payer: Medicare HMO | Attending: Emergency Medicine | Admitting: Emergency Medicine

## 2019-07-03 DIAGNOSIS — Z20822 Contact with and (suspected) exposure to covid-19: Secondary | ICD-10-CM | POA: Diagnosis not present

## 2019-07-03 DIAGNOSIS — J069 Acute upper respiratory infection, unspecified: Secondary | ICD-10-CM

## 2019-07-03 DIAGNOSIS — R918 Other nonspecific abnormal finding of lung field: Secondary | ICD-10-CM | POA: Diagnosis not present

## 2019-07-03 DIAGNOSIS — E041 Nontoxic single thyroid nodule: Secondary | ICD-10-CM

## 2019-07-03 DIAGNOSIS — Z87891 Personal history of nicotine dependence: Secondary | ICD-10-CM | POA: Insufficient documentation

## 2019-07-03 DIAGNOSIS — Z79899 Other long term (current) drug therapy: Secondary | ICD-10-CM | POA: Insufficient documentation

## 2019-07-03 DIAGNOSIS — I10 Essential (primary) hypertension: Secondary | ICD-10-CM | POA: Insufficient documentation

## 2019-07-03 DIAGNOSIS — R05 Cough: Secondary | ICD-10-CM | POA: Diagnosis not present

## 2019-07-03 LAB — COMPREHENSIVE METABOLIC PANEL
ALT: 23 U/L (ref 0–44)
AST: 16 U/L (ref 15–41)
Albumin: 3.7 g/dL (ref 3.5–5.0)
Alkaline Phosphatase: 194 U/L — ABNORMAL HIGH (ref 38–126)
Anion gap: 13 (ref 5–15)
BUN: 8 mg/dL (ref 8–23)
CO2: 27 mmol/L (ref 22–32)
Calcium: 9.3 mg/dL (ref 8.9–10.3)
Chloride: 102 mmol/L (ref 98–111)
Creatinine, Ser: 0.55 mg/dL (ref 0.44–1.00)
GFR calc Af Amer: >60 mL/min (ref >60)
GFR calc non Af Amer: >60 mL/min (ref >60)
Glucose, Bld: 128 mg/dL — ABNORMAL HIGH (ref 70–99)
Potassium: 3.3 mmol/L — ABNORMAL LOW (ref 3.5–5.1)
Sodium: 142 mmol/L (ref 135–145)
Total Bilirubin: 0.7 mg/dL (ref 0.3–1.2)
Total Protein: 7.6 g/dL (ref 6.5–8.1)

## 2019-07-03 LAB — CBC WITH DIFFERENTIAL/PLATELET
Abs Immature Granulocytes: 0.02 10*3/uL (ref 0.00–0.07)
Basophils Absolute: 0 10*3/uL (ref 0.0–0.1)
Basophils Relative: 1 %
Eosinophils Absolute: 0.1 10*3/uL (ref 0.0–0.5)
Eosinophils Relative: 2 %
HCT: 34.1 % — ABNORMAL LOW (ref 36.0–46.0)
Hemoglobin: 11 g/dL — ABNORMAL LOW (ref 12.0–15.0)
Immature Granulocytes: 0 %
Lymphocytes Relative: 17 %
Lymphs Abs: 1 10*3/uL (ref 0.7–4.0)
MCH: 26.8 pg (ref 26.0–34.0)
MCHC: 32.3 g/dL (ref 30.0–36.0)
MCV: 83.2 fL (ref 80.0–100.0)
Monocytes Absolute: 0.6 10*3/uL (ref 0.1–1.0)
Monocytes Relative: 9 %
Neutro Abs: 4.3 10*3/uL (ref 1.7–7.7)
Neutrophils Relative %: 71 %
Platelets: 305 10*3/uL (ref 150–400)
RBC: 4.1 MIL/uL (ref 3.87–5.11)
RDW: 13.7 % (ref 11.5–15.5)
WBC: 6 10*3/uL (ref 4.0–10.5)
nRBC: 0 % (ref 0.0–0.2)

## 2019-07-03 LAB — SARS CORONAVIRUS 2 (TAT 6-24 HRS): SARS Coronavirus 2: NEGATIVE

## 2019-07-03 MED ORDER — IOHEXOL 350 MG/ML SOLN
100.0000 mL | Freq: Once | INTRAVENOUS | Status: AC | PRN
  Administered 2019-07-03: 100 mL via INTRAVENOUS

## 2019-07-03 MED ORDER — POTASSIUM CHLORIDE CRYS ER 20 MEQ PO TBCR: 40.0000 meq | EXTENDED_RELEASE_TABLET | Freq: Once | ORAL | Status: DC

## 2019-07-03 NOTE — ED Notes (Signed)
Pt left unit prior to medication administration.

## 2019-07-03 NOTE — ED Notes (Signed)
Pt able to ambulate to bathroom, standby assistance.

## 2019-07-03 NOTE — Discharge Instructions (Signed)
If you develop high fever, severe cough or cough with blood, trouble breathing, severe headache, neck pain/stiffness, vomiting, or any other new/concerning symptoms then return to the ER for evaluation   Your CT scan shows a thyroid nodule. You need to get an outpatient ultrasound that your PCP can set up.

## 2019-07-03 NOTE — ED Provider Notes (Signed)
[facility] DEPT Provider Note   CSN: [id number] Arrival date & time: 07/03/19  1110     History Chief Complaint  Patient presents with  . Cough  . Nasal Congestion    Donna Lawson is a 74 y.o. female.  HPI 74 year old female presents with cough.  Has been battling a sinus infection for about a week and a half.  Finished amoxicillin and was changed to cefuroxime.  Now she is having relief of her sinuses and postnasal drip.  She thinks this is causing her to have a cough since last night.  During this whole time she is had bilateral neck pain up under her ear.  This is unchanged.  Headache from the sinuses has improved.  No fever.  No shortness of breath.  She is here to figure out why she is still having symptoms.  Has received both doses of Covid vaccine.   Past Medical History:  Diagnosis Date  . Anxiety   . Arthritis   . Diverticula, colon 1999  . Heart murmur   . History of blood transfusion   . Hyperlipidemia   . Hypertension   . Osteoarthritis of knee    right    Patient Active Problem List   Diagnosis Date Noted  . Dysuria 04/06/2017  . Anxiety 03/29/2013  . Hypertension   . Hyperlipidemia   . Osteoarthritis of knee   . Obese     Past Surgical History:  Procedure Laterality Date  . ABDOMINAL SURGERY    . COLONOSCOPY    . NO PAST SURGERIES       OB History   No obstetric history on file.     Family History  Problem Relation Age of Onset  . Arthritis Mother   . Arthritis Father   . Hypertension Other     Social History   Tobacco Use  . Smoking status: Former Research scientist (life sciences)  . Smokeless tobacco: Never Used  Substance Use Topics  . Alcohol use: No    Alcohol/week: 0.0 standard drinks  . Drug use: No    Home Medications Prior to Admission medications   Medication Sig Start Date End Date Taking? Authorizing Provider  acetaminophen (TYLENOL) 500 MG tablet Take 500 mg by mouth every 6 (six) hours as needed for  moderate pain or headache.   Yes [provider]  ALPRAZolam (XANAX) 0.5 MG tablet Take 1 tablet (0.5 mg total) at bedtime as needed by mouth for anxiety or sleep. 02/25/17  Yes Nche, Charlene Brooke, NP  amLODipine (NORVASC) 10 MG tablet Take 1 tablet (10 mg total) daily by mouth. 02/25/17  Yes Nche, Charlene Brooke, NP  atorvastatin (LIPITOR) 20 MG tablet Take 1 tablet (20 mg total) daily at 6 PM by mouth. 02/25/17  Yes Nche, Charlene Brooke, NP  cefUROXime (CEFTIN) 500 MG tablet Take 500 mg by mouth 2 (two) times daily with a meal.  06/28/19  Yes [provider]  hydrochlorothiazide (HYDRODIURIL) 25 MG tablet Take 1 tablet (25 mg total) daily by mouth. 02/25/17  Yes Nche, Charlene Brooke, NP  potassium chloride (MICRO-K) 10 MEQ CR capsule Take 1 capsule (10 mEq total) 2 (two) times daily by mouth. Patient not taking: Reported on 07/03/2019 02/25/17   Nche, Charlene Brooke, NP  lisinopril-hydrochlorothiazide (PRINZIDE,ZESTORETIC) 20-25 MG per tablet Take 1 tablet by mouth 2 (two) times daily. Take 1 by mouth daily 03/17/12 05/10/12  [provider]    Allergies    Lisinopril  Review of Systems  Review of Systems  Constitutional: Negative for fever.  HENT: Positive for postnasal drip and sinus pain. Negative for sore throat.   Respiratory: Positive for cough. Negative for shortness of breath.   Cardiovascular: Negative for chest pain.  Musculoskeletal: Positive for neck pain.  All other systems reviewed and are negative.   Physical Exam Updated Vital Signs BP (!) 173/86   Pulse 84   Temp 99.7 F (37.6 C) (Oral)   Resp 17   SpO2 97%   Physical Exam Vitals and nursing note reviewed.  Constitutional:      Appearance: She is well-developed. She is obese.  HENT:     Head: Normocephalic and atraumatic.     Right Ear: External ear normal.     Left Ear: External ear normal.     Nose: Nose normal.     Mouth/Throat:     Pharynx: No oropharyngeal exudate or posterior  oropharyngeal erythema.  Eyes:     General:        Right eye: No discharge.        Left eye: No discharge.  Cardiovascular:     Rate and Rhythm: Normal rate and regular rhythm.     Heart sounds: Normal heart sounds.  Pulmonary:     Effort: Pulmonary effort is normal.     Breath sounds: Normal breath sounds.  Abdominal:     General: There is no distension.  Musculoskeletal:     Cervical back: Normal range of motion. No rigidity or tenderness.  Lymphadenopathy:     Cervical: No cervical adenopathy.  Skin:    General: Skin is warm and dry.  Neurological:     Mental Status: She is alert.  Psychiatric:        Mood and Affect: Mood is not anxious.     ED Results / Procedures / Treatments   Labs (all labs ordered are listed, but only abnormal results are displayed) Labs Reviewed  COMPREHENSIVE METABOLIC PANEL - Abnormal; Notable for the following components:      Result Value   Potassium 3.3 (*)    Glucose, Bld 128 (*)    Alkaline Phosphatase 194 (*)    All other components within normal limits  CBC WITH DIFFERENTIAL/PLATELET - Abnormal; Notable for the following components:   Hemoglobin 11.0 (*)    HCT 34.1 (*)    All other components within normal limits  SARS CORONAVIRUS 2 (TAT 6-24 HRS)    EKG None  Radiology DG Chest Portable 1 View  Result Date: 07/03/2019 CLINICAL DATA:  Cough EXAM: PORTABLE CHEST 1 VIEW COMPARISON:  02/17/2016 FINDINGS: The heart size is within normal limits. Lobulated contour overlying the right hilum/right heart border possibly representing a tortuous ascending aorta. No focal airspace consolidation, pleural effusion, or pneumothorax. The visualized skeletal structures are unremarkable. IMPRESSION: 1. No acute cardiopulmonary disease. 2. Lobulated contour overlying the right hilum/right heart border possibly representing a tortuous ascending aorta. Further evaluation can be performed with a nonemergent CTA of the chest if no recent cross-sectional  imaging is available for comparison. Electronically Signed   By: Davina Poke D.O.   On: 07/03/2019 13:11   CT ANGIO CHEST AORTA W/CM & OR WO/CM  Result Date: 07/03/2019 CLINICAL DATA:  Abnormal chest x-ray EXAM: CT ANGIOGRAPHY CHEST WITH CONTRAST TECHNIQUE: Multidetector CT imaging of the chest was performed using the standard protocol during bolus administration of intravenous contrast. Multiplanar CT image reconstructions and MIPs were obtained to evaluate the vascular anatomy. CONTRAST:  119mL OMNIPAQUE IOHEXOL  350 MG/ML SOLN COMPARISON:  Chest x-ray today FINDINGS: Cardiovascular: Heart is normal size. Scattered coronary artery and aortic calcifications. No evidence of aortic aneurysm or dissection. No filling defects in the pulmonary arteries to suggest pulmonary emboli. Mediastinum/Nodes: No mediastinal, hilar, or axillary adenopathy. Trachea and esophagus are unremarkable. 3.7 cm predominantly cystic mass in the right thyroid lobe. Lungs/Pleura: No confluent opacities or effusions. Scarring in the anterior right upper lobe. Upper Abdomen: Imaging into the upper abdomen shows no acute findings. Musculoskeletal: Chest wall soft tissues are unremarkable. No acute bony abnormality. Review of the MIP images confirms the above findings. IMPRESSION: No evidence of aortic aneurysm or dissection. No right hilar mass or acute cardiopulmonary disease. Coronary artery disease. 3.7 cm predominantly cystic right thyroid mass. Recommend non emergent elective outpatient thyroid US (ref: J Am Coll Radiol. 2015 Feb;12(2): 143-50). Aortic Atherosclerosis (ICD10-I70.0). Electronically Signed   By: Rolm Baptise M.D.   On: 07/03/2019 17:02    Procedures Procedures (including critical care time)  Medications Ordered in ED Medications  potassium chloride SA (KLOR-CON) CR tablet 40 mEq (has no administration in time range)  iohexol (OMNIPAQUE) 350 MG/ML injection 100 mL (100 mLs Intravenous Contrast Given 07/03/19  1633)    ED Course  I have reviewed the triage vital signs and the nursing notes.  Pertinent labs & imaging results that were available during my care of the patient were reviewed by me and considered in my medical decision making (see chart for details).    MDM Rules/Calculators/A&P                      Patient appears to have an upper respiratory infection.  While she now has cough, she otherwise has clear lungs and no pneumonia.  Chest x-ray showed possible mass versus aortic problem so CT was obtained but shows no emergent findings.  There is a thyroid nodule which she will need to have followed up as an outpatient and has been made aware of.  Otherwise this neck pain is very nonspecific and bilateral, probably lymphadenopathy.  Highly doubt deep space neck infection.  Advised her to follow-up with PCP.  She has been tested for Covid.  Donna Lawson was evaluated in Emergency Department on 07/03/2019 for the symptoms described in the history of present illness. She was evaluated in the context of the global COVID-19 pandemic, which necessitated consideration that the patient might be at risk for infection with the SARS-CoV-2 virus that causes COVID-19. Institutional protocols and algorithms that pertain to the evaluation of patients at risk for COVID-19 are in a state of rapid change based on information released by regulatory bodies including the CDC and federal and state organizations. These policies and algorithms were followed during the patient's care in the ED.  Final Clinical Impression(s) / ED Diagnoses Final diagnoses:  Acute upper respiratory infection  Thyroid nodule    Rx / DC Orders ED Discharge Orders    None       Sherwood Gambler, MD 07/03/19 1710

## 2019-07-03 NOTE — ED Triage Notes (Signed)
Pt reports had sinus infection and was under doctor's care for it. Reports feels that now moved down towards her neck. Still having nasal drip and cough with clear mucous.

## 2019-08-13 DIAGNOSIS — I10 Essential (primary) hypertension: Secondary | ICD-10-CM | POA: Diagnosis not present

## 2019-08-13 DIAGNOSIS — Z6837 Body mass index (BMI) 37.0-37.9, adult: Secondary | ICD-10-CM | POA: Diagnosis not present

## 2019-08-13 DIAGNOSIS — Z0000 Encounter for general adult medical examination without abnormal findings: Secondary | ICD-10-CM | POA: Diagnosis not present

## 2019-08-13 DIAGNOSIS — G4700 Insomnia, unspecified: Secondary | ICD-10-CM | POA: Diagnosis not present

## 2019-08-13 DIAGNOSIS — R05 Cough: Secondary | ICD-10-CM | POA: Diagnosis not present

## 2019-08-13 DIAGNOSIS — Z1389 Encounter for screening for other disorder: Secondary | ICD-10-CM | POA: Diagnosis not present

## 2019-08-13 DIAGNOSIS — E785 Hyperlipidemia, unspecified: Secondary | ICD-10-CM | POA: Diagnosis not present

## 2019-08-13 DIAGNOSIS — R7301 Impaired fasting glucose: Secondary | ICD-10-CM | POA: Diagnosis not present

## 2019-08-13 DIAGNOSIS — Z1211 Encounter for screening for malignant neoplasm of colon: Secondary | ICD-10-CM | POA: Diagnosis not present

## 2019-08-16 ENCOUNTER — Other Ambulatory Visit: Payer: Medicare HMO

## 2019-08-31 ENCOUNTER — Ambulatory Visit
Admission: RE | Admit: 2019-08-31 | Discharge: 2019-08-31 | Disposition: A | Discharge status: Home / Self Care | Payer: Medicare HMO | Source: Ambulatory Visit | Attending: Family Medicine | Admitting: Family Medicine

## 2019-08-31 DIAGNOSIS — E041 Nontoxic single thyroid nodule: Secondary | ICD-10-CM | POA: Diagnosis not present

## 2019-08-31 DIAGNOSIS — E010 Iodine-deficiency related diffuse (endemic) goiter: Secondary | ICD-10-CM

## 2019-09-05 ENCOUNTER — Other Ambulatory Visit: Payer: Self-pay | Admitting: Family Medicine

## 2019-09-06 ENCOUNTER — Other Ambulatory Visit: Payer: Self-pay | Admitting: Family Medicine

## 2019-09-06 DIAGNOSIS — E041 Nontoxic single thyroid nodule: Secondary | ICD-10-CM

## 2019-09-19 DIAGNOSIS — N390 Urinary tract infection, site not specified: Secondary | ICD-10-CM | POA: Diagnosis not present

## 2019-10-16 ENCOUNTER — Other Ambulatory Visit (HOSPITAL_COMMUNITY)
Admission: RE | Admit: 2019-10-16 | Discharge: 2019-10-16 | Disposition: A | Discharge status: Home / Self Care | Payer: Medicare HMO | Source: Ambulatory Visit | Attending: Interventional Radiology | Admitting: Interventional Radiology

## 2019-10-16 ENCOUNTER — Ambulatory Visit
Admission: RE | Admit: 2019-10-16 | Discharge: 2019-10-16 | Disposition: A | Discharge status: Home / Self Care | Payer: Medicare HMO | Source: Ambulatory Visit | Attending: Family Medicine | Admitting: Family Medicine

## 2019-10-16 DIAGNOSIS — E041 Nontoxic single thyroid nodule: Secondary | ICD-10-CM | POA: Insufficient documentation

## 2019-10-16 DIAGNOSIS — E079 Disorder of thyroid, unspecified: Secondary | ICD-10-CM | POA: Diagnosis not present

## 2019-10-18 LAB — CYTOLOGY - NON PAP

## 2019-10-19 DIAGNOSIS — E041 Nontoxic single thyroid nodule: Secondary | ICD-10-CM | POA: Diagnosis not present

## 2019-10-30 ENCOUNTER — Encounter (HOSPITAL_COMMUNITY): Payer: Self-pay

## 2019-11-05 DIAGNOSIS — E041 Nontoxic single thyroid nodule: Secondary | ICD-10-CM | POA: Diagnosis not present

## 2019-11-19 ENCOUNTER — Ambulatory Visit (INDEPENDENT_AMBULATORY_CARE_PROVIDER_SITE_OTHER): Payer: Medicare HMO | Admitting: Otolaryngology

## 2019-11-19 ENCOUNTER — Other Ambulatory Visit: Payer: Self-pay

## 2019-11-19 DIAGNOSIS — K219 Gastro-esophageal reflux disease without esophagitis: Secondary | ICD-10-CM

## 2019-11-19 NOTE — Progress Notes (Signed)
HPI: Donna Lawson is a 74 y.o. female who presents for evaluation of a chronic cough that she has had for several months.  She is also recently undergone ultrasound and FNA of a 4 cm right thyroid nodule.  Apparently on FNA this was Bethesda category III and was sent for molecular testing which was negative according to the patient.  The ultrasound showed a approximate 4 cm solid right thyroid nodule.  She has had no hoarseness. She has had history of GE reflux disease and used to take antiacid medication.  But presently not taking any antiacid medication. She chronically tries to clear her throat and coughs.  Past Medical History:  Diagnosis Date  . Anxiety   . Arthritis   . Diverticula, colon 1999  . Heart murmur   . History of blood transfusion   . Hyperlipidemia   . Hypertension   . Osteoarthritis of knee    right   Past Surgical History:  Procedure Laterality Date  . ABDOMINAL SURGERY    . COLONOSCOPY    . NO PAST SURGERIES     Social History   Socioeconomic History  . Marital status: Widowed    Spouse name: Not on file  . Number of children: Not on file  . Years of education: Not on file  . Highest education level: Not on file  Occupational History  . Not on file  Tobacco Use  . Smoking status: Former Research scientist (life sciences)  . Smokeless tobacco: Never Used  Substance and Sexual Activity  . Alcohol use: No    Alcohol/week: 0.0 standard drinks  . Drug use: No  . Sexual activity: Not on file  Other Topics Concern  . Not on file  Social History Narrative   Widowed, lives alone with 2 dogs   Social Determinants of Health   Financial Resource Strain:   . Difficulty of Paying Living Expenses:   Food Insecurity:   . Worried About Charity fundraiser in the Last Year:   . Arboriculturist in the Last Year:   Transportation Needs:   . Film/video editor (Medical):   Marland Kitchen Lack of Transportation (Non-Medical):   Physical Activity:   . Days of Exercise per Week:   . Minutes of  Exercise per Session:   Stress:   . Feeling of Stress :   Social Connections:   . Frequency of Communication with Friends and Family:   . Frequency of Social Gatherings with Friends and Family:   . Attends Religious Services:   . Active Member of Clubs or Organizations:   . Attends Archivist Meetings:   Marland Kitchen Marital Status:    Family History  Problem Relation Age of Onset  . Arthritis Mother   . Arthritis Father   . Hypertension Other    Allergies  Allergen Reactions  . Lisinopril     Tongue swelling   Prior to Admission medications   Medication Sig Start Date End Date Taking? Authorizing Provider  acetaminophen (TYLENOL) 500 MG tablet Take 500 mg by mouth every 6 (six) hours as needed for moderate pain or headache.    [provider]  ALPRAZolam Duanne Moron) 0.5 MG tablet Take 1 tablet (0.5 mg total) at bedtime as needed by mouth for anxiety or sleep. 02/25/17   Nche, Charlene Brooke, NP  amLODipine (NORVASC) 10 MG tablet Take 1 tablet (10 mg total) daily by mouth. 02/25/17   Nche, Charlene Brooke, NP  atorvastatin (LIPITOR) 20 MG tablet Take 1 tablet (20  mg total) daily at 6 PM by mouth. 02/25/17   Nche, Charlene Brooke, NP  cefUROXime (CEFTIN) 500 MG tablet Take 500 mg by mouth 2 (two) times daily with a meal.  06/28/19   [provider]  hydrochlorothiazide (HYDRODIURIL) 25 MG tablet Take 1 tablet (25 mg total) daily by mouth. 02/25/17   Nche, Charlene Brooke, NP  potassium chloride (MICRO-K) 10 MEQ CR capsule Take 1 capsule (10 mEq total) 2 (two) times daily by mouth. Patient not taking: Reported on 07/03/2019 02/25/17   Nche, Charlene Brooke, NP  lisinopril-hydrochlorothiazide (PRINZIDE,ZESTORETIC) 20-25 MG per tablet Take 1 tablet by mouth 2 (two) times daily. Take 1 by mouth daily 03/17/12 05/10/12  [provider]     Positive ROS: Otherwise negative  All other systems have been reviewed and were otherwise negative with the exception of those mentioned in  the HPI and as above.  Physical Exam: Constitutional: Alert, well-appearing, no acute distress Ears: External ears without lesions or tenderness. Ear canals are clear bilaterally with intact, clear TMs.  Nasal: External nose without lesions. Septum with minimal deformity and mild rhinitis with clear mucus discharge.  Both middle meatus regions were clear.. Clear nasal passages Oral: Lips and gums without lesions. Tongue and palate mucosa without lesions. Posterior oropharynx clear.  Patient is status post tonsillectomy with small amount of residual tonsillar tissue which is benign in appearance. Indirect laryngoscopy revealed a clear base of tongue vallecula and epiglottis.  Vocal cords were clear bilaterally with normal vocal cord mobility.  She had mild arytenoid edema but clear piriform sinuses bilaterally. Neck: No palpable adenopathy.  The right thyroid nodule is easily palpable.  There is no supraclavicular adenopathy. Respiratory: Breathing comfortably  Skin: No facial/neck lesions or rash noted.  Procedures  Assessment: They inquired about whether the thyroid nodule might cause her cough.  Discussed with him that it is unlikely that the thyroid nodule would cause her to cough. Her cough may be related to laryngeal pharyngeal reflux and would recommend initially treatment for this.  Plan: I prescribed omeprazole 40 mg daily before dinner for the next 2 months. Would recommend obtaining repeat ultrasound in a year to see if the nodule enlarges. They will follow-up as needed.  Radene Journey, MD

## 2020-01-14 ENCOUNTER — Ambulatory Visit: Payer: Medicare HMO | Admitting: Pulmonary Disease

## 2020-01-14 ENCOUNTER — Other Ambulatory Visit: Payer: Self-pay

## 2020-01-14 ENCOUNTER — Encounter: Payer: Self-pay | Admitting: Pulmonary Disease

## 2020-01-14 VITALS — BP 138/76 | HR 108 | Temp 98.5°F | Ht 62.0 in | Wt 169.6 lb

## 2020-01-14 DIAGNOSIS — R053 Chronic cough: Secondary | ICD-10-CM

## 2020-01-14 DIAGNOSIS — R05 Cough: Secondary | ICD-10-CM | POA: Diagnosis not present

## 2020-01-14 MED ORDER — BENZONATATE 200 MG PO CAPS: 200.0000 mg | ORAL_CAPSULE | Freq: Three times a day (TID) | ORAL | 1 refills | Status: DC | PRN

## 2020-01-14 MED ORDER — IPRATROPIUM BROMIDE 0.03 % NA SOLN: 2.0000 | Freq: Two times a day (BID) | NASAL | 12 refills | Status: DC

## 2020-01-14 MED ORDER — OMEPRAZOLE 40 MG PO CPDR: DELAYED_RELEASE_CAPSULE | ORAL | 6 refills | Status: DC

## 2020-01-14 NOTE — Patient Instructions (Addendum)
Continue omperazole for GERD Elevated the head of your bed with blocks for GERD Start ipratropium nasal spray 2 times daily if needed or only in the evening before bed Take tessalon Perles for cough as needed

## 2020-01-14 NOTE — Progress Notes (Signed)
Synopsis: Referred by Donald Prose, MD for chronic cough  Subjective:   PATIENT ID: Donna Lawson GENDER: female DOB: 1945/06/05, MRN: [id number]   HPI  Chief Complaint  Patient presents with  . Consult    Cough after she eats   Donna Lawson is a 74 year old woman, former smoker with history of allergies and GERD who is referred to pulmonary clinic for cough.   She has had a cough for 10 months that is dry. She sometimes feels she has chest congestion that is difficult to cough up. She reports having increasing trouble to seasonal allergies and has been started on zyrtec without much relief. She was also started on flonase but then developed epistaxis. She does complain of post nasal drip. She was evaluated by ENT on 11/19/19 which exam showed mild arytenoid edema and normal vocal cord mobility. There was concern for laryngopharyngeal reflux and she was initiated on omeprazole therapy which has improved her cough somewhat. The cough is worse when lying down and after eating dinner. She denies increased cough while eating or drinking. She denies wheezing or shortness of breath.   She has a thyroid nodule measuring 4.1cm in size which has been biopsied and pathology indicating the nodule is non-cancerous. ENT notes that this is not likely contributing to her cough.   Past Medical History:  Diagnosis Date  . Anxiety   . Arthritis   . Diverticula, colon 1999  . Heart murmur   . History of blood transfusion   . Hyperlipidemia   . Hypertension   . Osteoarthritis of knee    right     Family History  Problem Relation Age of Onset  . Arthritis Mother   . Arthritis Father   . Hypertension Other      Social History   Socioeconomic History  . Marital status: Widowed    Spouse name: Not on file  . Number of children: Not on file  . Years of education: Not on file  . Highest education level: Not on file  Occupational History  . Not on file  Tobacco Use  . Smoking status:  Former Research scientist (life sciences)  . Smokeless tobacco: Never Used  Substance and Sexual Activity  . Alcohol use: No    Alcohol/week: 0.0 standard drinks  . Drug use: No  . Sexual activity: Not on file  Other Topics Concern  . Not on file  Social History Narrative   Widowed, lives alone with 2 dogs   Social Determinants of Health   Financial Resource Strain:   . Difficulty of Paying Living Expenses: Not on file  Food Insecurity:   . Worried About Charity fundraiser in the Last Year: Not on file  . Ran Out of Food in the Last Year: Not on file  Transportation Needs:   . Lack of Transportation (Medical): Not on file  . Lack of Transportation (Non-Medical): Not on file  Physical Activity:   . Days of Exercise per Week: Not on file  . Minutes of Exercise per Session: Not on file  Stress:   . Feeling of Stress : Not on file  Social Connections:   . Frequency of Communication with Friends and Family: Not on file  . Frequency of Social Gatherings with Friends and Family: Not on file  . Attends Religious Services: Not on file  . Active Member of Clubs or Organizations: Not on file  . Attends Archivist Meetings: Not on file  . Marital Status: Not on  file  Intimate Partner Violence:   . Fear of Current or Ex-Partner: Not on file  . Emotionally Abused: Not on file  . Physically Abused: Not on file  . Sexually Abused: Not on file     Allergies  Allergen Reactions  . Lisinopril     Tongue swelling     Outpatient Medications Prior to Visit  Medication Sig Dispense Refill  . acetaminophen (TYLENOL) 500 MG tablet Take 500 mg by mouth every 6 (six) hours as needed for moderate pain or headache.    . ALPRAZolam (XANAX) 0.5 MG tablet Take 1 tablet (0.5 mg total) at bedtime as needed by mouth for anxiety or sleep. 30 tablet 1  . amLODipine (NORVASC) 10 MG tablet Take 1 tablet (10 mg total) daily by mouth. 90 tablet 1  . atorvastatin (LIPITOR) 20 MG tablet Take 1 tablet (20 mg total) daily at  6 PM by mouth. 90 tablet 1  . hydrochlorothiazide (HYDRODIURIL) 25 MG tablet Take 1 tablet (25 mg total) daily by mouth. 90 tablet 1  . potassium chloride (MICRO-K) 10 MEQ CR capsule Take 1 capsule (10 mEq total) 2 (two) times daily by mouth. (Patient not taking: Reported on 07/03/2019) 180 capsule 1  . cefUROXime (CEFTIN) 500 MG tablet Take 500 mg by mouth 2 (two) times daily with a meal.  (Patient not taking: Reported on 01/14/2020)    . omeprazole (PRILOSEC) 40 MG capsule SMARTSIG:1 Capsule(s) By Mouth Every Evening     No facility-administered medications prior to visit.    Review of Systems  Constitutional: Negative for chills, diaphoresis, fever, malaise/fatigue and weight loss.  HENT: Negative for congestion, nosebleeds, sinus pain and sore throat.   Eyes: Negative for blurred vision and redness.  Respiratory: Positive for cough. Negative for hemoptysis, sputum production, shortness of breath and wheezing.   Cardiovascular: Negative for chest pain, palpitations, orthopnea, claudication, leg swelling and PND.  Gastrointestinal: Positive for heartburn and nausea. Negative for abdominal pain, blood in stool, diarrhea and vomiting.  Genitourinary: Negative for dysuria and hematuria.  Musculoskeletal: Negative for joint pain and myalgias.  Skin: Negative for itching and rash.  Neurological: Negative for dizziness, weakness and headaches.  Endo/Heme/Allergies: Does not bruise/bleed easily.  Psychiatric/Behavioral: The patient is nervous/anxious.     Objective:   Vitals:   01/14/20 1406  BP: 138/76  Pulse: (!) 108  Temp: 98.5 F (36.9 C)  TempSrc: Oral  SpO2: 97%  Weight: 169 lb 9.6 oz (76.9 kg)  Height: 5\' 2"  (1.575 m)     Physical Exam Constitutional:      General: She is not in acute distress.    Appearance: Normal appearance. She is obese. She is not ill-appearing.     Comments: Coughing during appointment  HENT:     Head: Normocephalic and atraumatic.     Nose: Nose  normal.     Mouth/Throat:     Mouth: Mucous membranes are moist.     Pharynx: Oropharynx is clear.  Eyes:     General: No scleral icterus.    Extraocular Movements: Extraocular movements intact.     Conjunctiva/sclera: Conjunctivae normal.     Pupils: Pupils are equal, round, and reactive to light.  Cardiovascular:     Rate and Rhythm: Regular rhythm. Tachycardia present.     Pulses: Normal pulses.     Heart sounds: Normal heart sounds.  Pulmonary:     Effort: Pulmonary effort is normal.     Breath sounds: Normal breath sounds.  Abdominal:  General: Bowel sounds are normal. There is no distension.     Palpations: Abdomen is soft.     Tenderness: There is no abdominal tenderness.  Musculoskeletal:     Cervical back: Neck supple.     Right lower leg: No edema.     Left lower leg: No edema.  Lymphadenopathy:     Cervical: No cervical adenopathy.  Skin:    General: Skin is warm and dry.  Neurological:     General: No focal deficit present.     Mental Status: She is alert and oriented to person, place, and time. Mental status is at baseline.  Psychiatric:        Mood and Affect: Mood normal.        Behavior: Behavior normal.        Thought Content: Thought content normal.        Judgment: Judgment normal.    CBC    Component Value Date/Time   WBC 6.0 07/03/2019 1430   RBC 4.10 07/03/2019 1430   HGB 11.0 (L) 07/03/2019 1430   HCT 34.1 (L) 07/03/2019 1430   PLT 305 07/03/2019 1430   MCV 83.2 07/03/2019 1430   MCH 26.8 07/03/2019 1430   MCHC 32.3 07/03/2019 1430   RDW 13.7 07/03/2019 1430   LYMPHSABS 1.0 07/03/2019 1430   MONOABS 0.6 07/03/2019 1430   EOSABS 0.1 07/03/2019 1430   BASOSABS 0.0 07/03/2019 1430   BMP Latest Ref Rng & Units 07/03/2019 07/01/2016 02/17/2016  Glucose 70 - 99 mg/dL 128(H) 124(H) 116(H)  BUN 8 - 23 mg/dL 8 19 15   Creatinine 0.44 - 1.00 mg/dL 0.55 0.78 0.60  Sodium 135 - 145 mmol/L 142 138 139  Potassium 3.5 - 5.1 mmol/L 3.3(L) 3.4(L)  3.3(L)  Chloride 98 - 111 mmol/L 102 99 105  CO2 22 - 32 mmol/L 27 29 23   Calcium 8.9 - 10.3 mg/dL 9.3 10.2 9.6    Chest imaging: 07/03/19 CTA Chest Cardiovascular: Heart is normal size. Scattered coronary artery and aortic calcifications. No evidence of aortic aneurysm or dissection. No filling defects in the pulmonary arteries to suggest pulmonary emboli.  Mediastinum/Nodes: No mediastinal, hilar, or axillary adenopathy. Trachea and esophagus are unremarkable. 3.7 cm predominantly cystic mass in the right thyroid lobe.  Lungs/Pleura: No confluent opacities or effusions. Scarring in the anterior right upper lobe.  Upper Abdomen: Imaging into the upper abdomen shows no acute findings.  Musculoskeletal: Chest wall soft tissues are unremarkable. No acute bony abnormality.  PFT: No PFT on file  Labs: Reviewed as above    Assessment & Plan:   Chronic cough - Plan: ipratropium (ATROVENT) 0.03 % nasal spray, omeprazole (PRILOSEC) 40 MG capsule, benzonatate (TESSALON) 200 MG capsule  Discussion: Cianni Manny is a 74 year old woman, former smoker with history of allergies and GERD who is referred to pulmonary clinic for cough.   Her cough is likely related to post-nasal drip and GERD. She does not appear to have symptoms of reactive airways disease at this time. She does not appear to have symptoms of overt dysphagia or aspiration.   She is to continue on omeprazole therapy and I have renewed her prescription. She is to also elevated the head of her bed with blocks to reduce reflux while she is supine. She is to start ipratropium nasal spray twice daily for 4-5 days, then she can try to reduce the use to bedtime only if possible. We will prescribe tesalon perles for the cough.  If she does  not improve with these initial therapies, we can consider obtaining pulmonary function tests and inhaler therapy.  Patient is to follow up in 3-4 months.  Freda Jackson, MD [place]  [facility] Office: [phone]    Current Outpatient Medications:  .  acetaminophen (TYLENOL) 500 MG tablet, Take 500 mg by mouth every 6 (six) hours as needed for moderate pain or headache., Disp: , Rfl:  .  ALPRAZolam (XANAX) 0.5 MG tablet, Take 1 tablet (0.5 mg total) at bedtime as needed by mouth for anxiety or sleep., Disp: 30 tablet, Rfl: 1 .  amLODipine (NORVASC) 10 MG tablet, Take 1 tablet (10 mg total) daily by mouth., Disp: 90 tablet, Rfl: 1 .  atorvastatin (LIPITOR) 20 MG tablet, Take 1 tablet (20 mg total) daily at 6 PM by mouth., Disp: 90 tablet, Rfl: 1 .  benzonatate (TESSALON) 200 MG capsule, Take 1 capsule (200 mg total) by mouth 3 (three) times daily as needed for cough., Disp: 30 capsule, Rfl: 1 .  hydrochlorothiazide (HYDRODIURIL) 25 MG tablet, Take 1 tablet (25 mg total) daily by mouth., Disp: 90 tablet, Rfl: 1 .  ipratropium (ATROVENT) 0.03 % nasal spray, Place 2 sprays into both nostrils every 12 (twelve) hours., Disp: 30 mL, Rfl: 12 .  omeprazole (PRILOSEC) 40 MG capsule, SMARTSIG:1 Capsule(s) By Mouth Every Evening, Disp: 30 capsule, Rfl: 6 .  potassium chloride (MICRO-K) 10 MEQ CR capsule, Take 1 capsule (10 mEq total) 2 (two) times daily by mouth. (Patient not taking: Reported on 07/03/2019), Disp: 180 capsule, Rfl: 1

## 2020-01-28 DIAGNOSIS — M25561 Pain in right knee: Secondary | ICD-10-CM | POA: Diagnosis not present

## 2020-01-28 DIAGNOSIS — M25562 Pain in left knee: Secondary | ICD-10-CM | POA: Diagnosis not present

## 2020-03-08 ENCOUNTER — Other Ambulatory Visit: Payer: Self-pay | Admitting: Pulmonary Disease

## 2020-03-08 DIAGNOSIS — R053 Chronic cough: Secondary | ICD-10-CM

## 2020-03-10 DIAGNOSIS — R7309 Other abnormal glucose: Secondary | ICD-10-CM | POA: Diagnosis not present

## 2020-03-10 DIAGNOSIS — I10 Essential (primary) hypertension: Secondary | ICD-10-CM | POA: Diagnosis not present

## 2020-03-10 DIAGNOSIS — E785 Hyperlipidemia, unspecified: Secondary | ICD-10-CM | POA: Diagnosis not present

## 2020-03-10 DIAGNOSIS — G4700 Insomnia, unspecified: Secondary | ICD-10-CM | POA: Diagnosis not present

## 2020-03-10 DIAGNOSIS — R059 Cough, unspecified: Secondary | ICD-10-CM | POA: Diagnosis not present

## 2020-03-10 DIAGNOSIS — Z23 Encounter for immunization: Secondary | ICD-10-CM | POA: Diagnosis not present

## 2020-03-10 NOTE — Telephone Encounter (Signed)
Pt is requesting refill on Benzonatate 200 MG Oral Capsule next 04/29/20

## 2020-04-29 ENCOUNTER — Encounter: Payer: Self-pay | Admitting: Pulmonary Disease

## 2020-04-29 ENCOUNTER — Ambulatory Visit (INDEPENDENT_AMBULATORY_CARE_PROVIDER_SITE_OTHER): Payer: Medicare HMO | Admitting: Pulmonary Disease

## 2020-04-29 ENCOUNTER — Other Ambulatory Visit: Payer: Self-pay

## 2020-04-29 VITALS — BP 124/84 | HR 95 | Temp 97.3°F | Ht 62.0 in | Wt 168.0 lb

## 2020-04-29 DIAGNOSIS — K219 Gastro-esophageal reflux disease without esophagitis: Secondary | ICD-10-CM

## 2020-04-29 DIAGNOSIS — R053 Chronic cough: Secondary | ICD-10-CM | POA: Diagnosis not present

## 2020-04-29 MED ORDER — LORATADINE 10 MG PO TABS: 10.0000 mg | ORAL_TABLET | Freq: Every day | ORAL | 11 refills | Status: DC

## 2020-04-29 MED ORDER — BENZONATATE 200 MG PO CAPS: 200.0000 mg | ORAL_CAPSULE | Freq: Three times a day (TID) | ORAL | 0 refills | Status: DC | PRN

## 2020-04-29 MED ORDER — ALBUTEROL SULFATE HFA 108 (90 BASE) MCG/ACT IN AERS: 2.0000 | INHALATION_SPRAY | Freq: Four times a day (QID) | RESPIRATORY_TRACT | 6 refills | Status: DC | PRN

## 2020-04-29 NOTE — Patient Instructions (Addendum)
Use albuterol 1-2 puffs as needed every 4-6 hours for cough  Start claritin 10mg  daily for runny nose   Elevated the head of your bed 4-6 inches with blocks

## 2020-04-29 NOTE — Progress Notes (Signed)
Synopsis: Return visit for chronic cough  Subjective:   PATIENT ID: Donna Lawson GENDER: female DOB: 1946/01/22, MRN: [id number]   HPI  Chief Complaint  Patient presents with  . Follow-up    4 month f/u for cough. States the wedge did help her cough but she hasn't been able to sleep well with it. Also noticed that spicy foods are making her cough worse.    Donna Lawson is a 75 year old woman, former smoker with history of allergies and GERD who returns to pulmonary clinic for cough.   She reports the cough has slightly improved since she started using a wedge pillow a couple weeks ago. She initially tried to elevated the head of her bed with blocks, but apparently is was too much stress on her bed frame. She reports she had reduced sinus drainage with trying the ipratropium nasal spray but recently developed epistaxis again. She does note that her cough is worse when her reflux is acting up and also when she is feeling anxious/stressed out. She reports having a lot of people over for the holidays which gave her a lot of anxiety and she noticed increasing cough, but was also indulging in lots of holiday foods.   01/14/20 Office Visit: She has had a cough for 10 months that is dry. She sometimes feels she has chest congestion that is difficult to cough up. She reports having increasing trouble to seasonal allergies and has been started on zyrtec without much relief. She was also started on flonase but then developed epistaxis. She does complain of post nasal drip. She was evaluated by ENT on 11/19/19 which exam showed mild arytenoid edema and normal vocal cord mobility. There was concern for laryngopharyngeal reflux and she was initiated on omeprazole therapy which has improved her cough somewhat. The cough is worse when lying down and after eating dinner. She denies increased cough while eating or drinking. She denies wheezing or shortness of breath.   She has a thyroid nodule measuring  4.1cm in size which has been biopsied and pathology indicating the nodule is non-cancerous. ENT notes that this is not likely contributing to her cough.   Past Medical History:  Diagnosis Date  . Anxiety   . Arthritis   . Diverticula, colon 1999  . Heart murmur   . History of blood transfusion   . Hyperlipidemia   . Hypertension   . Osteoarthritis of knee    right     Family History  Problem Relation Age of Onset  . Arthritis Mother   . Arthritis Father   . Hypertension Other      Social History   Socioeconomic History  . Marital status: Widowed    Spouse name: Not on file  . Number of children: Not on file  . Years of education: Not on file  . Highest education level: Not on file  Occupational History  . Not on file  Tobacco Use  . Smoking status: Former Research scientist (life sciences)  . Smokeless tobacco: Never Used  Substance and Sexual Activity  . Alcohol use: No    Alcohol/week: 0.0 standard drinks  . Drug use: No  . Sexual activity: Not on file  Other Topics Concern  . Not on file  Social History Narrative   Widowed, lives alone with 2 dogs   Social Determinants of Health   Financial Resource Strain: Not on file  Food Insecurity: Not on file  Transportation Needs: Not on file  Physical Activity: Not on file  Stress: Not on file  Social Connections: Not on file  Intimate Partner Violence: Not on file     Allergies  Allergen Reactions  . Lisinopril     Tongue swelling     Outpatient Medications Prior to Visit  Medication Sig Dispense Refill  . acetaminophen (TYLENOL) 500 MG tablet Take 500 mg by mouth every 6 (six) hours as needed for moderate pain or headache.    . ALPRAZolam (XANAX) 0.5 MG tablet Take 1 tablet (0.5 mg total) at bedtime as needed by mouth for anxiety or sleep. 30 tablet 1  . amLODipine (NORVASC) 10 MG tablet Take 1 tablet (10 mg total) daily by mouth. 90 tablet 1  . atorvastatin (LIPITOR) 20 MG tablet Take 1 tablet (20 mg total) daily at 6 PM by  mouth. 90 tablet 1  . hydrochlorothiazide (HYDRODIURIL) 25 MG tablet Take 1 tablet (25 mg total) daily by mouth. 90 tablet 1  . ipratropium (ATROVENT) 0.03 % nasal spray Place 2 sprays into both nostrils every 12 (twelve) hours. 30 mL 12  . potassium chloride (MICRO-K) 10 MEQ CR capsule Take 1 capsule (10 mEq total) 2 (two) times daily by mouth. 180 capsule 1  . benzonatate (TESSALON) 200 MG capsule Take 1 capsule by mouth three times daily as needed for cough 30 capsule 0  . omeprazole (PRILOSEC) 40 MG capsule SMARTSIG:1 Capsule(s) By Mouth Every Evening 30 capsule 6  . pantoprazole (PROTONIX) 40 MG tablet Take 40 mg by mouth daily.     No facility-administered medications prior to visit.    Review of Systems  Constitutional: Negative for chills, fever, malaise/fatigue and weight loss.  HENT: Negative for congestion, ear pain, sinus pain and sore throat.   Eyes: Negative.   Respiratory: Positive for cough. Negative for hemoptysis, sputum production, shortness of breath and wheezing.   Cardiovascular: Negative for chest pain, palpitations, orthopnea, claudication, leg swelling and PND.  Gastrointestinal: Positive for heartburn. Negative for abdominal pain, diarrhea, nausea and vomiting.  Genitourinary: Negative.   Musculoskeletal: Negative.   Skin: Negative for rash.  Neurological: Negative.   Endo/Heme/Allergies: Negative.   Psychiatric/Behavioral: The patient is nervous/anxious.    Objective:   Vitals:   04/29/20 1128  BP: 124/84  Pulse: 95  Temp: (!) 97.3 F (36.3 C)  TempSrc: Temporal  SpO2: 98%  Weight: 168 lb (76.2 kg)  Height: 5\' 2"  (1.575 m)     Physical Exam Constitutional:      General: She is not in acute distress.    Appearance: Normal appearance. She is not ill-appearing.  HENT:     Head: Normocephalic and atraumatic.     Mouth/Throat:     Mouth: Mucous membranes are moist.     Pharynx: Oropharynx is clear.  Eyes:     General: No scleral icterus.     Conjunctiva/sclera: Conjunctivae normal.     Pupils: Pupils are equal, round, and reactive to light.  Cardiovascular:     Rate and Rhythm: Normal rate and regular rhythm.     Pulses: Normal pulses.     Heart sounds: Normal heart sounds. No murmur heard.   Pulmonary:     Effort: Pulmonary effort is normal. No respiratory distress.     Breath sounds: No wheezing, rhonchi or rales.  Abdominal:     General: Bowel sounds are normal.     Palpations: Abdomen is soft.  Musculoskeletal:     Right lower leg: No edema.     Left lower leg: No edema.  Skin:    General: Skin is warm and dry.  Neurological:     General: No focal deficit present.     Mental Status: She is alert.  Psychiatric:        Mood and Affect: Mood normal.        Behavior: Behavior normal.        Thought Content: Thought content normal.        Judgment: Judgment normal.    CBC    Component Value Date/Time   WBC 6.0 07/03/2019 1430   RBC 4.10 07/03/2019 1430   HGB 11.0 (L) 07/03/2019 1430   HCT 34.1 (L) 07/03/2019 1430   PLT 305 07/03/2019 1430   MCV 83.2 07/03/2019 1430   MCH 26.8 07/03/2019 1430   MCHC 32.3 07/03/2019 1430   RDW 13.7 07/03/2019 1430   LYMPHSABS 1.0 07/03/2019 1430   MONOABS 0.6 07/03/2019 1430   EOSABS 0.1 07/03/2019 1430   BASOSABS 0.0 07/03/2019 1430     Chest imaging: 07/03/19 CTA Chest Cardiovascular: Heart is normal size. Scattered coronary artery and aortic calcifications. No evidence of aortic aneurysm or dissection. No filling defects in the pulmonary arteries to suggest pulmonary emboli.  Mediastinum/Nodes: No mediastinal, hilar, or axillary adenopathy. Trachea and esophagus are unremarkable. 3.7 cm predominantly cystic mass in the right thyroid lobe.  Lungs/Pleura: No confluent opacities or effusions. Scarring in the anterior right upper lobe.  Upper Abdomen: Imaging into the upper abdomen shows no acute findings.  Musculoskeletal: Chest wall soft tissues are  unremarkable. No acute bony abnormality.  PFT: No flowsheet data found.    Assessment & Plan:   Chronic cough - Plan: benzonatate (TESSALON) 200 MG capsule  Gastroesophageal reflux disease without esophagitis  Discussion: Donna Lawson is a 75 year old woman, former smoker with history of allergies and GERD who returns to pulmonary clinic for cough.  Her cough is mostly related to chronic reflux disease. She is to continue on pantoprazole 40mg  a day and monitor her diet to prevent exacerbating her reflux. She is also to continue sleeping with her head elevated at night. She does have a sinus drainage component but develops epistaxis from nasal sprays so she is to take loratidine on a daily basis. We will also provide her with an albuterol inhaler to try and see if it helps with her symptoms.    Follow up in 3 months  Freda Jackson, MD [place] [facility] Office: [phone]   See Amion for Pager Details    Current Outpatient Medications:  .  acetaminophen (TYLENOL) 500 MG tablet, Take 500 mg by mouth every 6 (six) hours as needed for moderate pain or headache., Disp: , Rfl:  .  albuterol (VENTOLIN HFA) 108 (90 Base) MCG/ACT inhaler, Inhale 2 puffs into the lungs every 6 (six) hours as needed for wheezing or shortness of breath., Disp: 8 g, Rfl: 6 .  ALPRAZolam (XANAX) 0.5 MG tablet, Take 1 tablet (0.5 mg total) at bedtime as needed by mouth for anxiety or sleep., Disp: 30 tablet, Rfl: 1 .  amLODipine (NORVASC) 10 MG tablet, Take 1 tablet (10 mg total) daily by mouth., Disp: 90 tablet, Rfl: 1 .  atorvastatin (LIPITOR) 20 MG tablet, Take 1 tablet (20 mg total) daily at 6 PM by mouth., Disp: 90 tablet, Rfl: 1 .  hydrochlorothiazide (HYDRODIURIL) 25 MG tablet, Take 1 tablet (25 mg total) daily by mouth., Disp: 90 tablet, Rfl: 1 .  ipratropium (ATROVENT) 0.03 % nasal spray, Place 2 sprays  into both nostrils every 12 (twelve) hours., Disp: 30 mL, Rfl: 12 .  loratadine  (CLARITIN) 10 MG tablet, Take 1 tablet (10 mg total) by mouth daily., Disp: 30 tablet, Rfl: 11 .  potassium chloride (MICRO-K) 10 MEQ CR capsule, Take 1 capsule (10 mEq total) 2 (two) times daily by mouth., Disp: 180 capsule, Rfl: 1 .  benzonatate (TESSALON) 200 MG capsule, Take 1 capsule (200 mg total) by mouth 3 (three) times daily as needed for cough., Disp: 30 capsule, Rfl: 0 .  pantoprazole (PROTONIX) 40 MG tablet, Take 40 mg by mouth daily., Disp: , Rfl:

## 2020-06-11 ENCOUNTER — Encounter (HOSPITAL_COMMUNITY): Payer: Self-pay | Admitting: Emergency Medicine

## 2020-06-11 ENCOUNTER — Other Ambulatory Visit: Payer: Self-pay

## 2020-06-11 ENCOUNTER — Emergency Department (HOSPITAL_COMMUNITY): Payer: Medicare HMO

## 2020-06-11 ENCOUNTER — Emergency Department (HOSPITAL_COMMUNITY)
Admission: EM | Admit: 2020-06-11 | Discharge: 2020-06-11 | Disposition: A | Discharge status: Home / Self Care | Payer: Medicare HMO | Attending: Emergency Medicine | Admitting: Emergency Medicine

## 2020-06-11 DIAGNOSIS — D649 Anemia, unspecified: Secondary | ICD-10-CM | POA: Diagnosis not present

## 2020-06-11 DIAGNOSIS — R1011 Right upper quadrant pain: Secondary | ICD-10-CM | POA: Diagnosis present

## 2020-06-11 DIAGNOSIS — E876 Hypokalemia: Secondary | ICD-10-CM

## 2020-06-11 DIAGNOSIS — K529 Noninfective gastroenteritis and colitis, unspecified: Secondary | ICD-10-CM | POA: Diagnosis not present

## 2020-06-11 DIAGNOSIS — R000 Tachycardia, unspecified: Secondary | ICD-10-CM | POA: Diagnosis not present

## 2020-06-11 DIAGNOSIS — Z8719 Personal history of other diseases of the digestive system: Secondary | ICD-10-CM | POA: Insufficient documentation

## 2020-06-11 DIAGNOSIS — Z87891 Personal history of nicotine dependence: Secondary | ICD-10-CM | POA: Insufficient documentation

## 2020-06-11 DIAGNOSIS — D509 Iron deficiency anemia, unspecified: Secondary | ICD-10-CM

## 2020-06-11 DIAGNOSIS — I10 Essential (primary) hypertension: Secondary | ICD-10-CM | POA: Insufficient documentation

## 2020-06-11 DIAGNOSIS — N179 Acute kidney failure, unspecified: Secondary | ICD-10-CM | POA: Diagnosis not present

## 2020-06-11 DIAGNOSIS — K8020 Calculus of gallbladder without cholecystitis without obstruction: Secondary | ICD-10-CM

## 2020-06-11 DIAGNOSIS — K5730 Diverticulosis of large intestine without perforation or abscess without bleeding: Secondary | ICD-10-CM | POA: Diagnosis not present

## 2020-06-11 DIAGNOSIS — K8080 Other cholelithiasis without obstruction: Secondary | ICD-10-CM | POA: Diagnosis not present

## 2020-06-11 DIAGNOSIS — Z79899 Other long term (current) drug therapy: Secondary | ICD-10-CM | POA: Insufficient documentation

## 2020-06-11 LAB — COMPREHENSIVE METABOLIC PANEL
ALT: 9 U/L (ref 0–44)
AST: 12 U/L — ABNORMAL LOW (ref 15–41)
Albumin: 3.4 g/dL — ABNORMAL LOW (ref 3.5–5.0)
Alkaline Phosphatase: 100 U/L (ref 38–126)
Anion gap: 14 (ref 5–15)
BUN: 11 mg/dL (ref 8–23)
CO2: 25 mmol/L (ref 22–32)
Calcium: 9.6 mg/dL (ref 8.9–10.3)
Chloride: 96 mmol/L — ABNORMAL LOW (ref 98–111)
Creatinine, Ser: 1.19 mg/dL — ABNORMAL HIGH (ref 0.44–1.00)
GFR, Estimated: 47 mL/min — ABNORMAL LOW (ref >60)
Glucose, Bld: 117 mg/dL — ABNORMAL HIGH (ref 70–99)
Potassium: 2.5 mmol/L — CL (ref 3.5–5.1)
Sodium: 135 mmol/L (ref 135–145)
Total Bilirubin: 0.7 mg/dL (ref 0.3–1.2)
Total Protein: 7.3 g/dL (ref 6.5–8.1)

## 2020-06-11 LAB — URINALYSIS, ROUTINE W REFLEX MICROSCOPIC
Bilirubin Urine: NEGATIVE
Glucose, UA: NEGATIVE mg/dL
Hgb urine dipstick: NEGATIVE
Ketones, ur: NEGATIVE mg/dL
Leukocytes,Ua: NEGATIVE
Nitrite: NEGATIVE
Protein, ur: NEGATIVE mg/dL
Specific Gravity, Urine: 1.002 — ABNORMAL LOW (ref 1.005–1.030)
pH: 6 (ref 5.0–8.0)

## 2020-06-11 LAB — CBC
HCT: 35.5 % — ABNORMAL LOW (ref 36.0–46.0)
Hemoglobin: 11.2 g/dL — ABNORMAL LOW (ref 12.0–15.0)
MCH: 23.2 pg — ABNORMAL LOW (ref 26.0–34.0)
MCHC: 31.5 g/dL (ref 30.0–36.0)
MCV: 73.5 fL — ABNORMAL LOW (ref 80.0–100.0)
Platelets: 363 10*3/uL (ref 150–400)
RBC: 4.83 MIL/uL (ref 3.87–5.11)
RDW: 15.9 % — ABNORMAL HIGH (ref 11.5–15.5)
WBC: 8.8 10*3/uL (ref 4.0–10.5)
nRBC: 0 % (ref 0.0–0.2)

## 2020-06-11 LAB — MAGNESIUM: Magnesium: 2.2 mg/dL (ref 1.7–2.4)

## 2020-06-11 LAB — LIPASE, BLOOD: Lipase: 28 U/L (ref 11–51)

## 2020-06-11 MED ORDER — POTASSIUM CHLORIDE 10 MEQ/100ML IV SOLN
10.0000 meq | INTRAVENOUS | Status: AC
  Administered 2020-06-11 (×2): 10 meq via INTRAVENOUS
  Filled 2020-06-11 (×2): qty 100

## 2020-06-11 MED ORDER — POTASSIUM CHLORIDE CRYS ER 20 MEQ PO TBCR
40.0000 meq | EXTENDED_RELEASE_TABLET | Freq: Once | ORAL | Status: AC
  Administered 2020-06-11: 40 meq via ORAL
  Filled 2020-06-11: qty 2

## 2020-06-11 MED ORDER — IOHEXOL 350 MG/ML SOLN
80.0000 mL | Freq: Once | INTRAVENOUS | Status: AC | PRN
  Administered 2020-06-11: 80 mL via INTRAVENOUS

## 2020-06-11 NOTE — Discharge Instructions (Addendum)
Ms. Saunder,   Your abdominal imaging shows that you do have gallstones although no signs of infection of your gallbladder. The treatment for this is elective surgical removal of the gallbladder. Please call Reather Laurence with general surgery within the next week or so to schedule a follow up appointment.   You were also found to have an irregular heart rhythm in the ED which may represent new-onset atrial fibrillation as well as low potassium levels. I have sent in a referral to the [facility]. You should expect a call to schedule an appointment for this in the near future; however, we will hold off on starting anticoagulation at this time.   If you experience fevers, chills, vomiting, unremitting abdominal pain, chest pain, palpitations, or feel as though you will pass out, please return to the ED for evaluation sooner. Otherwise, I have included contact information for our internal medicine clinic here on the ground floor of the hospital. Phone number: [phone]. Please call to schedule an appointment within the next 1-2 weeks for hospital follow up and inform them you will need your potassium rechecked and let your current provider know if you intend to switch primary care providers.  Thank you and take care,  Dr. Konrad Penta

## 2020-06-11 NOTE — ED Triage Notes (Signed)
Patient states she has been having a "gallbladder attack" for two weeks. States she was told 20 years ago it needed to be removed but has not done so. Patient alert, oriented, and in no apparent distress at this time.

## 2020-06-11 NOTE — ED Provider Notes (Signed)
[facility] Provider Note   CSN: [id number] Arrival date & time: 06/11/20  1551   Chief Complaint: Abdominal Pain  History Chief Complaint  Patient presents with  . Abdominal Pain    Donna Lawson is a 75 y.o. female.  HPI   Donna Lawson is a 75 y.o. lady w/ PMHx gallstones, colonic perforation s/p resection, thyroid nodule, HTN, HLD, anxiety, knee OA, presenting to the ED due to persistent abdominal pain. She states she first noticed pain in her RUQ 20 years ago and had an ultrasound, was diagnosed with gallstones, and was told she'd need her gallbladder removed although never followed up. Notes intermittent pain in her RUQ since that time that has become more persistent over the past 2 weeks. She describes the pain as a nagging pain associated with mild bloating of her abdomen, gas pains, decreased appetite, fatigue, nausea over the past couple of days (which she attributes to eating only broth and crackers the past two weeks), and increase in stool frequency with diarrhea the past week and dark stools only when she takes Pepto Bismol. Notes that her pain seems to improve after eating although has been avoiding eating fatty foods which have triggered her pain in the past. Notes she gets diffuse lower abdominal pain about 2 hours after eating and epigastric pain that she attributes to GERD that improves when she sits up. She avoids laying down at night due to this. Notes worsening dry cough over the past year which she attributes to amlodipine although this also tends to be worse when laying flat as well.   Past Medical History:  Diagnosis Date  . Anxiety   . Arthritis   . Diverticula, colon 1999  . Heart murmur   . History of blood transfusion   . Hyperlipidemia   . Hypertension   . Osteoarthritis of knee    right    Patient Active Problem List   Diagnosis Date Noted  . Dysuria 04/06/2017  . Anxiety 03/29/2013  . Hypertension   .  Hyperlipidemia   . Osteoarthritis of knee   . Obese     Past Surgical History:  Procedure Laterality Date  . ABDOMINAL SURGERY    . COLONOSCOPY    . NO PAST SURGERIES       OB History   No obstetric history on file.     Family History  Problem Relation Age of Onset  . Arthritis Mother   . Arthritis Father   . Hypertension Other     Social History   Tobacco Use  . Smoking status: Former Research scientist (life sciences)  . Smokeless tobacco: Never Used  Substance Use Topics  . Alcohol use: No    Alcohol/week: 0.0 standard drinks  . Drug use: No    Home Medications Prior to Admission medications   Medication Sig Start Date End Date Taking? Authorizing Provider  acetaminophen (TYLENOL) 500 MG tablet Take 500 mg by mouth every 6 (six) hours as needed for moderate pain or headache.    [provider]  albuterol (VENTOLIN HFA) 108 (90 Base) MCG/ACT inhaler Inhale 2 puffs into the lungs every 6 (six) hours as needed for wheezing or shortness of breath. 04/29/20   Freddi Starr, MD  ALPRAZolam Duanne Moron) 0.5 MG tablet Take 1 tablet (0.5 mg total) at bedtime as needed by mouth for anxiety or sleep. 02/25/17   Nche, Charlene Brooke, NP  amLODipine (NORVASC) 10 MG tablet Take 1 tablet (10 mg total) daily by mouth.  02/25/17   Nche, Charlene Brooke, NP  atorvastatin (LIPITOR) 20 MG tablet Take 1 tablet (20 mg total) daily at 6 PM by mouth. 02/25/17   Nche, Charlene Brooke, NP  benzonatate (TESSALON) 200 MG capsule Take 1 capsule (200 mg total) by mouth 3 (three) times daily as needed for cough. 04/29/20   Freddi Starr, MD  hydrochlorothiazide (HYDRODIURIL) 25 MG tablet Take 1 tablet (25 mg total) daily by mouth. 02/25/17   Nche, Charlene Brooke, NP  ipratropium (ATROVENT) 0.03 % nasal spray Place 2 sprays into both nostrils every 12 (twelve) hours. 01/14/20   Freddi Starr, MD  loratadine (CLARITIN) 10 MG tablet Take 1 tablet (10 mg total) by mouth daily. 04/29/20   Freddi Starr, MD  pantoprazole  (PROTONIX) 40 MG tablet Take 40 mg by mouth daily. 03/10/20   [provider]  potassium chloride (MICRO-K) 10 MEQ CR capsule Take 1 capsule (10 mEq total) 2 (two) times daily by mouth. 02/25/17   Nche, Charlene Brooke, NP  lisinopril-hydrochlorothiazide (PRINZIDE,ZESTORETIC) 20-25 MG per tablet Take 1 tablet by mouth 2 (two) times daily. Take 1 by mouth daily 03/17/12 05/10/12  [provider]    Allergies    Lisinopril  Review of Systems   Review of Systems   10-point review of systems otherwise negative except as noted above in HPI.   Physical Exam Updated Vital Signs BP (!) 166/102   Pulse 89   Temp 98.8 F (37.1 C) (Oral)   Resp 11   SpO2 94%   Physical Exam   General: Patient appears well. No acute distress. Eyes: Sclera non-icteric. No conjunctival injection.  HENT: Moist mucus membranes. No nasal discharge. Respiratory: Lungs are CTA, bilaterally. No wheezes, rales, or rhonchi. Patient has active cough on examination although no tachypnea or increased work of breathing on room air.  Cardiovascular: Rate is slightly tachycardic. Rhythm is irregularly irregular. Unable to appreciate any cardiac murmurs, rubs, or gallops. No lower extremity edema.  Abdominal: Soft, non-distended, and not tender to palpation. Bowel sounds intact. No rebound or guarding. Negative Murphy's sign.  Neurological: Alert and oriented x 3. Negative straight leg raise.  Musculoskeletal: There is mild right-sided paraspinal muscle tenderness to palpation without swelling. No spinal or joint tenderness to palpation. Able to move bilateral lower extremities equally. 5/5 strength in all four extremities.  Skin: No lesions. No rashes.  Psych: Patient appears anxious. Pleasant and cooperative.   ED Results / Procedures / Treatments   Labs (all labs ordered are listed, but only abnormal results are displayed) Labs Reviewed  COMPREHENSIVE METABOLIC PANEL - Abnormal; Notable for the following  components:      Result Value   Potassium 2.5 (*)    Chloride 96 (*)    Glucose, Bld 117 (*)    Creatinine, Ser 1.19 (*)    Albumin 3.4 (*)    AST 12 (*)    GFR, Estimated 47 (*)    All other components within normal limits  CBC - Abnormal; Notable for the following components:   Hemoglobin 11.2 (*)    HCT 35.5 (*)    MCV 73.5 (*)    MCH 23.2 (*)    RDW 15.9 (*)    All other components within normal limits  URINALYSIS, ROUTINE W REFLEX MICROSCOPIC - Abnormal; Notable for the following components:   Color, Urine STRAW (*)    Specific Gravity, Urine 1.002 (*)    All other components within normal limits  LIPASE, BLOOD  MAGNESIUM    EKG EKG Interpretation  Date/Time:  Wednesday June 11 2020 19:24:40 EST Ventricular Rate:  100 PR Interval:    QRS Duration: 94 QT Interval:  271 QTC Calculation: 319 R Axis:   -6 Text Interpretation: Sinus tachycardia Supraventricular bigeminy Borderline repolarization abnormality Confirmed by Madalyn Rob [phone]) on 06/11/2020 7:27:12 PM   Radiology CT Angio Abd/Pel W and/or Wo Contrast  Result Date: 06/11/2020 CLINICAL DATA:  75 year old female with atrial fibrillation. Concern for mesenteric ischemia. EXAM: CTA ABDOMEN AND PELVIS WITHOUT AND WITH CONTRAST TECHNIQUE: Multidetector CT imaging of the abdomen and pelvis was performed using the standard protocol during bolus administration of intravenous contrast. Multiplanar reconstructed images and MIPs were obtained and reviewed to evaluate the vascular anatomy. CONTRAST:  28m OMNIPAQUE IOHEXOL 350 MG/ML SOLN COMPARISON:  None. FINDINGS: VASCULAR Aorta: Moderate calcified and noncalcified plaques. No aneurysmal dilatation or dissection. No periaortic fluid collection. Celiac: Atherosclerotic calcification of the origin of the celiac artery. The celiac artery is patent. SMA: Atherosclerotic calcification of the origin of the SMA. The SMA is patent. There is mild haziness of the fat plane  surrounding the SMA which may be related to chronic inflammation/vasculitis. There is a replaced hepatic artery arising from the SMA. Renals: Atherosclerotic calcification of the origins of the renal arteries. The renal arteries remain patent. IMA: The IMA is patent. Inflow: The iliac arteries are patent. No aneurysmal dilatation or dissection. Proximal Outflow: The visualized portions of the common femoral, deep and superficial femoral arteries are patent. Veins: The IVC is unremarkable. No portal venous gas. There is no adenopathy. Review of the MIP images confirms the above findings. NON-VASCULAR Lower chest: The visualized lung bases are clear. No intra-abdominal free air or free fluid. Hepatobiliary: A 1 cm left hepatic cyst. The liver is otherwise unremarkable. No intrahepatic biliary ductal dilatation. There is a noncalcified stone in the neck of the gallbladder. Possible minimal pericholecystic fluid or edema. Further evaluation with right upper quadrant ultrasound recommended. Pancreas: Unremarkable. No pancreatic ductal dilatation or surrounding inflammatory changes. Spleen: Normal in size without focal abnormality. Adrenals/Urinary Tract: An indeterminate 2.5 cm left adrenal nodule. The right adrenal glands unremarkable. Mild bilateral renal parenchyma atrophy. There is a 1 cm right renal inferior pole cyst as well as additional subcentimeter right renal hypodensities which are too small to characterize. Bilateral extrarenal pelvis with mild bilateral pelvicaliectasis. The visualized ureters and urinary bladder appear unremarkable. Stomach/Bowel: There is scattered colonic diverticula without active inflammatory changes. There is no bowel obstruction. There is mild haziness of the mesentery, nonspecific. Clinical correlation is recommended to evaluate for possibility of mild enteritis. Diffuse thickened appearance of the colon, likely related to underdistention. The appendix is normal. Lymphatic: No  adenopathy. Reproductive: The uterus is suboptimally evaluated. Small scattered calcification in the uterus may be related to underlying fibroids. No adnexal masses. Other: Midline vertical anterior pelvic wall incisional scar. Musculoskeletal: Osteopenia with degenerative changes of the spine. No acute osseous pathology. IMPRESSION: 1. Patent major mesenteric vasculature. No evidence of bowel ischemia. 2. Mild stranding of the mesentery, nonspecific. Correlation with clinical exam is recommended to evaluate for possibility of mild enteritis. 3. Mild haziness of the fat plane surrounding the SMA may be related to chronic inflammation/vasculitis. 4. Cholelithiasis. Further evaluation with right upper quadrant ultrasound recommended to exclude an early acute cholecystitis. 5. Colonic diverticulosis. No bowel obstruction. Normal appendix. 6. An indeterminate 2.5 cm left adrenal nodule. 7. Aortic Atherosclerosis (ICD10-I70.0). Electronically Signed   By: ALaren EvertsD.  On: 06/11/2020 21:06   US Abdomen Limited RUQ (LIVER/GB)  Result Date: 06/11/2020 CLINICAL DATA:  Right upper quadrant pain EXAM: ULTRASOUND ABDOMEN LIMITED RIGHT UPPER QUADRANT COMPARISON:  None FINDINGS: Gallbladder: Small amount of gallbladder sludge. Shadowing stone measuring 1.9 cm. Normal wall thickness. Negative sonographic Murphy. Common bile duct: Diameter: 5 mm Liver: Hepatic echogenicity within normal limits. Small cyst in the left hepatic lobe measuring 1.3 cm. Portal vein is patent on color Doppler imaging with normal direction of blood flow towards the liver. Other: Incidental note made of mild to moderate hydronephrosis right kidney. IMPRESSION: 1. Cholelithiasis and small amount of gallbladder sludge but no other sonographic features to suggest acute cholecystitis. 2. Incidental note made of mild to moderate hydronephrosis of right kidney. If distal obstruction is suspected, CT KUB could be obtained for further evaluation.  Electronically Signed   By: Donavan Foil M.D.   On: 06/11/2020 21:02    Procedures Procedures   Medications Ordered in ED Medications  potassium chloride 10 mEq in 100 mL IVPB (10 mEq Intravenous New Bag/Given 06/11/20 2127)  potassium chloride SA (KLOR-CON) CR tablet 40 mEq (40 mEq Oral Given 06/11/20 1940)  iohexol (OMNIPAQUE) 350 MG/ML injection 80 mL (80 mLs Intravenous Contrast Given 06/11/20 2025)    ED Course  I have reviewed the triage vital signs and the nursing notes.  Pertinent labs & imaging results that were available during my care of the patient were reviewed by me and considered in my medical decision making (see chart for details).    MDM Rules/Calculators/A&P                          Ms. Kamienski is a 75 y.o. lady w/ PMHx CAD, remote partial colonic resection, HTN, HLD, possible gallstones, thyroid nodule, and anxiety, presenting with intermittent RUQ pain, post-prandial periumbilical pain, nausea, diarrhea, and decreased PO intake. Patient noted to have irregularly irregular heart rhythm with blood pressures initially in the low [id number] systolic, although afebrile saturating in the mid-90's on room air. On exam, has no tenderness to palpation with negative Murphy's sign. Above concerning for acute mesenteric ischemia and possible cholelithiasis.   Plan:   - Patient made NPO - Check CBC, CMP, lipase, U/A  - For arrhythmia, check EKG, magnesium - Check RUQ ultrasound and CTA abdomen/pelvis  - Continuous cardiac monitoring   Results:  - Initial EKG shows A-Fib, initially with RVR although rates improved to normal ranges after resting in the ED. Repeat EKG performed which is more consistent with NSR with PAC's rather than A-Fib.  - CBC notable for microcytic anemia, Hgb 11.2, MCV 73.5 although this was also noted in 2021. CMP remarkable for potassium 2.5, elevated creatinine 1.19, GFR 47, albumin 3.4. Creatinine last year was normal; likely represents prerenal AKI in the  setting of poor PO intake recently; however, specific gravity is low. Bilirubin, LFT's, ALP within normal limits making obstructive cholelithiasis or other hepatic cause less likely.   - Giving 70mq PO KCl and 131m IV KCl  - 1L LR bolus   - CTA abdomen/pelvis shows mild stranding of the mesentary with mild haziness of the fat plane surrounding the MSA. Cholelithiasis, diverticulosis, and 2.5cm L adrenal nodule. Negative for acute vascular occlusion.  - RUQ ultrasound confirms cholelithiasis without signs to suggest cholecystitis. Incidental hydronephrosis identified, which was mild on CTA.   Findings are consistent with symptomatic cholelithiasis and possible new-onset A-Fib that may have occurred in setting of  severe hypokalemia on presentation, as repeat EKG seems more consistent with NSR with PAC's. Also noted to have mild hydronephrosis although this is asymptomatic. For patient's cholelithiasis, will provide patient with general surgery's contact information for possible elective outpatient cholecystectomy. Patient may have new-onset A-fib for which I will refer her to the A-Fib clinic. Given repeat EKG more consistent with NSR with PAC's and due to low hemoglobin with hx dark stools (in setting of pepto bismol) will hold off on initiating anticoagulation today. Informed patient she will need to follow up with PCP as well within the next 1-2 weeks for repeat lab work in the setting of severe hypokalemia and for hospital follow up. Patient will be discharged with strict return precautions provided.   Final Clinical Impression(s) / ED Diagnoses Final diagnoses:  RUQ pain  Microcytic anemia  Hypokalemia  AKI (acute kidney injury) ([place])  Symptomatic cholelithiasis    Rx / DC Orders ED Discharge Orders         Ordered    Amb Referral to AFIB Clinic       Comments: Possible new-onset A-Fib in ED.   06/11/20 2134         Jeralyn Bennett, MD 06/11/2020, 9:54 PM Pager: [phone]     Jeralyn Bennett, MD 06/11/20 2154    Lucrezia Starch, MD 06/13/20 2154

## 2020-06-17 ENCOUNTER — Ambulatory Visit (HOSPITAL_COMMUNITY)
Admission: RE | Admit: 2020-06-17 | Discharge: 2020-06-17 | Disposition: A | Discharge status: Home / Self Care | Payer: Medicare HMO | Source: Ambulatory Visit | Attending: Physician Assistant | Admitting: Physician Assistant

## 2020-06-17 ENCOUNTER — Encounter (HOSPITAL_COMMUNITY): Payer: Self-pay | Admitting: Physician Assistant

## 2020-06-17 ENCOUNTER — Other Ambulatory Visit: Payer: Self-pay

## 2020-06-17 VITALS — BP 130/90 | HR 96 | Ht 62.0 in | Wt 159.0 lb

## 2020-06-17 DIAGNOSIS — Z885 Allergy status to narcotic agent status: Secondary | ICD-10-CM | POA: Diagnosis not present

## 2020-06-17 DIAGNOSIS — Z87891 Personal history of nicotine dependence: Secondary | ICD-10-CM | POA: Diagnosis not present

## 2020-06-17 DIAGNOSIS — I10 Essential (primary) hypertension: Secondary | ICD-10-CM | POA: Insufficient documentation

## 2020-06-17 DIAGNOSIS — Z79899 Other long term (current) drug therapy: Secondary | ICD-10-CM | POA: Diagnosis not present

## 2020-06-17 DIAGNOSIS — I4891 Unspecified atrial fibrillation: Secondary | ICD-10-CM | POA: Insufficient documentation

## 2020-06-17 DIAGNOSIS — I491 Atrial premature depolarization: Secondary | ICD-10-CM | POA: Insufficient documentation

## 2020-06-17 DIAGNOSIS — Z8249 Family history of ischemic heart disease and other diseases of the circulatory system: Secondary | ICD-10-CM | POA: Diagnosis not present

## 2020-06-17 DIAGNOSIS — Z888 Allergy status to other drugs, medicaments and biological substances status: Secondary | ICD-10-CM | POA: Diagnosis not present

## 2020-06-17 DIAGNOSIS — E785 Hyperlipidemia, unspecified: Secondary | ICD-10-CM | POA: Diagnosis not present

## 2020-06-17 DIAGNOSIS — R002 Palpitations: Secondary | ICD-10-CM

## 2020-06-17 HISTORY — DX: Atrial premature depolarization: I49.1

## 2020-06-17 NOTE — Progress Notes (Signed)
Primary Care Physician: Donald Prose, MD Primary Cardiologist: none Primary Electrophysiologist: none Referring Physician: [facility]   Donna Lawson is a 75 y.o. female with a history of gallstones, colonic perforation s/p resection, thyroid nodule, HTN, HLD, anxiety who presents for consultation in the [facility]. Patient was seen at ED 06/11/20 with acute abdominal pain, likely symptomatic cholelithiasis. Incidentally, her ECG showed salvos of disorganized atrial activity. Repeat ECG showed SR with PACs. She was not started on anticoagulation at that time. She has a CHADS2VASC score of 4. Her primary concern today is about her gallbladder. She has an appointment tomorrow with a surgeon to discuss treatment options.   Today, she denies symptoms of palpitations, chest pain, shortness of breath, orthopnea, PND, lower extremity edema, dizziness, presyncope, syncope, snoring, daytime somnolence, bleeding, or neurologic sequela. The patient is tolerating medications without difficulties and is otherwise without complaint today.    Atrial Fibrillation Risk Factors:  she does not have symptoms or diagnosis of sleep apnea. she does not have a history of rheumatic fever.   she has a BMI of Body mass index is 29.08 kg/m.Marland Kitchen Filed Weights   06/17/20 1121  Weight: 72.1 kg    Family History  Problem Relation Age of Onset  . Arthritis Mother   . Arthritis Father   . Hypertension Other      Atrial Fibrillation Management history:  Previous antiarrhythmic drugs: none Previous cardioversions: none Previous ablations: none CHADS2VASC score: 4 Anticoagulation history: none   Past Medical History:  Diagnosis Date  . Anxiety   . Arthritis   . Diverticula, colon 1999  . Heart murmur   . History of blood transfusion   . Hyperlipidemia   . Hypertension   . Osteoarthritis of knee    right   Past Surgical History:  Procedure Laterality Date  .  ABDOMINAL SURGERY    . COLONOSCOPY    . NO PAST SURGERIES      Current Outpatient Medications  Medication Sig Dispense Refill  . acetaminophen (TYLENOL) 500 MG tablet Take 500 mg by mouth every 6 (six) hours as needed for moderate pain or headache.    . albuterol (VENTOLIN HFA) 108 (90 Base) MCG/ACT inhaler Inhale 2 puffs into the lungs every 6 (six) hours as needed for wheezing or shortness of breath. 8 g 6  . ALPRAZolam (XANAX) 0.5 MG tablet Take 1 tablet (0.5 mg total) at bedtime as needed by mouth for anxiety or sleep. 30 tablet 1  . amLODipine (NORVASC) 10 MG tablet Take 1 tablet (10 mg total) daily by mouth. 90 tablet 1  . hydrochlorothiazide (HYDRODIURIL) 25 MG tablet Take 1 tablet (25 mg total) daily by mouth. 90 tablet 1  . loratadine (CLARITIN) 10 MG tablet Take 1 tablet (10 mg total) by mouth daily. 30 tablet 11   No current facility-administered medications for this encounter.    Allergies  Allergen Reactions  . Atorvastatin     Other reaction(s): myalgias  . Cefuroxime Axetil     Other reaction(s): not feel well  . Crestor [Rosuvastatin Calcium]     Other reaction(s): myalgias  . Lisinopril     Tongue swelling  . Oxycodone-Acetaminophen     Other reaction(s): feel weird    Social History   Socioeconomic History  . Marital status: Widowed    Spouse name: Not on file  . Number of children: Not on file  . Years of education: Not on file  . Highest education  level: Not on file  Occupational History  . Not on file  Tobacco Use  . Smoking status: Former Research scientist (life sciences)  . Smokeless tobacco: Never Used  Substance and Sexual Activity  . Alcohol use: No    Alcohol/week: 0.0 standard drinks  . Drug use: No  . Sexual activity: Not on file  Other Topics Concern  . Not on file  Social History Narrative   Widowed, lives alone with 2 dogs   Social Determinants of Health   Financial Resource Strain: Not on file  Food Insecurity: Not on file  Transportation Needs: Not on  file  Physical Activity: Not on file  Stress: Not on file  Social Connections: Not on file  Intimate Partner Violence: Not on file     ROS- All systems are reviewed and negative except as per the HPI above.  Physical Exam: Vitals:   06/17/20 1121  BP: 130/90  Pulse: 96  Weight: 72.1 kg  Height: '5\' 2"'$  (1.575 m)    GEN- The patient is a well appearing elderly female, alert and oriented x 3 today.   Head- normocephalic, atraumatic Eyes-  Sclera clear, conjunctiva pink Ears- hearing intact Oropharynx- clear Neck- supple  Lungs- Clear to ausculation bilaterally, normal work of breathing Heart- Regular rate and rhythm, no murmurs, rubs or gallops  GI- soft, NT, ND, + BS Extremities- no clubbing, cyanosis, or edema MS- no significant deformity or atrophy Skin- no rash or lesion Psych- euthymic mood, full affect Neuro- strength and sensation are intact  Wt Readings from Last 3 Encounters:  06/17/20 72.1 kg  04/29/20 76.2 kg  01/14/20 76.9 kg    EKG today demonstrates  SR, PAC Vent. rate 96 BPM PR interval 176 ms QRS duration 88 ms QT/QTc 376/475 ms  Epic records are reviewed at length today  CHA2DS2-VASc Score = 4  The patient's score is based upon: CHF History: No HTN History: Yes Diabetes History: No Stroke History: No Vascular Disease History: No Age Score: 2 Gender Score: 1      ASSESSMENT AND PLAN: 1. Frequent PACs/disorganized atrial activity The patient's CHA2DS2-VASc score is 4, indicating a 4.8% annual risk of stroke.   ECGs from ED appears to be SR with salvos of disorganized atrial activity. She is in SR today. Will plan to check echocardiogram and have her wear a cardiac monitor once her gallbladder issues are resolved.  No indication for anticoagulation at this time.  2. HTN Stable, no changes today.   Follow up in the AF clinic in one month.    [facility] [address] [place], [place]  [address] [phone] 06/17/2020 11:27 AM

## 2020-06-18 ENCOUNTER — Other Ambulatory Visit: Payer: Self-pay

## 2020-06-18 ENCOUNTER — Ambulatory Visit (INDEPENDENT_AMBULATORY_CARE_PROVIDER_SITE_OTHER): Payer: Medicare HMO | Admitting: Student

## 2020-06-18 ENCOUNTER — Encounter: Payer: Self-pay | Admitting: Student

## 2020-06-18 VITALS — BP 155/83 | HR 84 | Temp 98.1°F | Ht 62.0 in | Wt 160.1 lb

## 2020-06-18 DIAGNOSIS — D509 Iron deficiency anemia, unspecified: Secondary | ICD-10-CM | POA: Insufficient documentation

## 2020-06-18 DIAGNOSIS — D649 Anemia, unspecified: Secondary | ICD-10-CM

## 2020-06-18 DIAGNOSIS — K8020 Calculus of gallbladder without cholecystitis without obstruction: Secondary | ICD-10-CM | POA: Diagnosis not present

## 2020-06-18 DIAGNOSIS — R053 Chronic cough: Secondary | ICD-10-CM | POA: Diagnosis not present

## 2020-06-18 NOTE — Assessment & Plan Note (Signed)
Patient reports that she has had non-productive cough for years, which has worsened recently. Previously had been seen by ENT, which prescribed claritin and flonase. She states these medications did not help her symptoms. She was then told her gallbladder could possibly be exaggerating GERD symptoms. She takes Pepto-Bismol, but minimal relief. Denies fevers, chills, dyspnea.  A/P: Discussed with patient that it is possible that her symptoms are due to GERD and exacerbated by gallbladder issues. It was decided that we will focus on surgical consultation for galbladder removal and we can trial PPI once she recovers. Patient verbalized understanding and agrees with plan. - Trial PPI at next appointment if symptoms have not improved after surgery

## 2020-06-18 NOTE — Patient Instructions (Signed)
Ms. Niklas,   It was a pleasure seeing you today!  Today we discussed your recent visit to the Emergency Department for your gallbladder. Thankfully, I was able to get you an appointment with Dr. Kieth Brightly at [facility] tomorrow, 06/19/2020, at 11:10AM. You must arrive no later than 10:40AM in order to complete registration.  Otherwise, we would be happy to keep seeing you in clinic. I will be in clinic in March, June, September, and December. Dr. Konrad Penta will only be in clinic in April, as she will be leaving [facility] in June.   We look forward to seeing you next time. Please call our clinic at [phone] if you have any questions or concerns. The best time to call is Monday-Friday from 9am-4pm, but there is someone available 24/7 at the same number. If you need medication refills, please notify your pharmacy one week in advance and they will send [place] a request.  Thank you for letting us take part in your care. Wishing you the best!  Thank you, Dr. Sanjuan Dame, MD

## 2020-06-18 NOTE — Assessment & Plan Note (Signed)
Per chart review, patient has had newly-diagnosed anemia that was not present over a year ago. She does report some dark stools in the setting of Pepto-bismol intake. Up to date on colon cancer screening. During ED visit last week, found to have microcytic anemia.   A/P: Given acuity of cholelithiasis and decreased po intake, discussed with patient that we will hold off referral for GI until after surgical consultation. Will run anemia panel today. - F/u CBC, iron studies - Discuss GI referral in one month

## 2020-06-18 NOTE — Progress Notes (Signed)
   CC: upper abdominal pain  HPI:  Ms.Special Sinnett is a 75 y.o. with hx gallstones, colonic perforation s/p resection, thyroid nodule, hyperlipidemia, hypertension, anxiety presenting to [facility] today for follow-up from ED visit last week for cholelithiasis.  Please see problem-based list for further details, assessments, and plans.  Past Medical History:  Diagnosis Date  . Anxiety   . Arthritis   . Diverticula, colon 1999  . Heart murmur   . History of blood transfusion   . Hyperlipidemia   . Hypertension   . Osteoarthritis of knee    right   Review of Systems: As per HPI.  Physical Exam:  Vitals:   06/18/20 0958  BP: (!) 155/83  Pulse: 84  Temp: 98.1 F (36.7 C)  TempSrc: Oral  SpO2: 97%  Weight: 160 lb 1.6 oz (72.6 kg)  Height: '5\' 2"'$  (1.575 m)   General: Sitting comfortably in chair, no acute distress HEENT: Normocephalic, atraumatic. No scleral icterus. Vision grossly in tact. CV: Regular rate, rhythm. No m/r/g appreciated. Pulm: Normal WOB. Clear to auscultation bilaterally. No wheezing, rales. GI: Soft, non-distended. Mildly tender in RUQ, epigastric regions. Decreased bowel sounds.  Assessment & Plan:   See Encounters Tab for problem based charting.  Patient discussed with Dr. Jimmye Norman.

## 2020-06-18 NOTE — Assessment & Plan Note (Addendum)
Patient initially presented to ED last week with two weeks of worsening RUQ pain, found to have cholelithiasis without signs of cholecystitis. She was previously evaluated for gallstones "years ago" but never followed up with surgery. At time of discharge from ED, referred to general surgery and presenting to [facility] clinic today to follow-up. She says the pain has continued since her ED visit. She has attempted calling general surgery multiple times but has not been able to get through. Reports she has had decreased appetite and can only eat chicken broth. Does mention some intermittent nausea, no vomiting. She does report diarrhea over the last week or so, she believes likely due to her liquid diet. Denies fevers, chills, chest pain, dyspnea.   Of note, she has not been able to take her regular medications over the past few days due to her symptoms.  A/P: Patient without signs of cholecystitis. Called [facility] to schedule appointment tomorrow, 06/19/20 at 11:10AM. Will collect BMP today to check renal function, patient to follow-up in one month. - BMP today

## 2020-06-19 ENCOUNTER — Other Ambulatory Visit: Payer: Self-pay | Admitting: Student

## 2020-06-19 DIAGNOSIS — E876 Hypokalemia: Secondary | ICD-10-CM

## 2020-06-19 DIAGNOSIS — K8010 Calculus of gallbladder with chronic cholecystitis without obstruction: Secondary | ICD-10-CM | POA: Diagnosis not present

## 2020-06-19 LAB — ANEMIA PROFILE B
Basophils Absolute: 0.1 10*3/uL (ref 0.0–0.2)
Basos: 1 %
EOS (ABSOLUTE): 0.2 10*3/uL (ref 0.0–0.4)
Eos: 2 %
Ferritin: 158 ng/mL — ABNORMAL HIGH (ref 15–150)
Folate: 8.2 ng/mL (ref >3.0)
Hematocrit: 37.7 % (ref 34.0–46.6)
Hemoglobin: 12 g/dL (ref 11.1–15.9)
Immature Grans (Abs): 0.1 10*3/uL (ref 0.0–0.1)
Immature Granulocytes: 1 %
Iron Saturation: 15 % (ref 15–55)
Iron: 31 ug/dL (ref 27–139)
Lymphocytes Absolute: 1.5 10*3/uL (ref 0.7–3.1)
Lymphs: 16 %
MCH: 23.3 pg — ABNORMAL LOW (ref 26.6–33.0)
MCHC: 31.8 g/dL (ref 31.5–35.7)
MCV: 73 fL — ABNORMAL LOW (ref 79–97)
Monocytes Absolute: 0.7 10*3/uL (ref 0.1–0.9)
Monocytes: 7 %
Neutrophils Absolute: 7 10*3/uL (ref 1.4–7.0)
Neutrophils: 73 %
Platelets: 393 10*3/uL (ref 150–450)
RBC: 5.15 x10E6/uL (ref 3.77–5.28)
RDW: 16.7 % — ABNORMAL HIGH (ref 11.7–15.4)
Retic Ct Pct: 1.3 % (ref 0.6–2.6)
Total Iron Binding Capacity: 204 ug/dL — ABNORMAL LOW (ref 250–450)
UIBC: 173 ug/dL (ref 118–369)
Vitamin B-12: 453 pg/mL (ref 232–1245)
WBC: 9.5 10*3/uL (ref 3.4–10.8)

## 2020-06-19 LAB — BMP8+ANION GAP
Anion Gap: 22 mmol/L — ABNORMAL HIGH (ref 10.0–18.0)
BUN/Creatinine Ratio: 8 — ABNORMAL LOW (ref 12–28)
BUN: 10 mg/dL (ref 8–27)
CO2: 22 mmol/L (ref 20–29)
Calcium: 9.5 mg/dL (ref 8.7–10.3)
Chloride: 94 mmol/L — ABNORMAL LOW (ref 96–106)
Creatinine, Ser: 1.23 mg/dL — ABNORMAL HIGH (ref 0.57–1.00)
Glucose: 112 mg/dL — ABNORMAL HIGH (ref 65–99)
Potassium: 2.7 mmol/L — ABNORMAL LOW (ref 3.5–5.2)
Sodium: 138 mmol/L (ref 134–144)
eGFR: 46 mL/min/{1.73_m2} — ABNORMAL LOW (ref >59)

## 2020-06-19 MED ORDER — POTASSIUM CHLORIDE ER 10 MEQ PO TBCR: 10.0000 meq | EXTENDED_RELEASE_TABLET | Freq: Three times a day (TID) | ORAL | 0 refills | Status: DC

## 2020-06-30 ENCOUNTER — Observation Stay (HOSPITAL_COMMUNITY)
Admission: EM | Admit: 2020-06-30 | Discharge: 2020-07-03 | Disposition: A | Discharge status: Home / Self Care | Payer: Medicare HMO | Attending: General Surgery | Admitting: General Surgery

## 2020-06-30 ENCOUNTER — Emergency Department (HOSPITAL_COMMUNITY): Payer: Medicare HMO

## 2020-06-30 ENCOUNTER — Other Ambulatory Visit: Payer: Self-pay

## 2020-06-30 ENCOUNTER — Encounter (HOSPITAL_COMMUNITY): Payer: Self-pay | Admitting: Emergency Medicine

## 2020-06-30 DIAGNOSIS — Z20822 Contact with and (suspected) exposure to covid-19: Secondary | ICD-10-CM | POA: Diagnosis not present

## 2020-06-30 DIAGNOSIS — R011 Cardiac murmur, unspecified: Secondary | ICD-10-CM | POA: Diagnosis not present

## 2020-06-30 DIAGNOSIS — R2681 Unsteadiness on feet: Secondary | ICD-10-CM | POA: Diagnosis not present

## 2020-06-30 DIAGNOSIS — R1011 Right upper quadrant pain: Secondary | ICD-10-CM

## 2020-06-30 DIAGNOSIS — K8066 Calculus of gallbladder and bile duct with acute and chronic cholecystitis without obstruction: Secondary | ICD-10-CM | POA: Diagnosis not present

## 2020-06-30 DIAGNOSIS — F419 Anxiety disorder, unspecified: Secondary | ICD-10-CM | POA: Diagnosis not present

## 2020-06-30 DIAGNOSIS — E785 Hyperlipidemia, unspecified: Secondary | ICD-10-CM | POA: Diagnosis not present

## 2020-06-30 DIAGNOSIS — I10 Essential (primary) hypertension: Secondary | ICD-10-CM

## 2020-06-30 DIAGNOSIS — R69 Illness, unspecified: Secondary | ICD-10-CM | POA: Diagnosis not present

## 2020-06-30 DIAGNOSIS — K8013 Calculus of gallbladder with acute and chronic cholecystitis with obstruction: Secondary | ICD-10-CM | POA: Diagnosis not present

## 2020-06-30 DIAGNOSIS — Z79899 Other long term (current) drug therapy: Secondary | ICD-10-CM | POA: Diagnosis not present

## 2020-06-30 DIAGNOSIS — R112 Nausea with vomiting, unspecified: Secondary | ICD-10-CM | POA: Insufficient documentation

## 2020-06-30 DIAGNOSIS — E876 Hypokalemia: Secondary | ICD-10-CM | POA: Diagnosis not present

## 2020-06-30 DIAGNOSIS — R0602 Shortness of breath: Secondary | ICD-10-CM | POA: Diagnosis not present

## 2020-06-30 DIAGNOSIS — Z419 Encounter for procedure for purposes other than remedying health state, unspecified: Secondary | ICD-10-CM

## 2020-06-30 DIAGNOSIS — K8020 Calculus of gallbladder without cholecystitis without obstruction: Secondary | ICD-10-CM | POA: Diagnosis not present

## 2020-06-30 HISTORY — DX: Other specified postprocedural states: Z98.890

## 2020-06-30 HISTORY — DX: Other specified postprocedural states: R11.2

## 2020-06-30 LAB — COMPREHENSIVE METABOLIC PANEL
ALT: 9 U/L (ref 0–44)
ALT: 8 U/L (ref 0–44)
AST: 13 U/L — ABNORMAL LOW (ref 15–41)
AST: 13 U/L — ABNORMAL LOW (ref 15–41)
Albumin: 3.6 g/dL (ref 3.5–5.0)
Albumin: 3.3 g/dL — ABNORMAL LOW (ref 3.5–5.0)
Alkaline Phosphatase: 85 U/L (ref 38–126)
Alkaline Phosphatase: 82 U/L (ref 38–126)
Anion gap: 12 (ref 5–15)
Anion gap: 8 (ref 5–15)
BUN: 9 mg/dL (ref 8–23)
BUN: 8 mg/dL (ref 8–23)
CO2: 24 mmol/L (ref 22–32)
CO2: 24 mmol/L (ref 22–32)
Calcium: 9.5 mg/dL (ref 8.9–10.3)
Calcium: 9 mg/dL (ref 8.9–10.3)
Chloride: 99 mmol/L (ref 98–111)
Chloride: 103 mmol/L (ref 98–111)
Creatinine, Ser: 1.24 mg/dL — ABNORMAL HIGH (ref 0.44–1.00)
Creatinine, Ser: 0.96 mg/dL (ref 0.44–1.00)
GFR, Estimated: 45 mL/min — ABNORMAL LOW (ref >60)
GFR, Estimated: >60 mL/min (ref >60)
Glucose, Bld: 156 mg/dL — ABNORMAL HIGH (ref 70–99)
Glucose, Bld: 145 mg/dL — ABNORMAL HIGH (ref 70–99)
Potassium: 2.6 mmol/L — CL (ref 3.5–5.1)
Potassium: 3.4 mmol/L — ABNORMAL LOW (ref 3.5–5.1)
Sodium: 135 mmol/L (ref 135–145)
Sodium: 135 mmol/L (ref 135–145)
Total Bilirubin: 1.1 mg/dL (ref 0.3–1.2)
Total Bilirubin: 0.8 mg/dL (ref 0.3–1.2)
Total Protein: 7.3 g/dL (ref 6.5–8.1)
Total Protein: 6.7 g/dL (ref 6.5–8.1)

## 2020-06-30 LAB — CBC
HCT: 37.6 % (ref 36.0–46.0)
HCT: 34.8 % — ABNORMAL LOW (ref 36.0–46.0)
Hemoglobin: 12 g/dL (ref 12.0–15.0)
Hemoglobin: 11.1 g/dL — ABNORMAL LOW (ref 12.0–15.0)
MCH: 23.6 pg — ABNORMAL LOW (ref 26.0–34.0)
MCH: 23.6 pg — ABNORMAL LOW (ref 26.0–34.0)
MCHC: 31.9 g/dL (ref 30.0–36.0)
MCHC: 31.9 g/dL (ref 30.0–36.0)
MCV: 73.9 fL — ABNORMAL LOW (ref 80.0–100.0)
MCV: 73.9 fL — ABNORMAL LOW (ref 80.0–100.0)
Platelets: 310 10*3/uL (ref 150–400)
Platelets: 266 10*3/uL (ref 150–400)
RBC: 5.09 MIL/uL (ref 3.87–5.11)
RBC: 4.71 MIL/uL (ref 3.87–5.11)
RDW: 16.7 % — ABNORMAL HIGH (ref 11.5–15.5)
RDW: 16.5 % — ABNORMAL HIGH (ref 11.5–15.5)
WBC: 7.1 10*3/uL (ref 4.0–10.5)
WBC: 7.1 10*3/uL (ref 4.0–10.5)
nRBC: 0 % (ref 0.0–0.2)
nRBC: 0 % (ref 0.0–0.2)

## 2020-06-30 LAB — RESP PANEL BY RT-PCR (FLU A&B, COVID) ARPGX2
Influenza A by PCR: NEGATIVE
Influenza B by PCR: NEGATIVE
SARS Coronavirus 2 by RT PCR: NEGATIVE

## 2020-06-30 LAB — URINALYSIS, ROUTINE W REFLEX MICROSCOPIC
Bilirubin Urine: NEGATIVE
Glucose, UA: NEGATIVE mg/dL
Ketones, ur: 5 mg/dL — AB
Leukocytes,Ua: NEGATIVE
Nitrite: NEGATIVE
Protein, ur: NEGATIVE mg/dL
Specific Gravity, Urine: 1.002 — ABNORMAL LOW (ref 1.005–1.030)
pH: 7 (ref 5.0–8.0)

## 2020-06-30 LAB — PHOSPHORUS: Phosphorus: 2.5 mg/dL (ref 2.5–4.6)

## 2020-06-30 LAB — HEMOGLOBIN A1C
Hgb A1c MFr Bld: 5.6 % (ref 4.8–5.6)
Mean Plasma Glucose: 114.02 mg/dL

## 2020-06-30 LAB — MAGNESIUM: Magnesium: 2 mg/dL (ref 1.7–2.4)

## 2020-06-30 LAB — GLUCOSE, CAPILLARY: Glucose-Capillary: 98 mg/dL (ref 70–99)

## 2020-06-30 LAB — LIPASE, BLOOD: Lipase: 29 U/L (ref 11–51)

## 2020-06-30 MED ORDER — HYDROCHLOROTHIAZIDE 25 MG PO TABS: 25.0000 mg | ORAL_TABLET | Freq: Every day | ORAL | Status: DC

## 2020-06-30 MED ORDER — SODIUM CHLORIDE 0.9 % IV BOLUS
1000.0000 mL | Freq: Once | INTRAVENOUS | Status: AC
  Administered 2020-06-30: 1000 mL via INTRAVENOUS

## 2020-06-30 MED ORDER — ACETAMINOPHEN 500 MG PO TABS
1000.0000 mg | ORAL_TABLET | Freq: Once | ORAL | Status: AC
  Administered 2020-07-01: 1000 mg via ORAL
  Filled 2020-06-30: qty 2

## 2020-06-30 MED ORDER — DOCUSATE SODIUM 100 MG PO CAPS
100.0000 mg | ORAL_CAPSULE | Freq: Two times a day (BID) | ORAL | Status: DC
  Administered 2020-07-02 – 2020-07-03 (×2): 100 mg via ORAL
  Filled 2020-06-30 (×3): qty 1

## 2020-06-30 MED ORDER — ACETAMINOPHEN 650 MG RE SUPP: 650.0000 mg | Freq: Four times a day (QID) | RECTAL | Status: DC | PRN

## 2020-06-30 MED ORDER — HYDRALAZINE HCL 20 MG/ML IJ SOLN: 10.0000 mg | INTRAMUSCULAR | Status: DC | PRN

## 2020-06-30 MED ORDER — OXYCODONE HCL 5 MG PO TABS
5.0000 mg | ORAL_TABLET | ORAL | Status: DC | PRN
  Administered 2020-07-01 – 2020-07-03 (×3): 5 mg via ORAL
  Filled 2020-06-30: qty 2
  Filled 2020-06-30 (×2): qty 1

## 2020-06-30 MED ORDER — INSULIN ASPART 100 UNIT/ML ~~LOC~~ SOLN
0.0000 [IU] | Freq: Three times a day (TID) | SUBCUTANEOUS | Status: DC
  Administered 2020-07-01: 3 [IU] via SUBCUTANEOUS
  Administered 2020-07-01 – 2020-07-02 (×2): 1 [IU] via SUBCUTANEOUS
  Administered 2020-07-02: 5 [IU] via SUBCUTANEOUS

## 2020-06-30 MED ORDER — FAMOTIDINE IN NACL 20-0.9 MG/50ML-% IV SOLN
20.0000 mg | Freq: Once | INTRAVENOUS | Status: AC
  Administered 2020-06-30: 20 mg via INTRAVENOUS
  Filled 2020-06-30: qty 50

## 2020-06-30 MED ORDER — DIPHENHYDRAMINE HCL 12.5 MG/5ML PO ELIX: 12.5000 mg | ORAL_SOLUTION | Freq: Four times a day (QID) | ORAL | Status: DC | PRN

## 2020-06-30 MED ORDER — DIPHENHYDRAMINE HCL 50 MG/ML IJ SOLN
12.5000 mg | Freq: Four times a day (QID) | INTRAMUSCULAR | Status: DC | PRN
Start: 2020-06-30 — End: 2020-07-03

## 2020-06-30 MED ORDER — LORATADINE 10 MG PO TABS
10.0000 mg | ORAL_TABLET | Freq: Every day | ORAL | Status: DC
  Administered 2020-07-02 – 2020-07-03 (×2): 10 mg via ORAL
  Filled 2020-06-30 (×2): qty 1

## 2020-06-30 MED ORDER — POTASSIUM CHLORIDE 10 MEQ/100ML IV SOLN
10.0000 meq | INTRAVENOUS | Status: AC
  Administered 2020-06-30 (×3): 10 meq via INTRAVENOUS
  Filled 2020-06-30 (×3): qty 100

## 2020-06-30 MED ORDER — ACETAMINOPHEN 325 MG PO TABS
650.0000 mg | ORAL_TABLET | Freq: Four times a day (QID) | ORAL | Status: DC | PRN
  Administered 2020-07-02: 650 mg via ORAL
  Filled 2020-06-30: qty 2

## 2020-06-30 MED ORDER — POTASSIUM CHLORIDE CRYS ER 20 MEQ PO TBCR
40.0000 meq | EXTENDED_RELEASE_TABLET | Freq: Once | ORAL | Status: AC
  Administered 2020-06-30: 40 meq via ORAL
  Filled 2020-06-30: qty 2

## 2020-06-30 MED ORDER — KCL IN DEXTROSE-NACL 20-5-0.9 MEQ/L-%-% IV SOLN
INTRAVENOUS | Status: DC
  Filled 2020-06-30 (×3): qty 1000

## 2020-06-30 MED ORDER — CIPROFLOXACIN IN D5W 400 MG/200ML IV SOLN
400.0000 mg | INTRAVENOUS | Status: AC
  Administered 2020-07-01: 200 mg via INTRAVENOUS
  Filled 2020-06-30 (×2): qty 200

## 2020-06-30 MED ORDER — ENOXAPARIN SODIUM 40 MG/0.4ML ~~LOC~~ SOLN
40.0000 mg | SUBCUTANEOUS | Status: DC
  Administered 2020-06-30: 40 mg via SUBCUTANEOUS
  Filled 2020-06-30: qty 0.4

## 2020-06-30 MED ORDER — POLYETHYLENE GLYCOL 3350 17 G PO PACK
17.0000 g | PACK | Freq: Every day | ORAL | Status: DC | PRN
Start: 2020-06-30 — End: 2020-07-03

## 2020-06-30 MED ORDER — MORPHINE SULFATE (PF) 2 MG/ML IV SOLN: 2.0000 mg | INTRAVENOUS | Status: DC | PRN

## 2020-06-30 MED ORDER — ALBUTEROL SULFATE HFA 108 (90 BASE) MCG/ACT IN AERS
2.0000 | INHALATION_SPRAY | Freq: Four times a day (QID) | RESPIRATORY_TRACT | Status: DC | PRN
  Filled 2020-06-30: qty 6.7

## 2020-06-30 MED ORDER — METHOCARBAMOL 500 MG PO TABS: 500.0000 mg | ORAL_TABLET | Freq: Four times a day (QID) | ORAL | Status: DC | PRN

## 2020-06-30 MED ORDER — ONDANSETRON HCL 4 MG/2ML IJ SOLN: 4.0000 mg | Freq: Four times a day (QID) | INTRAMUSCULAR | Status: DC | PRN

## 2020-06-30 MED ORDER — ONDANSETRON 4 MG PO TBDP: 4.0000 mg | ORAL_TABLET | Freq: Four times a day (QID) | ORAL | Status: DC | PRN

## 2020-06-30 MED ORDER — AMLODIPINE BESYLATE 10 MG PO TABS
10.0000 mg | ORAL_TABLET | Freq: Every day | ORAL | Status: DC
  Administered 2020-07-02 – 2020-07-03 (×2): 10 mg via ORAL
  Filled 2020-06-30 (×2): qty 1

## 2020-06-30 MED ORDER — ONDANSETRON HCL 4 MG/2ML IJ SOLN
4.0000 mg | Freq: Once | INTRAMUSCULAR | Status: AC
  Administered 2020-06-30: 4 mg via INTRAVENOUS
  Filled 2020-06-30: qty 2

## 2020-06-30 MED ORDER — ALPRAZOLAM 0.5 MG PO TABS
0.5000 mg | ORAL_TABLET | Freq: Every evening | ORAL | Status: DC | PRN
  Administered 2020-07-01 – 2020-07-02 (×2): 0.5 mg via ORAL
  Filled 2020-06-30 (×2): qty 1

## 2020-06-30 NOTE — ED Triage Notes (Signed)
Patient coming from home. Complaint of constant nausea and inability to eat. Patient states she is supposed to have her gallbladder removed soon but has not been feeling well the last two days.

## 2020-06-30 NOTE — ED Provider Notes (Addendum)
[place] [facility] EMERGENCY DEPARTMENT Provider Note   CSN: [id number] Arrival date & time: 06/30/20  1300     History Chief Complaint  Patient presents with  . Nausea  . Flank Pain    Donna Lawson is a 75 y.o. female history of Colelithiasis without cholecystitis, hypertension, hyperlipidemia.  Patient presents with a chief complaint of abdominal pain.  It is located to her epigastric area and right upper quadrant.  Patient reports that her pain has been constant, and progressively worsening since being seen at [facility] emergency department with similar complaints on 06/11/2020.  Patient rates her pain as 9/10 on the pain scale with radiation to her back.  Patient reports that her pain is worse after eating food and when she takes her oral potassium pills.  Patient denies any alleviating factors.  Patient reports associated nausea and vomiting.  Patient reports that she has vomited 3 times in the last 24 hours.  Patient describes her emesis as "watery."  Patient denies any bloody emesis or coffee-ground emesis.  Patient reports that her last bowel movement was yesterday.  Reports that she has been having diarrhea.  Patient denies any melena or blood in her stool.  Patient has taken Pepto-Bismol for her diarrhea with no relief of symptoms.  Patient also reports decreased oral intake due to her abdominal pain, nausea, and vomiting.  Patient states that she has had minimal p.o. intake for the last 2 days.  Patient states that she was able to have a few bites of toast and tea this morning around 1130.  Patient was able to hold these items down.  Patient also endorses shortness of breath which she attributes to her epigastric pain and inability to take full deep breaths.  Patient reports she has a chronic dry cough which she has been told is due to GERD.  Patient denies any fevers, chills, chest pain, leg swelling, palpitations, dysuria, urinary frequency, hematuria, vaginal bleeding,  vaginal pain, genital sores or lesions, lightheadedness, dizziness, headache, syncopal episodes.  Per chart review patient was seen on 06/11/2020 complaint of abdominal pain.  CTA abdomen pelvis was negative, right upper quadrant ultrasound showed cholelithiasis with no signs of acute cholecystitis.  She did have hypokalemia with potassium of 2.5. Patient was given information to follow-up with general surgeon Pamalee Leyden, MD and has an appointment scheduled for 25 March.  Patient reports that she followed up with her primary care provider to have her potassium reevaluated.  Potassium was found to be 2.7 on 06/18/2020.  Patient was instructed to take 40 mEq of potassium 3 times a day.  Reports that she has slowly been decreasing the amount of potassium she has been taking due to her abdominal pain.  Patient endorses history of bowel resection after complications from colonoscopy.       HPI     Past Medical History:  Diagnosis Date  . Anxiety   . Arthritis   . Diverticula, colon 1999  . Heart murmur   . History of blood transfusion   . Hyperlipidemia   . Hypertension   . Osteoarthritis of knee    right    Patient Active Problem List   Diagnosis Date Noted  . Cholelithiasis without cholecystitis 06/18/2020  . Chronic cough 06/18/2020  . Acute anemia 06/18/2020  . PAC (premature atrial contraction) 06/17/2020  . Dysuria 04/06/2017  . Anxiety 03/29/2013  . Hypertension   . Hyperlipidemia   . Osteoarthritis of knee   . Obese  Past Surgical History:  Procedure Laterality Date  . ABDOMINAL SURGERY    . COLONOSCOPY    . NO PAST SURGERIES       OB History   No obstetric history on file.     Family History  Problem Relation Age of Onset  . Arthritis Mother   . Arthritis Father   . Hypertension Other     Social History   Tobacco Use  . Smoking status: Former Research scientist (life sciences)  . Smokeless tobacco: Never Used  Substance Use Topics  . Alcohol use: No    Alcohol/week:  0.0 standard drinks  . Drug use: No    Home Medications Prior to Admission medications   Medication Sig Start Date End Date Taking? Authorizing Provider  potassium chloride (KLOR-CON) 10 MEQ tablet Take 1 tablet (10 mEq total) by mouth 3 (three) times daily for 21 doses. 06/19/20 06/26/20  Sanjuan Dame, MD  acetaminophen (TYLENOL) 500 MG tablet Take 500 mg by mouth every 6 (six) hours as needed for moderate pain or headache.    [provider]  albuterol (VENTOLIN HFA) 108 (90 Base) MCG/ACT inhaler Inhale 2 puffs into the lungs every 6 (six) hours as needed for wheezing or shortness of breath. 04/29/20   Freddi Starr, MD  ALPRAZolam Duanne Moron) 0.5 MG tablet Take 1 tablet (0.5 mg total) at bedtime as needed by mouth for anxiety or sleep. 02/25/17   Nche, Charlene Brooke, NP  amLODipine (NORVASC) 10 MG tablet Take 1 tablet (10 mg total) daily by mouth. 02/25/17   Nche, Charlene Brooke, NP  hydrochlorothiazide (HYDRODIURIL) 25 MG tablet Take 1 tablet (25 mg total) daily by mouth. 02/25/17   Nche, Charlene Brooke, NP  loratadine (CLARITIN) 10 MG tablet Take 1 tablet (10 mg total) by mouth daily. 04/29/20   Freddi Starr, MD  lisinopril-hydrochlorothiazide (PRINZIDE,ZESTORETIC) 20-25 MG per tablet Take 1 tablet by mouth 2 (two) times daily. Take 1 by mouth daily 03/17/12 05/10/12  [provider]    Allergies    Atorvastatin, Cefuroxime axetil, Crestor [rosuvastatin calcium], Lisinopril, and Oxycodone-acetaminophen  Review of Systems   Review of Systems  Constitutional: Positive for fatigue. Negative for chills and fever.  Eyes: Negative for visual disturbance.  Respiratory: Positive for cough and shortness of breath. Negative for wheezing and stridor.   Cardiovascular: Negative for chest pain.  Gastrointestinal: Positive for abdominal pain, diarrhea, nausea and vomiting. Negative for abdominal distention, anal bleeding, blood in stool, constipation and rectal pain.   Genitourinary: Negative for difficulty urinating, dysuria, frequency, genital sores, hematuria, vaginal bleeding, vaginal discharge and vaginal pain.  Musculoskeletal: Negative for back pain and neck pain.  Skin: Negative for color change and rash.  Neurological: Negative for dizziness, syncope, light-headedness and headaches.  Psychiatric/Behavioral: Negative for confusion.    Physical Exam Updated Vital Signs BP (!) 155/86   Pulse 85   Temp 98.2 F (36.8 C) (Oral)   Resp 18   SpO2 94%   Physical Exam Vitals and nursing note reviewed.  Constitutional:      General: She is not in acute distress.    Appearance: She is not ill-appearing, toxic-appearing or diaphoretic.  HENT:     Head: Normocephalic.  Eyes:     General: No scleral icterus.       Right eye: No discharge.        Left eye: No discharge.  Cardiovascular:     Rate and Rhythm: Normal rate.     Heart sounds: Normal heart sounds.  Pulmonary:     Effort: Pulmonary effort is normal. No respiratory distress.     Breath sounds: Normal breath sounds. No stridor. No wheezing, rhonchi or rales.  Abdominal:     General: A surgical scar is present. There is no distension. There are no signs of injury.     Palpations: Abdomen is soft. There is no mass or pulsatile mass.     Tenderness: There is abdominal tenderness in the right upper quadrant and epigastric area. There is no right CVA tenderness, left CVA tenderness, guarding or rebound. Positive signs include Murphy's sign. Negative signs include McBurney's sign and psoas sign.     Hernia: There is no hernia in the umbilical area or ventral area.  Musculoskeletal:     Cervical back: Normal range of motion and neck supple.     Right lower leg: No swelling, deformity, lacerations, tenderness or bony tenderness. No edema.     Left lower leg: No swelling, deformity, lacerations, tenderness or bony tenderness. No edema.  Skin:    General: Skin is warm and dry.     Coloration:  Skin is not pale.  Neurological:     General: No focal deficit present.     Mental Status: She is alert.  Psychiatric:        Behavior: Behavior is cooperative.     ED Results / Procedures / Treatments   Labs (all labs ordered are listed, but only abnormal results are displayed) Labs Reviewed  COMPREHENSIVE METABOLIC PANEL - Abnormal; Notable for the following components:      Result Value   Potassium 2.6 (*)    Glucose, Bld 156 (*)    Creatinine, Ser 1.24 (*)    AST 13 (*)    GFR, Estimated 45 (*)    All other components within normal limits  CBC - Abnormal; Notable for the following components:   MCV 73.9 (*)    MCH 23.6 (*)    RDW 16.7 (*)    All other components within normal limits  URINE CULTURE  LIPASE, BLOOD  MAGNESIUM  URINALYSIS, ROUTINE W REFLEX MICROSCOPIC    EKG None  Radiology No results found.  Procedures .Critical Care Performed by: Loni Beckwith, PA-C Authorized by: Loni Beckwith, PA-C   Critical care provider statement:    Critical care time (minutes):  45   Critical care was necessary to treat or prevent imminent or life-threatening deterioration of the following conditions:  Metabolic crisis   Critical care was time spent personally by me on the following activities:  Discussions with consultants, evaluation of patient's response to treatment, examination of patient, ordering and performing treatments and interventions, ordering and review of laboratory studies, ordering and review of radiographic studies, pulse oximetry, re-evaluation of patient's condition, obtaining history from patient or surrogate and review of old charts Comments:     Patient's hypokalemia requiring IV potassium met criteria for critical care      Medications Ordered in ED Medications  potassium chloride 10 mEq in 100 mL IVPB (10 mEq Intravenous New Bag/Given 06/30/20 1501)  famotidine (PEPCID) IVPB 20 mg premix (has no administration in time range)   potassium chloride SA (KLOR-CON) CR tablet 40 mEq (40 mEq Oral Given 06/30/20 1456)  sodium chloride 0.9 % bolus 1,000 mL (1,000 mLs Intravenous New Bag/Given 06/30/20 1456)    ED Course  I have reviewed the triage vital signs and the nursing notes.  Pertinent labs & imaging results that were available during my care of  the patient were reviewed by me and considered in my medical decision making (see chart for details).  Clinical Course as of 06/30/20 1511  Mon Jun 30, 2020  1432 Advised by nurse that potassium was 2.6.  Will order 40 mEq oral potassium, 10 mEq IV potassium x3. [PB]    Clinical Course User Index [PB] Dyann Ruddle   MDM Rules/Calculators/A&P                          Alert 75 year old female no acute distress, nontoxic-appearing.  Patient presents with chief complaint of epigastric and right upper quadrant abdominal pain.  Patient reports that her pain is worse after eating.  Patient endorses nausea, vomiting, diarrhea and fatigue.    Per chart review patient was seen on 06/11/2020 complaint of abdominal pain.  CTA abdomen pelvis was negative, right upper quadrant ultrasound showed cholelithiasis with no signs of acute cholecystitis.  She did have hypokalemia with potassium of 2.5. Patient was given information to follow-up with general surgeon Pamalee Leyden, MD  Patient reports that her symptoms have progressively worsened since being seen on 2/23.  Reports decreased oral intake over the last 2 days due to her complaints of pain, nausea, and vomiting.  Patient was able to eat toast and drink tea at approximately 1130 this morning.  On physical exam normoactive bowel sounds, abdomen soft, nondistended, tenderness to epigastric and right upper quadrant.  Positive Murphy sign.  No guarding or rebound tenderness, no tenderness over McBurney's point, no CVA tenderness.  CMP shows potassium at 2.6.  EKG ordered.  Oral potassium 40 mEq and 10 mEq IV x3 were  ordered.  Patient was able to swallow oral potassium without difficulty or complaints of pain. Low potassium likely due to decreased oral intake as well as nausea and vomiting.   Magnesium within normal limits. Creatinine elevated at 1.24; likely due to decreased fluid intake.  She has no history of CHF will give 1 L normal saline fluid bolus.  CBC shows no leukocytosis or anemia.  Lipase within normal limits, low suspicion for acute pancreatitis.  UA and urine culture pending.  Will order ultrasound right upper quadrant to evaluate gallbladder to evaluate for any signs of cholecystitis.  Due to patient's recurrent symptoms of cholelithiasis we will reach out to general surgery for possible surgical intervention.  Patient has not seen any general surgeon for her cholelithiasis previously.  Spoke with Dr. Marlou Starks who agreed to evaluate the patient and continue care as consultant.  Dr. Marlou Starks requested that hospitalist physician contacted to admit patient for her medical needs.  16:57 spoke with hospitalist Dr. Roosevelt Locks who will see the patient for admission.    Patient is agreeable with this plan.  Final Clinical Impression(s) / ED Diagnoses Final diagnoses:  Right upper quadrant pain    Rx / DC Orders ED Discharge Orders    None       Loni Beckwith, PA-C 06/30/20 1842    Loni Beckwith, PA-C 06/30/20 1846    Pattricia Boss, MD 07/01/20 1452

## 2020-06-30 NOTE — Consult Note (Signed)
[facility] Medical Consultation  Donna Lawson [id number] DOB: 04-Aug-1945 DOA: 06/30/2020 PCP: Donald Prose, MD   Requesting physician: [facility] surgeon Date of consultation: 06/30/2020 Reason for consultation: Hypokalemia  Impression/Recommendations Active Problems:   Symptomatic cholelithiasis    1. Hypokalemia, secondary to hydrochlorothiazide.  Discontinue hydrochlorothiazide, will use amlodipine and as needed hydralazine to control blood pressure.  Expect if her potassium level normalized, can restart hydrochlorothiazide p.o. a potassium sparing diuresis. 2. HTN, as above 3. Acute symptomatic cholelithiasis, medically cleared for tomorrow's cholecystectomy under general anesthesia with acceptable risk.  Patient has no history of CAD or MI, no history of CHF, not on insulin, no history of stroke. 4. Elevated glucose, check A1c 5. Mild intermittent asthma, no acute concern. 6. Questionable PAF, recommend outpatient follow-up with cardiology for Holter versus loop recorder.  I will followup again tomorrow. Please contact me if I can be of assistance in the meanwhile. Thank you for this consultation.  Chief Complaint: RUQ pain  HPI:  75 year old female patient with past medical history of hypertension hyperlipidemia anemia, cholelithiasis, presented with recurrent RUQ pain, nausea.  Patient has had symptomatic gallstones problem has had frequent flareup.  Most recent flareup, patient came in ED on February 23, was found to have a question A. fib.  Patient was given pain meds hydration and sent home.  Follow-up with [facility]. fib clinic and repeat EKG showed sinus rhythm.  This time, RUQ pain started yesterday evening after dinner, cramping like, gradual getting worse and became constant overnight.  Denies any fever chills.  Nauseous but no vomiting.  EGD work-up showed potassium 2.6, creatinine 1.2, glucose 156, AST 13 ALT 9, total bilirubin 1.1. Review of Systems:  Point review system  done negative except those mentioned HPI.  Past Medical History:  Diagnosis Date  . Anxiety   . Arthritis   . Diverticula, colon 1999  . Heart murmur   . History of blood transfusion   . Hyperlipidemia   . Hypertension   . Osteoarthritis of knee    right   Past Surgical History:  Procedure Laterality Date  . ABDOMINAL SURGERY    . COLONOSCOPY    . NO PAST SURGERIES     Social History:  reports that she has quit smoking. She has never used smokeless tobacco. She reports that she does not drink alcohol and does not use drugs.  Allergies  Allergen Reactions  . Atorvastatin     Other reaction(s): myalgias  . Cefuroxime Axetil     Other reaction(s): not feel well  . Crestor [Rosuvastatin Calcium]     Other reaction(s): myalgias  . Lisinopril     Tongue swelling  . Oxycodone-Acetaminophen     Other reaction(s): feel weird   Family History  Problem Relation Age of Onset  . Arthritis Mother   . Arthritis Father   . Hypertension Other     Prior to Admission medications   Medication Sig Start Date End Date Taking? Authorizing Provider  acetaminophen (TYLENOL) 500 MG tablet Take 500 mg by mouth every 6 (six) hours as needed for moderate pain or headache.   Yes [provider]  albuterol (VENTOLIN HFA) 108 (90 Base) MCG/ACT inhaler Inhale 2 puffs into the lungs every 6 (six) hours as needed for wheezing or shortness of breath. 04/29/20  Yes Freddi Starr, MD  ALPRAZolam Duanne Moron) 0.5 MG tablet Take 1 tablet (0.5 mg total) at bedtime as needed by mouth for anxiety or sleep. 02/25/17  Yes Nche, Charlene Brooke, NP  amLODipine (NORVASC) 10 MG tablet Take 1 tablet (10 mg total) daily by mouth. 02/25/17  Yes Nche, Charlene Brooke, NP  hydrochlorothiazide (HYDRODIURIL) 25 MG tablet Take 1 tablet (25 mg total) daily by mouth. Patient taking differently: Take 12.5 mg by mouth daily. 02/25/17  Yes Nche, Charlene Brooke, NP  lisinopril-hydrochlorothiazide (PRINZIDE,ZESTORETIC) 20-25 MG  per tablet Take 1 tablet by mouth 2 (two) times daily. Take 1 by mouth daily 03/17/12 05/10/12  [provider]   Physical Exam: Blood pressure (!) 135/105, pulse 84, temperature 98.4 F (36.9 C), temperature source Oral, resp. rate 17, height '5\' 3"'$  (1.6 m), weight 71.7 kg, SpO2 99 %. Vitals:   06/30/20 1715 06/30/20 1740  BP: (!) 179/91 (!) 135/105  Pulse: 80 84  Resp: 13 17  Temp:  98.4 F (36.9 C)  SpO2: 96% 99%     General: In pain  Eyes: No jaundice  ENT: No rash  Neck: Supple no JVD  Cardiovascular: RRR, soft systolic murmur on heart base  Respiratory: no crackles no wheezing  Abdomen: Soft, RUQ tenderness, no rebound no guarding  Skin: No jaundice  Musculoskeletal: Normal ROM  Psychiatric: Calm  Neurologic: Nonfocal  Labs on Admission:  Basic Metabolic Panel: Recent Labs  Lab 06/30/20 1313 06/30/20 1435  NA 135  --   K 2.6*  --   CL 99  --   CO2 24  --   GLUCOSE 156*  --   BUN 9  --   CREATININE 1.24*  --   CALCIUM 9.5  --   MG  --  2.0   Liver Function Tests: Recent Labs  Lab 06/30/20 1313  AST 13*  ALT 9  ALKPHOS 85  BILITOT 1.1  PROT 7.3  ALBUMIN 3.6   Recent Labs  Lab 06/30/20 1313  LIPASE 29   No results for input(s): AMMONIA in the last 168 hours. CBC: Recent Labs  Lab 06/30/20 1313  WBC 7.1  HGB 12.0  HCT 37.6  MCV 73.9*  PLT 310   Cardiac Enzymes: No results for input(s): CKTOTAL, CKMB, CKMBINDEX, TROPONINI in the last 168 hours. BNP: Invalid input(s): POCBNP CBG: No results for input(s): GLUCAP in the last 168 hours.  Radiological Exams on Admission: US Abdomen Limited RUQ (LIVER/GB)  Result Date: 06/30/2020 CLINICAL DATA:  Chronic right upper quadrant pain. EXAM: ULTRASOUND ABDOMEN LIMITED RIGHT UPPER QUADRANT COMPARISON:  Right upper quadrant ultrasound 06/11/2020. FINDINGS: Gallbladder: Cholelithiasis measuring up to 1.3 cm with biliary sludge. No pericholecystic fluid or wall thickening visualized.  No sonographic Murphy sign noted by sonographer. Common bile duct: Diameter: 6 mm Liver: No solid focal lesion identified. 1.3 cm cyst in the left lobe of the liver. Within normal limits in parenchymal echogenicity. Portal vein is patent on color Doppler imaging with normal direction of blood flow towards the liver. Other: None. IMPRESSION: Cholelithiasis and biliary sludge. No sonographic evidence of acute cholecystitis. If clinical concern for chronic cholecystitis/biliary dyskinesia recommend further evaluation with nonemergent nuclear medicine HIDA scan. Electronically Signed   By: Dahlia Bailiff MD   On: 06/30/2020 16:42    EKG: Independently reviewed.  Sinus, no acute ST changes, some PVCs  Time spent: [facility] Pager [phone] 06/30/2020, 6:14 PM

## 2020-06-30 NOTE — Plan of Care (Signed)
  Problem: Health Behavior/Discharge Planning: Goal: Ability to manage health-related needs will improve Outcome: Progressing   

## 2020-06-30 NOTE — Anesthesia Preprocedure Evaluation (Addendum)
Anesthesia Evaluation  Patient identified by MRN, date of birth, ID band Patient awake    Reviewed: Allergy & Precautions, NPO status , Patient's Chart, lab work & pertinent test results  History of Anesthesia Complications (+) PONV and history of anesthetic complications  Airway Mallampati: II  TM Distance: >3 FB Neck ROM: Full    Dental  (+) Partial Upper, Partial Lower, Poor Dentition, Caps, Dental Advisory Given   Pulmonary former smoker,    Pulmonary exam normal breath sounds clear to auscultation       Cardiovascular hypertension, Pt. on medications + Valvular Problems/Murmurs  Rhythm:Regular Rate:Normal + Systolic murmurs    Neuro/Psych PSYCHIATRIC DISORDERS Anxiety negative neurological ROS     GI/Hepatic negative GI ROS, SYMPTOMATIC CHOLELITHASIS   Endo/Other  negative endocrine ROS  Renal/GU negative Renal ROS     Musculoskeletal  (+) Arthritis ,   Abdominal   Peds  Hematology  (+) Blood dyscrasia, anemia ,   Anesthesia Other Findings   Reproductive/Obstetrics                            Anesthesia Physical Anesthesia Plan  ASA: II  Anesthesia Plan: General   Post-op Pain Management:    Induction: Intravenous  PONV Risk Score and Plan: 4 or greater and Dexamethasone and Ondansetron  Airway Management Planned: Oral ETT  Additional Equipment: None  Intra-op Plan:   Post-operative Plan: Extubation in OR  Informed Consent: I have reviewed the patients History and Physical, chart, labs and discussed the procedure including the risks, benefits and alternatives for the proposed anesthesia with the patient or authorized representative who has indicated his/her understanding and acceptance.     Dental advisory given  Plan Discussed with: CRNA  Anesthesia Plan Comments:        Anesthesia Quick Evaluation

## 2020-06-30 NOTE — H&P (Signed)
Donna Lawson 10-Apr-1946  [id number].    Requesting MD: Loni Beckwith, PA-C Chief Complaint/Reason for Consult: Symptomatic cholelithiasis  HPI: Donna Lawson is a 75 y.o. female with a history of hypertension and hyperlipidemia who presented to the emergency department with upper abdominal pain and nausea.  Patient reports that yesterday she began having epigastric/right upper quadrant abdominal pain that was severe in nature, constant, with radiation to her back and associated nausea and emesis. She reports over the last several weeks she has been having frequent attacks similar to this that last for ~5-6 hours before resolving. Her symptoms are brought on or worsened after eating.  As a result of this she has had some weight loss over the last few weeks due to decreased oral intake.  She was seen in the ED on 2/23 and found to have gallstones. She was referred to general surgery but has not followed up with them yet.  During ED visit on 2/23 she was also found to have A. fib versus PACs.  She was seen in A. fib clinic on 3/1 and ECG at that time appeared to be SR.  They recommended follow-up after her "gallbladder issues are resolved".  Patient reports that her pain and nausea have resolved currently.  In the ED her WBC was 7.1.  LFTs without elevation.  T bili 1.1.  Lipase within normal limits.  No imaging performed. K 2.6.  Patient is not on blood thinners.  She notes prior ex lap and bowel resection after complication from colonoscopy.  ROS: Review of Systems  Constitutional: Positive for weight loss. Negative for chills and fever.  Respiratory: Positive for cough. Negative for shortness of breath.   Cardiovascular: Negative for chest pain and leg swelling.  Gastrointestinal: Positive for abdominal pain, nausea and vomiting. Negative for constipation (last bm today, passing flatus) and diarrhea.  Genitourinary: Negative for dysuria and frequency.  Psychiatric/Behavioral:  Negative for substance abuse.  All other systems reviewed and are negative.   Family History  Problem Relation Age of Onset  . Arthritis Mother   . Arthritis Father   . Hypertension Other     Past Medical History:  Diagnosis Date  . Anxiety   . Arthritis   . Diverticula, colon 1999  . Heart murmur   . History of blood transfusion   . Hyperlipidemia   . Hypertension   . Osteoarthritis of knee    right    Past Surgical History:  Procedure Laterality Date  . ABDOMINAL SURGERY    . COLONOSCOPY    . NO PAST SURGERIES      Social History:  reports that she has quit smoking. She has never used smokeless tobacco. She reports that she does not drink alcohol and does not use drugs. Patient is retired She reports she quit smoking 30 years ago She denies alcohol use She denies any illicit drug use  Allergies:  Allergies  Allergen Reactions  . Atorvastatin     Other reaction(s): myalgias  . Cefuroxime Axetil     Other reaction(s): not feel well  . Crestor [Rosuvastatin Calcium]     Other reaction(s): myalgias  . Lisinopril     Tongue swelling  . Oxycodone-Acetaminophen     Other reaction(s): feel weird    (Not in a hospital admission)    Physical Exam: Blood pressure (!) 182/83, pulse 92, temperature 98.2 F (36.8 C), temperature source Oral, resp. rate (!) 22, SpO2 97 %. General: pleasant, WD/WN white female who  is laying in bed in NAD HEENT: head is normocephalic, atraumatic.  Sclera are noninjected.  PERRL.  Ears and nose without any masses or lesions.  Mouth is pink and moist. Dentition fair Heart: regular, rate, and rhythm. No obvious murmurs, gallops, or rubs noted.  Palpable radial and pedal pulses bilaterally  Lungs: CTAB, no wheezes, rhonchi, or rales noted.  Respiratory effort nonlabored Abd: Soft, ND, minimal tenderness to the epigastric and ruq without peritonitis. Negative murphy's sign. +BS, no masses, hernias, or organomegaly MS: no BUE/BLE edema,  calves soft and nontender Skin: warm and dry with no masses, lesions, or rashes Psych: A&Ox4 with an appropriate affect Neuro: cranial nerves grossly intact, equal strength in BUE/BLE bilaterally, normal speech, thought process intact  Results for orders placed or performed during the hospital encounter of 06/30/20 (from the past 48 hour(s))  Lipase, blood     Status: None   Collection Time: 06/30/20  1:13 PM  Result Value Ref Range   Lipase 29 11 - 51 U/L    Comment: Performed at [facility], [place] [address], [place], [place] [address]  Comprehensive metabolic panel     Status: Abnormal   Collection Time: 06/30/20  1:13 PM  Result Value Ref Range   Sodium 135 135 - 145 mmol/L   Potassium 2.6 (LL) 3.5 - 5.1 mmol/L    Comment: CRITICAL RESULT CALLED TO, READ BACK BY AND VERIFIED WITH: Donna Banner, RN 1431 06/30/20 Donna Lawson    Chloride 99 98 - 111 mmol/L   CO2 24 22 - 32 mmol/L   Glucose, Bld 156 (H) 70 - 99 mg/dL    Comment: Glucose reference range applies only to samples taken after fasting for at least 8 hours.   BUN 9 8 - 23 mg/dL   Creatinine, Ser 1.24 (H) 0.44 - 1.00 mg/dL   Calcium 9.5 8.9 - 10.3 mg/dL   Total Protein 7.3 6.5 - 8.1 g/dL   Albumin 3.6 3.5 - 5.0 g/dL   AST 13 (L) 15 - 41 U/L   ALT 9 0 - 44 U/L   Alkaline Phosphatase 85 38 - 126 U/L   Total Bilirubin 1.1 0.3 - 1.2 mg/dL   GFR, Estimated 45 (L) >60 mL/min    Comment: (NOTE) Calculated using the CKD-EPI Creatinine Equation (2021)    Anion gap 12 5 - 15    Comment: Performed at [facility] [address] [place], [place] [address]  CBC     Status: Abnormal   Collection Time: 06/30/20  1:13 PM  Result Value Ref Range   WBC 7.1 4.0 - 10.5 K/uL   RBC 5.09 3.87 - 5.11 MIL/uL   Hemoglobin 12.0 12.0 - 15.0 g/dL   HCT 37.6 36.0 - 46.0 %   MCV 73.9 (L) 80.0 - 100.0 fL   MCH 23.6 (L) 26.0 - 34.0 pg   MCHC 31.9 30.0 - 36.0 g/dL   RDW 16.7 (H) 11.5 - 15.5 %   Platelets 310 150 - 400 K/uL   nRBC 0.0  0.0 - 0.2 %    Comment: Performed at [facility] [address], [place], [place] [address]  Magnesium     Status: None   Collection Time: 06/30/20  2:35 PM  Result Value Ref Range   Magnesium 2.0 1.7 - 2.4 mg/dL    Comment: Performed at [facility], [place] [address], [place], [place] [address]   No results found.    Assessment/Plan  HTN - home meds  HLD  Hypokalemia - received 25mq oral and 3 runs of potassium in ED. Continue IVF with K additive. Check labs in am. Cardiac monitoring.   Symptomatic Cholelithiasis -Patient with several week history of right upper quadrant/epigastric abdominal pain with radiation to her back and associated nausea that is brought on/worsened after eating.  She has a ultrasound on 2/23 that showed cholelithiasis and a small amount of gallbladder sludge.  She is currently afebrile, with normal WBC and LFTs are non-elevated.  She has minimal tenderness on exam.  Low suspicion for acute cholecystitis but given frequency of symptoms would benefit from laparoscopic cholecystectomy.  Dr. TMarlou Starksdiscussed procedure, risks and benefits with the patient and she agrees to proceed with this.  Will admit to observation and plan for OR in the [place].  FEN - CLD, NPO at midnight, IVF VTE - SCDs, Lovenox ID - Cipro on call to OR.   Donna Lawson [facility] 06/30/2020, 4:23 PM Please see Amion for pager number during day hours 7:00am-4:30pm

## 2020-07-01 ENCOUNTER — Observation Stay (HOSPITAL_COMMUNITY): Payer: Medicare HMO

## 2020-07-01 ENCOUNTER — Observation Stay (HOSPITAL_COMMUNITY): Payer: Medicare HMO | Admitting: Anesthesiology

## 2020-07-01 ENCOUNTER — Encounter (HOSPITAL_COMMUNITY)
Admission: EM | Disposition: A | Discharge status: Home / Self Care | Payer: Self-pay | Source: Home / Self Care | Attending: Emergency Medicine

## 2020-07-01 ENCOUNTER — Encounter (HOSPITAL_COMMUNITY): Payer: Self-pay

## 2020-07-01 DIAGNOSIS — Z48815 Encounter for surgical aftercare following surgery on the digestive system: Secondary | ICD-10-CM | POA: Diagnosis not present

## 2020-07-01 DIAGNOSIS — R69 Illness, unspecified: Secondary | ICD-10-CM | POA: Diagnosis not present

## 2020-07-01 DIAGNOSIS — K8020 Calculus of gallbladder without cholecystitis without obstruction: Secondary | ICD-10-CM

## 2020-07-01 DIAGNOSIS — K8012 Calculus of gallbladder with acute and chronic cholecystitis without obstruction: Secondary | ICD-10-CM | POA: Diagnosis not present

## 2020-07-01 DIAGNOSIS — Z20822 Contact with and (suspected) exposure to covid-19: Secondary | ICD-10-CM | POA: Diagnosis not present

## 2020-07-01 DIAGNOSIS — K8066 Calculus of gallbladder and bile duct with acute and chronic cholecystitis without obstruction: Secondary | ICD-10-CM | POA: Diagnosis not present

## 2020-07-01 DIAGNOSIS — R1011 Right upper quadrant pain: Secondary | ICD-10-CM | POA: Diagnosis not present

## 2020-07-01 DIAGNOSIS — R112 Nausea with vomiting, unspecified: Secondary | ICD-10-CM | POA: Diagnosis not present

## 2020-07-01 DIAGNOSIS — I10 Essential (primary) hypertension: Secondary | ICD-10-CM | POA: Diagnosis not present

## 2020-07-01 DIAGNOSIS — E876 Hypokalemia: Secondary | ICD-10-CM | POA: Diagnosis not present

## 2020-07-01 DIAGNOSIS — K8013 Calculus of gallbladder with acute and chronic cholecystitis with obstruction: Secondary | ICD-10-CM | POA: Diagnosis not present

## 2020-07-01 DIAGNOSIS — Z79899 Other long term (current) drug therapy: Secondary | ICD-10-CM | POA: Diagnosis not present

## 2020-07-01 DIAGNOSIS — R011 Cardiac murmur, unspecified: Secondary | ICD-10-CM | POA: Diagnosis not present

## 2020-07-01 DIAGNOSIS — E785 Hyperlipidemia, unspecified: Secondary | ICD-10-CM | POA: Diagnosis not present

## 2020-07-01 HISTORY — PX: CHOLECYSTECTOMY: SHX55

## 2020-07-01 HISTORY — PX: INTRAOPERATIVE CHOLANGIOGRAM: SHX5230

## 2020-07-01 LAB — GLUCOSE, CAPILLARY
Glucose-Capillary: 129 mg/dL — ABNORMAL HIGH (ref 70–99)
Glucose-Capillary: 202 mg/dL — ABNORMAL HIGH (ref 70–99)
Glucose-Capillary: 209 mg/dL — ABNORMAL HIGH (ref 70–99)

## 2020-07-01 LAB — SURGICAL PCR SCREEN
MRSA, PCR: NEGATIVE
Staphylococcus aureus: POSITIVE — AB

## 2020-07-01 SURGERY — LAPAROSCOPIC CHOLECYSTECTOMY WITH INTRAOPERATIVE CHOLANGIOGRAM
Anesthesia: General | Site: Abdomen

## 2020-07-01 MED ORDER — HYDROMORPHONE HCL 1 MG/ML IJ SOLN: 0.2500 mg | INTRAMUSCULAR | Status: DC | PRN

## 2020-07-01 MED ORDER — MORPHINE SULFATE (PF) 2 MG/ML IV SOLN: 2.0000 mg | INTRAVENOUS | Status: DC | PRN

## 2020-07-01 MED ORDER — SUCCINYLCHOLINE CHLORIDE 200 MG/10ML IV SOSY
PREFILLED_SYRINGE | INTRAVENOUS | Status: AC
  Filled 2020-07-01: qty 20

## 2020-07-01 MED ORDER — DEXAMETHASONE SODIUM PHOSPHATE 10 MG/ML IJ SOLN
INTRAMUSCULAR | Status: DC | PRN
  Administered 2020-07-01: 10 mg via INTRAVENOUS

## 2020-07-01 MED ORDER — SUGAMMADEX SODIUM 200 MG/2ML IV SOLN
INTRAVENOUS | Status: DC | PRN
  Administered 2020-07-01: 150 mg via INTRAVENOUS

## 2020-07-01 MED ORDER — AMISULPRIDE (ANTIEMETIC) 5 MG/2ML IV SOLN
10.0000 mg | Freq: Once | INTRAVENOUS | Status: AC | PRN
  Administered 2020-07-01: 10 mg via INTRAVENOUS

## 2020-07-01 MED ORDER — EPHEDRINE 5 MG/ML INJ
INTRAVENOUS | Status: AC
  Filled 2020-07-01: qty 30

## 2020-07-01 MED ORDER — STERILE WATER FOR IRRIGATION IR SOLN
Status: DC | PRN
  Administered 2020-07-01: 1000 mL

## 2020-07-01 MED ORDER — LIDOCAINE 2% (20 MG/ML) 5 ML SYRINGE
INTRAMUSCULAR | Status: DC | PRN
  Administered 2020-07-01: 70 mg via INTRAVENOUS

## 2020-07-01 MED ORDER — SODIUM CHLORIDE 0.9 % IV SOLN
12.5000 mg | Freq: Four times a day (QID) | INTRAVENOUS | Status: DC | PRN
  Administered 2020-07-01: 12.5 mg via INTRAVENOUS
  Filled 2020-07-01 (×2): qty 0.5

## 2020-07-01 MED ORDER — CIPROFLOXACIN IN D5W 400 MG/200ML IV SOLN
400.0000 mg | Freq: Two times a day (BID) | INTRAVENOUS | Status: AC
  Administered 2020-07-01: 400 mg via INTRAVENOUS
  Filled 2020-07-01 (×2): qty 200

## 2020-07-01 MED ORDER — AMISULPRIDE (ANTIEMETIC) 5 MG/2ML IV SOLN
INTRAVENOUS | Status: AC
  Filled 2020-07-01: qty 4

## 2020-07-01 MED ORDER — HYDROMORPHONE HCL 1 MG/ML IJ SOLN
INTRAMUSCULAR | Status: AC
  Administered 2020-07-01: 0.5 mg via INTRAVENOUS
  Filled 2020-07-01: qty 1

## 2020-07-01 MED ORDER — LACTATED RINGERS IV SOLN: INTRAVENOUS | Status: DC | PRN

## 2020-07-01 MED ORDER — BUPIVACAINE HCL 0.25 % IJ SOLN
INTRAMUSCULAR | Status: DC | PRN
  Administered 2020-07-01: 16 mL

## 2020-07-01 MED ORDER — EPHEDRINE SULFATE-NACL 50-0.9 MG/10ML-% IV SOSY
PREFILLED_SYRINGE | INTRAVENOUS | Status: DC | PRN
  Administered 2020-07-01 (×2): 5 mg via INTRAVENOUS

## 2020-07-01 MED ORDER — PROPOFOL 10 MG/ML IV BOLUS
INTRAVENOUS | Status: DC | PRN
  Administered 2020-07-01: 100 mg via INTRAVENOUS

## 2020-07-01 MED ORDER — PHENYLEPHRINE 40 MCG/ML (10ML) SYRINGE FOR IV PUSH (FOR BLOOD PRESSURE SUPPORT)
PREFILLED_SYRINGE | INTRAVENOUS | Status: DC | PRN
  Administered 2020-07-01: 80 ug via INTRAVENOUS

## 2020-07-01 MED ORDER — ROCURONIUM BROMIDE 100 MG/10ML IV SOLN
INTRAVENOUS | Status: DC | PRN
  Administered 2020-07-01: 40 mg via INTRAVENOUS

## 2020-07-01 MED ORDER — FENTANYL CITRATE (PF) 250 MCG/5ML IJ SOLN
INTRAMUSCULAR | Status: DC | PRN
  Administered 2020-07-01: 50 ug via INTRAVENOUS
  Administered 2020-07-01: 100 ug via INTRAVENOUS

## 2020-07-01 MED ORDER — 0.9 % SODIUM CHLORIDE (POUR BTL) OPTIME
TOPICAL | Status: DC | PRN
  Administered 2020-07-01: 1000 mL

## 2020-07-01 MED ORDER — FENTANYL CITRATE (PF) 250 MCG/5ML IJ SOLN
INTRAMUSCULAR | Status: AC
  Filled 2020-07-01: qty 5

## 2020-07-01 MED ORDER — PROPOFOL 10 MG/ML IV BOLUS
INTRAVENOUS | Status: AC
  Filled 2020-07-01: qty 20

## 2020-07-01 MED ORDER — MUPIROCIN 2 % EX OINT
1.0000 "application " | TOPICAL_OINTMENT | Freq: Two times a day (BID) | CUTANEOUS | Status: DC
  Administered 2020-07-01 – 2020-07-03 (×5): 1 via NASAL
  Filled 2020-07-01 (×2): qty 22

## 2020-07-01 MED ORDER — PHENYLEPHRINE HCL-NACL 10-0.9 MG/250ML-% IV SOLN: INTRAVENOUS | Status: DC | PRN

## 2020-07-01 MED ORDER — PHENYLEPHRINE 40 MCG/ML (10ML) SYRINGE FOR IV PUSH (FOR BLOOD PRESSURE SUPPORT)
PREFILLED_SYRINGE | INTRAVENOUS | Status: AC
  Filled 2020-07-01: qty 10

## 2020-07-01 MED ORDER — ROCURONIUM BROMIDE 10 MG/ML (PF) SYRINGE
PREFILLED_SYRINGE | INTRAVENOUS | Status: AC
  Filled 2020-07-01: qty 20

## 2020-07-01 MED ORDER — POTASSIUM CHLORIDE 10 MEQ/100ML IV SOLN
10.0000 meq | INTRAVENOUS | Status: AC
  Administered 2020-07-01 (×3): 10 meq via INTRAVENOUS
  Filled 2020-07-01: qty 100

## 2020-07-01 MED ORDER — ENOXAPARIN SODIUM 40 MG/0.4ML ~~LOC~~ SOLN
40.0000 mg | SUBCUTANEOUS | Status: DC
  Administered 2020-07-02 – 2020-07-03 (×2): 40 mg via SUBCUTANEOUS
  Filled 2020-07-01 (×2): qty 0.4

## 2020-07-01 MED ORDER — MEPERIDINE HCL 25 MG/ML IJ SOLN: 6.2500 mg | INTRAMUSCULAR | Status: DC | PRN

## 2020-07-01 MED ORDER — ONDANSETRON HCL 4 MG/2ML IJ SOLN
INTRAMUSCULAR | Status: DC | PRN
  Administered 2020-07-01: 4 mg via INTRAVENOUS

## 2020-07-01 MED ORDER — CHLORHEXIDINE GLUCONATE CLOTH 2 % EX PADS
6.0000 | MEDICATED_PAD | Freq: Every day | CUTANEOUS | Status: DC
  Administered 2020-07-02: 6 via TOPICAL

## 2020-07-01 MED ORDER — SODIUM CHLORIDE 0.9 % IV SOLN
INTRAVENOUS | Status: DC | PRN
  Administered 2020-07-01: 10 mL

## 2020-07-01 MED ORDER — SUCCINYLCHOLINE CHLORIDE 20 MG/ML IJ SOLN
INTRAMUSCULAR | Status: DC | PRN
  Administered 2020-07-01: 100 mg via INTRAVENOUS

## 2020-07-01 MED ORDER — BUPIVACAINE HCL (PF) 0.25 % IJ SOLN
INTRAMUSCULAR | Status: AC
  Filled 2020-07-01: qty 30

## 2020-07-01 MED ORDER — KCL IN DEXTROSE-NACL 20-5-0.9 MEQ/L-%-% IV SOLN
INTRAVENOUS | Status: DC
  Filled 2020-07-01: qty 1000

## 2020-07-01 MED ORDER — SODIUM CHLORIDE 0.9 % IR SOLN
Status: DC | PRN
  Administered 2020-07-01: 1000 mL

## 2020-07-01 SURGICAL SUPPLY — 36 items
APPLIER CLIP 5 13 M/L LIGAMAX5 (MISCELLANEOUS) ×2
BLADE CLIPPER SURG (BLADE) IMPLANT
CANISTER SUCT 3000ML PPV (MISCELLANEOUS) ×2 IMPLANT
CATH REDDICK CHOLANGI 4FR 50CM (CATHETERS) ×2 IMPLANT
CHLORAPREP W/TINT 26 (MISCELLANEOUS) ×2 IMPLANT
CLIP APPLIE 5 13 M/L LIGAMAX5 (MISCELLANEOUS) ×1 IMPLANT
COVER MAYO STAND STRL (DRAPES) ×2 IMPLANT
COVER SURGICAL LIGHT HANDLE (MISCELLANEOUS) ×4 IMPLANT
COVER WAND RF STERILE (DRAPES) ×2 IMPLANT
DERMABOND ADVANCED (GAUZE/BANDAGES/DRESSINGS) ×1
DERMABOND ADVANCED .7 DNX12 (GAUZE/BANDAGES/DRESSINGS) ×1 IMPLANT
DRAPE C-ARM 42X120 X-RAY (DRAPES) ×2 IMPLANT
ELECT REM PT RETURN 9FT ADLT (ELECTROSURGICAL) ×2
ELECTRODE REM PT RTRN 9FT ADLT (ELECTROSURGICAL) ×1 IMPLANT
GLOVE BIO SURGEON STRL SZ7.5 (GLOVE) ×2 IMPLANT
GOWN STRL REUS W/ TWL LRG LVL3 (GOWN DISPOSABLE) ×3 IMPLANT
GOWN STRL REUS W/TWL LRG LVL3 (GOWN DISPOSABLE) ×3
IV CATH 14GX2 1/4 (CATHETERS) ×2 IMPLANT
KIT BASIN OR (CUSTOM PROCEDURE TRAY) ×2 IMPLANT
KIT TURNOVER KIT B (KITS) ×2 IMPLANT
NS IRRIG 1000ML POUR BTL (IV SOLUTION) ×2 IMPLANT
PAD ARMBOARD 7.5X6 YLW CONV (MISCELLANEOUS) ×2 IMPLANT
POUCH SPECIMEN RETRIEVAL 10MM (ENDOMECHANICALS) ×2 IMPLANT
SCISSORS LAP 5X35 DISP (ENDOMECHANICALS) ×2 IMPLANT
SET IRRIG TUBING LAPAROSCOPIC (IRRIGATION / IRRIGATOR) ×2 IMPLANT
SET TUBE SMOKE EVAC HIGH FLOW (TUBING) ×2 IMPLANT
SLEEVE ENDOPATH XCEL 5M (ENDOMECHANICALS) ×6 IMPLANT
SPECIMEN JAR SMALL (MISCELLANEOUS) ×2 IMPLANT
SUT MNCRL AB 4-0 PS2 18 (SUTURE) ×2 IMPLANT
SUT VICRYL 0 UR6 27IN ABS (SUTURE) ×2 IMPLANT
TOWEL GREEN STERILE (TOWEL DISPOSABLE) ×2 IMPLANT
TOWEL GREEN STERILE FF (TOWEL DISPOSABLE) ×2 IMPLANT
TRAY LAPAROSCOPIC MC (CUSTOM PROCEDURE TRAY) ×2 IMPLANT
TROCAR XCEL BLUNT TIP 100MML (ENDOMECHANICALS) ×2 IMPLANT
TROCAR XCEL NON-BLD 5MMX100MML (ENDOMECHANICALS) ×2 IMPLANT
WATER STERILE IRR 1000ML POUR (IV SOLUTION) ×2 IMPLANT

## 2020-07-01 NOTE — Anesthesia Procedure Notes (Signed)
Procedure Name: Intubation Date/Time: 07/01/2020 10:33 AM Performed by: Janene Harvey, CRNA Pre-anesthesia Checklist: Patient identified, Emergency Drugs available, Suction available and Patient being monitored Patient Re-evaluated:Patient Re-evaluated prior to induction Oxygen Delivery Method: Circle System Utilized Preoxygenation: Pre-oxygenation with 100% oxygen Induction Type: IV induction Laryngoscope Size: Mac and 4 Grade View: Grade I Tube type: Oral Tube size: 7.0 mm Number of attempts: 1 Airway Equipment and Method: Stylet and Oral airway Placement Confirmation: ETT inserted through vocal cords under direct vision,  positive ETCO2 and breath sounds checked- equal and bilateral Secured at: 22 cm Tube secured with: Tape Dental Injury: Teeth and Oropharynx as per pre-operative assessment

## 2020-07-01 NOTE — Interval H&P Note (Signed)
History and Physical Interval Note:  07/01/2020 9:52 AM  Donna Lawson  has presented today for surgery, with the diagnosis of SYMPTOMATIC CHOLELITHASIS.  The various methods of treatment have been discussed with the patient and family. After consideration of risks, benefits and other options for treatment, the patient has consented to  Procedure(s) with comments: [place] CHOLANGIOGRAM (N/A) - ROOM 1 STARTING AT 07:30AM FOR 90 MIN as a surgical intervention.  The patient's history has been reviewed, patient examined, no change in status, stable for surgery.  I have reviewed the patient's chart and labs.  Questions were answered to the patient's satisfaction.     Autumn Messing III

## 2020-07-01 NOTE — Op Note (Signed)
06/30/2020 - 07/01/2020  11:48 AM  PATIENT:  Donna Lawson  75 y.o. female  PRE-OPERATIVE DIAGNOSIS:  SYMPTOMATIC CHOLELITHASIS  POST-OPERATIVE DIAGNOSIS:  SYMPTOMATIC CHOLELITHASIS  PROCEDURE:  Procedure(s): LAPAROSCOPIC CHOLECYSTECTOMY (N/A) INTRAOPERATIVE CHOLANGIOGRAM (N/A)  SURGEON:  Surgeon(s) and Role:    * Jovita Kussmaul, MD - Primary  PHYSICIAN ASSISTANT:   ASSISTANTS: Ewell Poe, RNFA   ANESTHESIA:   local and general  EBL:  30 mL   BLOOD ADMINISTERED:none  DRAINS: none   LOCAL MEDICATIONS USED:  MARCAINE     SPECIMEN:  Source of Specimen:  gallbladder  DISPOSITION OF SPECIMEN:  PATHOLOGY  COUNTS:  YES  TOURNIQUET:  * No tourniquets in log *  DICTATION: .Dragon Dictation   After informed consent was obtained the patient was brought to the operating room and placed in the supine position on the operating table.  After adequate induction of general anesthesia the patient's abdomen was prepped with ChloraPrep, allowed to dry, and draped in usual sterile manner.  An appropriate timeout was performed.  A site was chosen in the right upper quadrant to access the abdominal cavity.  This area was infiltrated with quarter percent Marcaine.  A small stab incision was made with a 15 blade knife.  A 5 mm Optiview port and camera were used to bluntly dissected the layers of the abdominal wall under direct vision until access was gained to the abdominal cavity.  The abdomen is then insufflated with carbon dioxide without difficulty.  The abdomen was inspected and there were significant omental adhesions to the midline of the anterior abdominal wall.  The right upper quadrant appeared to be free.  I was then able to place a 5 mm port just to the right of the umbilicus under direct vision as well as a second right upper quadrant 5 mm port and 5 mm epigastric port all under direct vision without difficulty.  The camera was then moved to the periumbilical port.  A blunt grasper  was placed through the lateralmost 5 mm port in the right upper quadrant and used to grasp the dome of the gallbladder and elevated anteriorly and superiorly.  The gallbladder appeared to be filled with stones and the wall was a little difficult to grab.  There appeared to be a larger stone impacted at the neck of the gallbladder that would not move.  I was able to retract some laterally on the body and neck of the gallbladder with another 5 mm port.  The peritoneal reflection of the gallbladder neck was then opened sharply with the electrocautery.  Blunt dissection was carried out in this area until the gallbladder neck cystic duct junction was readily identified and a good window was created.  A single clip was placed on the gallbladder neck.  A small ductotomy was made just below the clip.  A 14-gauge Angiocath was placed percutaneously through the anterior abdominal wall in the right upper quadrant.  A Reddick cholangiogram catheter was placed through the Angiocath and flushed.  The Reddick catheter was then placed within the cystic duct and anchored in place with a clip.  A cholangiogram was obtained that showed good length on the cystic duct stump as well as good emptying into the duodenum and no filling defects.  The anchoring clip and catheter were then removed from the patient.  The cystic duct proximally was controlled with 2 clips and then the duct was divided between the 2 sets of clips.  Posterior to this the cystic artery was  identified and again dissected bluntly in a circumferential manner until a good window was collated.  2 clips were placed proximally 1 distally and the artery and the artery was divided between the 2 sets of clips.  Next the laparoscopic hook cautery device was used to separate the gallbladder from the liver bed.  Prior to completely detaching the gallbladder from the liver bed the liver bed was inspected and several small bleeding points were coagulated with the electrocautery  until the area was completely hemostatic.  The gallbladder was then detached Cerasoli from the liver bed with the electrocautery without difficulty.  The epigastric 5 mm port was then switched out for the [facility] cannula.  A laparoscopic bag was inserted through the [facility] cannula and the gallbladder was placed within the bag and the bag was sealed.  The bag was then brought up through the epigastric port.  The gallbladder was so large that it would not initially fit through the opening.  While the gallbladder was in the bag I was able to open the wall the gallbladder and evacuate large amount of cloudy fluid.  Once this was accomplished I was then able to remove the gallbladder with the bag through the epigastric port.  The gallbladder was sent to pathology for further evaluation.  The fascial defect at the epigastric port was closed with a figure-of-eight 0 Vicryl stitch.  The abdomen was then inspected and the liver bed was nicely clean and hemostatic.  The abdomen is irrigated with copious amounts of saline until the effluent was clear.  The ports were then removed under direct vision and the gas was allowed to escape.  The skin incisions were all closed with interrupted 4-0 Monocryl subcuticular stitches.  Dermabond dressings were applied.  The patient tolerated the procedure well.  At the end of the case all needle sponge and instrument counts were correct.  The patient was then awakened and taken recovery in stable condition.  PLAN OF CARE: Admit for overnight observation  PATIENT DISPOSITION:  PACU - hemodynamically stable.   Delay start of Pharmacological VTE agent (>24hrs) due to surgical blood loss or risk of bleeding: no

## 2020-07-01 NOTE — Progress Notes (Signed)
Internal Medicine Clinic Attending ? ?Case discussed with Dr. Braswell  At the time of the visit.  We reviewed the resident?s history and exam and pertinent patient test results.  I agree with the assessment, diagnosis, and plan of care documented in the resident?s note.  ?

## 2020-07-01 NOTE — Transfer of Care (Signed)
Immediate Anesthesia Transfer of Care Note  Patient: Donna Lawson  Procedure(s) Performed: LAPAROSCOPIC CHOLECYSTECTOMY (N/A Abdomen) INTRAOPERATIVE CHOLANGIOGRAM (N/A Abdomen)  Patient Location: PACU  Anesthesia Type:General  Level of Consciousness: awake and alert   Airway & Oxygen Therapy: Patient Spontanous Breathing  Post-op Assessment: Report given to RN, Post -op Vital signs reviewed and stable and Patient moving all extremities X 4  Post vital signs: Reviewed and stable  Last Vitals:  Vitals Value Taken Time  BP    Temp 36.1 C 07/01/20 1215  Pulse 76 07/01/20 1215  Resp 25 07/01/20 1215  SpO2 95 % 07/01/20 1215  Vitals shown include unvalidated device data.  Last Pain:  Vitals:   07/01/20 0420  TempSrc: Oral  PainSc:          Complications: No complications documented.

## 2020-07-01 NOTE — Progress Notes (Signed)
PROGRESS NOTE    Donna Lawson   [id number]  DOB: 15-Apr-1946  PCP: Donald Prose, MD    DOA: 06/30/2020 LOS: 0   Brief Narrative   75 year old female patient with past medical history of hypertension hyperlipidemia anemia, cholelithiasis, presented with recurrent RUQ pain, nausea.  Patient has had symptomatic gallstones problem has had frequent flareup.  Most recent flareup, patient came in ED on February 23, was found to have a question A. fib.  Patient was given pain meds hydration and sent home.  Follow-up with A. fib clinic and repeat EKG showed sinus rhythm.  This time, RUQ pain started yesterday evening after dinner, cramping like, gradual getting worse and became constant overnight.  Denies any fever chills.  Nauseous but no vomiting.  EGD work-up showed potassium 2.6, creatinine 1.2, glucose 156, AST 13 ALT 9, total bilirubin 1.1.  Assessment & Plan   Active Problems:   Symptomatic cholelithiasis  Hypokalemia - being replaced in IV fluids and IVPB's.  Recheck with AM labs and replace as needed, goal K>=4  Essential hypertension - HCTZ was stopped due to  Hypokalemia.  Continue amlodipine.  Primary care follow up within a week of discharge.  Monitor BP at home and record, bring log to PCP follow up.  Acute symptomatic cholelithiasis - underwent chole today.  Mgmt per surgery.  Hyperglycemia - A1c pending  Mild intermittent asthma - stable without acute issues.  Monitor.  ?paroxysmal A-fib - report of this as outlined above.  Outpatient follow up for ambulatory heart monitor.    Patient BMI: Body mass index is 27.99 kg/m.   DVT prophylaxis: enoxaparin (LOVENOX) injection 40 mg Start: 07/02/20 0800 SCDs Start: 06/30/20 1624   Diet:  Diet Orders (From admission, onward)    Start     Ordered   07/01/20 1338  Diet regular Room service appropriate? Yes; Fluid consistency: Thin  Diet effective now       Question Answer Comment  Room service appropriate? Yes   Fluid  consistency: Thin      07/01/20 1337            Code Status: Full Code    Subjective 07/01/20    Pt seen shortly post-op today.  Having persistent nausea despite Zofran.  No other complaints.  Disposition Plan & Communication   Status and disposition is: per general surgery  Consults, Procedures, Significant Events   Consultants:   Hospitalist service  Surgery is primary  Procedures:   Lap chole  Antimicrobials:  Anti-infectives (From admission, onward)   Start     Dose/Rate Route Frequency Ordered Stop   07/01/20 1430  ciprofloxacin (CIPRO) IVPB 400 mg        400 mg 200 mL/hr over 60 Minutes Intravenous Every 12 hours 07/01/20 1337 07/01/20 1909   07/01/20 0600  ciprofloxacin (CIPRO) IVPB 400 mg        400 mg 200 mL/hr over 60 Minutes Intravenous On call to O.R. 06/30/20 1627 07/01/20 1105        Micro    Objective   Vitals:   07/01/20 1315 07/01/20 1341 07/01/20 1650 07/01/20 1942  BP: (!) 175/85 (!) 164/85 (!) 160/69 (!) 161/87  Pulse: 92 91 79 86  Resp: '15 16 14 18  '$ Temp:  (!) 97.5 F (36.4 C) (!) 97.5 F (36.4 C) 97.9 F (36.6 C)  TempSrc:  Oral    SpO2: 96% 96% 95% 97%  Weight:      Height:  Intake/Output Summary (Last 24 hours) at 07/01/2020 2140 Last data filed at 07/01/2020 1130 Gross per 24 hour  Intake 1370.87 ml  Output 30 ml  Net 1340.87 ml   Filed Weights   06/30/20 1740  Weight: 71.7 kg    Physical Exam:  General exam: awake, alert, no acute distress Respiratory system: CTAB, no wheezes, rales or rhonchi, normal respiratory effort. Cardiovascular system: normal S1/S2, RRR, no JVD, murmurs, rubs, gallops,no pedal edema.   Gastrointestinal system: soft, post-op tenderness, lap chole incisions clean intact Central nervous system: A&O x4. no gross focal neurologic deficits, normal speech   Labs   Data Reviewed: I have personally reviewed following labs and imaging studies  CBC: Recent Labs  Lab 06/30/20 1313  06/30/20 2220  WBC 7.1 7.1  HGB 12.0 11.1*  HCT 37.6 34.8*  MCV 73.9* 73.9*  PLT 310 [id number]   Basic Metabolic Panel: Recent Labs  Lab 06/30/20 1313 06/30/20 1435 06/30/20 2220  NA 135  --  135  K 2.6*  --  3.4*  CL 99  --  103  CO2 24  --  24  GLUCOSE 156*  --  145*  BUN 9  --  8  CREATININE 1.24*  --  0.96  CALCIUM 9.5  --  9.0  MG  --  2.0  --   PHOS  --   --  2.5   GFR: Estimated Creatinine Clearance: 47.3 mL/min (by C-G formula based on SCr of 0.96 mg/dL). Liver Function Tests: Recent Labs  Lab 06/30/20 1313 06/30/20 2220  AST 13* 13*  ALT 9 8  ALKPHOS 85 82  BILITOT 1.1 0.8  PROT 7.3 6.7  ALBUMIN 3.6 3.3*   Recent Labs  Lab 06/30/20 1313  LIPASE 29   No results for input(s): AMMONIA in the last 168 hours. Coagulation Profile: No results for input(s): INR, PROTIME in the last 168 hours. Cardiac Enzymes: No results for input(s): CKTOTAL, CKMB, CKMBINDEX, TROPONINI in the last 168 hours. BNP (last 3 results) No results for input(s): PROBNP in the last 8760 hours. HbA1C: Recent Labs    06/30/20 2220  HGBA1C 5.6   CBG: Recent Labs  Lab 06/30/20 2107 07/01/20 0812 07/01/20 1647 07/01/20 2057  GLUCAP 98 129* 202* 209*   Lipid Profile: No results for input(s): CHOL, HDL, LDLCALC, TRIG, CHOLHDL, LDLDIRECT in the last 72 hours. Thyroid Function Tests: No results for input(s): TSH, T4TOTAL, FREET4, T3FREE, THYROIDAB in the last 72 hours. Anemia Panel: No results for input(s): VITAMINB12, FOLATE, FERRITIN, TIBC, IRON, RETICCTPCT in the last 72 hours. Sepsis Labs: No results for input(s): PROCALCITON, LATICACIDVEN in the last 168 hours.  Recent Results (from the past 240 hour(s))  Resp Panel by RT-PCR (Flu A&B, Covid) Nasopharyngeal Swab     Status: None   Collection Time: 06/30/20  5:15 PM   Specimen: Nasopharyngeal Swab; Nasopharyngeal(NP) swabs in vial transport medium  Result Value Ref Range Status   SARS Coronavirus 2 by RT PCR NEGATIVE  NEGATIVE Final    Comment: (NOTE) SARS-CoV-2 target nucleic acids are NOT DETECTED.  The SARS-CoV-2 RNA is generally detectable in upper respiratory specimens during the acute phase of infection. The lowest concentration of SARS-CoV-2 viral copies this assay can detect is 138 copies/mL. A negative result does not preclude SARS-Cov-2 infection and should not be used as the sole basis for treatment or other patient management decisions. A negative result may occur with  improper specimen collection/handling, submission of specimen other than nasopharyngeal swab, presence of  viral mutation(s) within the areas targeted by this assay, and inadequate number of viral copies(<138 copies/mL). A negative result must be combined with clinical observations, patient history, and epidemiological information. The expected result is Negative.  Fact Sheet for Patients:  EntrepreneurPulse.com.au  Fact Sheet for Healthcare Providers:  IncredibleEmployment.be  This test is no t yet approved or cleared by the [place] FDA and  has been authorized for detection and/or diagnosis of SARS-CoV-2 by FDA under an Emergency Use Authorization (EUA). This EUA will remain  in effect (meaning this test can be used) for the duration of the COVID-19 declaration under Section 564(b)(1) of the Act, 21 U.S.C.section 360bbb-3(b)(1), unless the authorization is terminated  or revoked sooner.       Influenza A by PCR NEGATIVE NEGATIVE Final   Influenza B by PCR NEGATIVE NEGATIVE Final    Comment: (NOTE) The Xpert Xpress SARS-CoV-2/FLU/RSV plus assay is intended as an aid in the diagnosis of influenza from Nasopharyngeal swab specimens and should not be used as a sole basis for treatment. Nasal washings and aspirates are unacceptable for Xpert Xpress SARS-CoV-2/FLU/RSV testing.  Fact Sheet for Patients: EntrepreneurPulse.com.au  Fact Sheet for Healthcare  Providers: IncredibleEmployment.be  This test is not yet approved or cleared by the [place] FDA and has been authorized for detection and/or diagnosis of SARS-CoV-2 by FDA under an Emergency Use Authorization (EUA). This EUA will remain in effect (meaning this test can be used) for the duration of the COVID-19 declaration under Section 564(b)(1) of the Act, 21 U.S.C. section 360bbb-3(b)(1), unless the authorization is terminated or revoked.  Performed at [facility], [place] [address], [place], [place] [address]   Surgical pcr screen     Status: Abnormal   Collection Time: 06/30/20  9:54 PM   Specimen: Nasal Mucosa; Nasal Swab  Result Value Ref Range Status   MRSA, PCR NEGATIVE NEGATIVE Final   Staphylococcus aureus POSITIVE (A) NEGATIVE Final    Comment: (NOTE) The Xpert SA Assay (FDA approved for NASAL specimens in patients 42 years of age and older), is one component of a comprehensive surveillance program. It is not intended to diagnose infection nor to guide or monitor treatment. Performed at [facility], [place] [address], [place], [place] [address]       Imaging Studies   DG Cholangiogram Operative  Result Date: 07/01/2020 CLINICAL DATA:  75 year old female with cholelithiasis EXAM: INTRAOPERATIVE CHOLANGIOGRAM TECHNIQUE: Cholangiographic images from the C-arm fluoroscopic device were submitted for interpretation post-operatively. Please see the procedural report for the amount of contrast and the fluoroscopy time utilized. COMPARISON:  None. FINDINGS: Surgical instruments project over the upper abdomen. There is cannulation of the cystic duct/gallbladder neck, with antegrade infusion of contrast. Caliber of the extrahepatic ductal system within normal limits. No definite filling defect within the extrahepatic ducts identified. Free flow of contrast across the ampulla. IMPRESSION: Intraoperative cholangiogram demonstrates extrahepatic biliary  ducts of unremarkable caliber, with no definite filling defects identified. Free flow of contrast across the ampulla. Please refer to the dictated operative report for full details of intraoperative findings and procedure Electronically Signed   By: Corrie Mckusick D.O.   On: 07/01/2020 12:13   US Abdomen Limited RUQ (LIVER/GB)  Result Date: 06/30/2020 CLINICAL DATA:  Chronic right upper quadrant pain. EXAM: ULTRASOUND ABDOMEN LIMITED RIGHT UPPER QUADRANT COMPARISON:  Right upper quadrant ultrasound 06/11/2020. FINDINGS: Gallbladder: Cholelithiasis measuring up to 1.3 cm with biliary sludge. No pericholecystic fluid or wall thickening visualized. No sonographic  Murphy sign noted by sonographer. Common bile duct: Diameter: 6 mm Liver: No solid focal lesion identified. 1.3 cm cyst in the left lobe of the liver. Within normal limits in parenchymal echogenicity. Portal vein is patent on color Doppler imaging with normal direction of blood flow towards the liver. Other: None. IMPRESSION: Cholelithiasis and biliary sludge. No sonographic evidence of acute cholecystitis. If clinical concern for chronic cholecystitis/biliary dyskinesia recommend further evaluation with nonemergent nuclear medicine HIDA scan. Electronically Signed   By: Dahlia Bailiff MD   On: 06/30/2020 16:42     Medications   Scheduled Meds: . amisulpride      . amLODipine  10 mg Oral Daily  . Chlorhexidine Gluconate Cloth  6 each Topical Daily  . docusate sodium  100 mg Oral BID  . [START ON 07/02/2020] enoxaparin (LOVENOX) injection  40 mg Subcutaneous Q24H  . insulin aspart  0-9 Units Subcutaneous TID WC  . loratadine  10 mg Oral Daily  . mupirocin ointment  1 application Nasal BID   Continuous Infusions: . dextrose 5 % and 0.9 % NaCl with KCl 20 mEq/L 50 mL/hr at 07/01/20 1751  . promethazine (PHENERGAN) injection 12.5 mg (07/01/20 1906)       LOS: 0 days    Time spent: 20 minutes    Ezekiel Slocumb, DO [facility]  07/01/2020, 9:40 PM      If 7PM-7AM, please contact night-coverage. How to contact the [facility] Attending or Consulting provider [place] or covering provider during after hours [place], for this patient?    1. Check the care team in [facility] and look for a) attending/consulting [facility] provider listed and b) the [facility] team listed 2. Log into [email or web address] and use [place]'s universal password to access. If you do not have the password, please contact the hospital operator. 3. Locate the [facility] provider you are looking for under [facility] and page to a number that you can be directly reached. 4. If you still have difficulty reaching the provider, please page the [facility] (Director on Call) for the Hospitalists listed on amion for assistance.

## 2020-07-02 ENCOUNTER — Encounter (HOSPITAL_COMMUNITY): Payer: Self-pay | Admitting: General Surgery

## 2020-07-02 DIAGNOSIS — E876 Hypokalemia: Secondary | ICD-10-CM | POA: Diagnosis not present

## 2020-07-02 DIAGNOSIS — K8020 Calculus of gallbladder without cholecystitis without obstruction: Secondary | ICD-10-CM | POA: Diagnosis not present

## 2020-07-02 LAB — URINE CULTURE

## 2020-07-02 LAB — CBC
HCT: 33.7 % — ABNORMAL LOW (ref 36.0–46.0)
Hemoglobin: 11 g/dL — ABNORMAL LOW (ref 12.0–15.0)
MCH: 23.9 pg — ABNORMAL LOW (ref 26.0–34.0)
MCHC: 32.6 g/dL (ref 30.0–36.0)
MCV: 73.3 fL — ABNORMAL LOW (ref 80.0–100.0)
Platelets: 262 10*3/uL (ref 150–400)
RBC: 4.6 MIL/uL (ref 3.87–5.11)
RDW: 16.5 % — ABNORMAL HIGH (ref 11.5–15.5)
WBC: 6.8 10*3/uL (ref 4.0–10.5)
nRBC: 0 % (ref 0.0–0.2)

## 2020-07-02 LAB — BASIC METABOLIC PANEL
Anion gap: 7 (ref 5–15)
BUN: 6 mg/dL — ABNORMAL LOW (ref 8–23)
CO2: 23 mmol/L (ref 22–32)
Calcium: 9.6 mg/dL (ref 8.9–10.3)
Chloride: 105 mmol/L (ref 98–111)
Creatinine, Ser: 0.95 mg/dL (ref 0.44–1.00)
GFR, Estimated: >60 mL/min (ref >60)
Glucose, Bld: 140 mg/dL — ABNORMAL HIGH (ref 70–99)
Potassium: 4.6 mmol/L (ref 3.5–5.1)
Sodium: 135 mmol/L (ref 135–145)

## 2020-07-02 LAB — COMPREHENSIVE METABOLIC PANEL
ALT: 18 U/L (ref 0–44)
AST: 36 U/L (ref 15–41)
Albumin: 3.4 g/dL — ABNORMAL LOW (ref 3.5–5.0)
Alkaline Phosphatase: 85 U/L (ref 38–126)
Anion gap: 10 (ref 5–15)
BUN: 7 mg/dL — ABNORMAL LOW (ref 8–23)
CO2: 22 mmol/L (ref 22–32)
Calcium: 9.5 mg/dL (ref 8.9–10.3)
Chloride: 103 mmol/L (ref 98–111)
Creatinine, Ser: 0.96 mg/dL (ref 0.44–1.00)
GFR, Estimated: >60 mL/min (ref >60)
Glucose, Bld: 138 mg/dL — ABNORMAL HIGH (ref 70–99)
Potassium: 4.5 mmol/L (ref 3.5–5.1)
Sodium: 135 mmol/L (ref 135–145)
Total Bilirubin: 0.8 mg/dL (ref 0.3–1.2)
Total Protein: 6.7 g/dL (ref 6.5–8.1)

## 2020-07-02 LAB — SURGICAL PATHOLOGY

## 2020-07-02 LAB — MAGNESIUM: Magnesium: 2 mg/dL (ref 1.7–2.4)

## 2020-07-02 LAB — GLUCOSE, CAPILLARY
Glucose-Capillary: 134 mg/dL — ABNORMAL HIGH (ref 70–99)
Glucose-Capillary: 278 mg/dL — ABNORMAL HIGH (ref 70–99)
Glucose-Capillary: 102 mg/dL — ABNORMAL HIGH (ref 70–99)
Glucose-Capillary: 110 mg/dL — ABNORMAL HIGH (ref 70–99)

## 2020-07-02 MED ORDER — POTASSIUM CHLORIDE 20 MEQ PO PACK: 20.0000 meq | PACK | Freq: Every day | ORAL | 0 refills | Status: DC

## 2020-07-02 NOTE — Progress Notes (Signed)
Informed that patient is an [facility]MT[facility] clinic patient. [facility] was consulted for medical management 3/14. [facility] will take over medical management on 3/17.

## 2020-07-02 NOTE — Progress Notes (Signed)
PROGRESS NOTE    Donna Lawson  [id number] DOB: 02/15/1946 DOA: 06/30/2020 PCP: Donald Prose, MD    Chief Complaint  Patient presents with  . Nausea  . Flank Pain    Brief Narrative:  Patient admitted to general surgery with symptomatic gallstones, postop day 1 Medicine consulted for medical management  Patient is teaching service patient, teaching service will see patient tomorrow  Subjective:  POD#1, continue to c/o pain, reports less nausea  Assessment & Plan:   Active Problems:   Symptomatic cholelithiasis   Symptomatic cholelithiasis -Status post lap chole with IOC on March 15 -Management per general surgery  Hypokalemia Likely due to HCTZ and poor oral intake Hold HCTZ , encourage oral intake  K replaced Mag 2.0  Impaired fasting blood glucose Likely due to stress A1c 5.6 Follow-up with PCP  Recently diagnosed with A. Fib Just seen by A. fib clinic Cardiology plan for outpatient heart monitoring after gallbladder addressed Follow-up with cardiology A. fib clinic     Unresulted Labs (From admission, onward)          Start     Ordered   Signed and Held  Comprehensive metabolic panel  Tomorrow morning,   R        Signed and Held            DVT prophylaxis: enoxaparin (LOVENOX) injection 40 mg Start: 07/02/20 0800 SCDs Start: 06/30/20 1624   Code Status: Full Family Communication: Patient Disposition:   Status is: Observation  Dispo: The patient is from: Home              Anticipated d/c is to: Home              Anticipated d/c date is: Likely tomorrow                Consultants:   General surgery as primary  Medicine team as consultant  Procedures:   Lap chole with IOC oon 3/15  Antimicrobials:   Cipro perioperative     Objective: Vitals:   07/01/20 1650 07/01/20 1942 07/02/20 0500 07/02/20 1536  BP: (!) 160/69 (!) 161/87 (!) 166/89 (!) 169/83  Pulse: 79 86 80 79  Resp: '14 18 17 17  '$ Temp: (!) 97.5 F (36.4  C) 97.9 F (36.6 C) 97.7 F (36.5 C) 97.7 F (36.5 C)  TempSrc:   Oral Oral  SpO2: 95% 97% 98% 96%  Weight:      Height:        Intake/Output Summary (Last 24 hours) at 07/02/2020 1912 Last data filed at 07/02/2020 1800 Gross per 24 hour  Intake 2452.56 ml  Output 1300 ml  Net 1152.56 ml   Filed Weights   06/30/20 1740  Weight: 71.7 kg    Examination:  General exam: calm, NAD Respiratory system: Clear to auscultation. Respiratory effort normal. Cardiovascular system: S1 & S2 heard, RRR. No JVD, no murmur, No pedal edema. Gastrointestinal system: Postop changes , mild tender, she is reluctant to take deep breath due to pain. . Normal bowel sounds heard. Central nervous system: Alert and oriented. No focal neurological deficits. Extremities: Symmetric 5 x 5 power. Skin: No rashes, lesions or ulcers Psychiatry: Judgement and insight appear normal. Mood & affect appropriate.     Data Reviewed: I have personally reviewed following labs and imaging studies  CBC: Recent Labs  Lab 06/30/20 1313 06/30/20 2220 07/02/20 0244  WBC 7.1 7.1 6.8  HGB 12.0 11.1* 11.0*  HCT 37.6 34.8* 33.7*  MCV  73.9* 73.9* 73.3*  PLT 310 266 [id number]    Basic Metabolic Panel: Recent Labs  Lab 06/30/20 1313 06/30/20 1435 06/30/20 2220 07/02/20 0244  NA 135  --  135 135  135  K 2.6*  --  3.4* 4.5  4.6  CL 99  --  103 103  105  CO2 24  --  '24 22  23  '$ GLUCOSE 156*  --  145* 138*  140*  BUN 9  --  8 7*  6*  CREATININE 1.24*  --  0.96 0.96  0.95  CALCIUM 9.5  --  9.0 9.5  9.6  MG  --  2.0  --  2.0  PHOS  --   --  2.5  --     GFR: Estimated Creatinine Clearance: 47.8 mL/min (by C-G formula based on SCr of 0.95 mg/dL).  Liver Function Tests: Recent Labs  Lab 06/30/20 1313 06/30/20 2220 07/02/20 0244  AST 13* 13* 36  ALT '9 8 18  '$ ALKPHOS 85 82 85  BILITOT 1.1 0.8 0.8  PROT 7.3 6.7 6.7  ALBUMIN 3.6 3.3* 3.4*    CBG: Recent Labs  Lab 07/01/20 1647 07/01/20 2057  07/02/20 0754 07/02/20 1243 07/02/20 1703  GLUCAP 202* 209* 134* 278* 102*     Recent Results (from the past 240 hour(s))  Resp Panel by RT-PCR (Flu A&B, Covid) Nasopharyngeal Swab     Status: None   Collection Time: 06/30/20  5:15 PM   Specimen: Nasopharyngeal Swab; Nasopharyngeal(NP) swabs in vial transport medium  Result Value Ref Range Status   SARS Coronavirus 2 by RT PCR NEGATIVE NEGATIVE Final    Comment: (NOTE) SARS-CoV-2 target nucleic acids are NOT DETECTED.  The SARS-CoV-2 RNA is generally detectable in upper respiratory specimens during the acute phase of infection. The lowest concentration of SARS-CoV-2 viral copies this assay can detect is 138 copies/mL. A negative result does not preclude SARS-Cov-2 infection and should not be used as the sole basis for treatment or other patient management decisions. A negative result may occur with  improper specimen collection/handling, submission of specimen other than nasopharyngeal swab, presence of viral mutation(s) within the areas targeted by this assay, and inadequate number of viral copies(<138 copies/mL). A negative result must be combined with clinical observations, patient history, and epidemiological information. The expected result is Negative.  Fact Sheet for Patients:  EntrepreneurPulse.com.au  Fact Sheet for Healthcare Providers:  IncredibleEmployment.be  This test is no t yet approved or cleared by the [place] FDA and  has been authorized for detection and/or diagnosis of SARS-CoV-2 by FDA under an Emergency Use Authorization (EUA). This EUA will remain  in effect (meaning this test can be used) for the duration of the COVID-19 declaration under Section 564(b)(1) of the Act, 21 U.S.C.section 360bbb-3(b)(1), unless the authorization is terminated  or revoked sooner.       Influenza A by PCR NEGATIVE NEGATIVE Final   Influenza B by PCR NEGATIVE NEGATIVE Final     Comment: (NOTE) The Xpert Xpress SARS-CoV-2/FLU/RSV plus assay is intended as an aid in the diagnosis of influenza from Nasopharyngeal swab specimens and should not be used as a sole basis for treatment. Nasal washings and aspirates are unacceptable for Xpert Xpress SARS-CoV-2/FLU/RSV testing.  Fact Sheet for Patients: EntrepreneurPulse.com.au  Fact Sheet for Healthcare Providers: IncredibleEmployment.be  This test is not yet approved or cleared by the [place] FDA and has been authorized for detection and/or diagnosis of SARS-CoV-2 by FDA under an Emergency Use  Authorization (EUA). This EUA will remain in effect (meaning this test can be used) for the duration of the COVID-19 declaration under Section 564(b)(1) of the Act, 21 U.S.C. section 360bbb-3(b)(1), unless the authorization is terminated or revoked.  Performed at [facility], [place] [address], [place], [place] [address]   Urine culture     Status: Abnormal   Collection Time: 06/30/20  5:22 PM   Specimen: Urine, Random  Result Value Ref Range Status   Specimen Description URINE, RANDOM  Final   Special Requests   Final    NONE Performed at [facility], [place] [address], [place], [place] [address]    Culture MULTIPLE SPECIES PRESENT, SUGGEST RECOLLECTION (A)  Final   Report Status 07/02/2020 FINAL  Final  Surgical pcr screen     Status: Abnormal   Collection Time: 06/30/20  9:54 PM   Specimen: Nasal Mucosa; Nasal Swab  Result Value Ref Range Status   MRSA, PCR NEGATIVE NEGATIVE Final   Staphylococcus aureus POSITIVE (A) NEGATIVE Final    Comment: (NOTE) The Xpert SA Assay (FDA approved for NASAL specimens in patients 52 years of age and older), is one component of a comprehensive surveillance program. It is not intended to diagnose infection nor to guide or monitor treatment. Performed at [facility], [place] [address] [place], [place] [address]           Radiology Studies: DG Cholangiogram Operative  Result Date: 07/01/2020 CLINICAL DATA:  75 year old female with cholelithiasis EXAM: INTRAOPERATIVE CHOLANGIOGRAM TECHNIQUE: Cholangiographic images from the C-arm fluoroscopic device were submitted for interpretation post-operatively. Please see the procedural report for the amount of contrast and the fluoroscopy time utilized. COMPARISON:  None. FINDINGS: Surgical instruments project over the upper abdomen. There is cannulation of the cystic duct/gallbladder neck, with antegrade infusion of contrast. Caliber of the extrahepatic ductal system within normal limits. No definite filling defect within the extrahepatic ducts identified. Free flow of contrast across the ampulla. IMPRESSION: Intraoperative cholangiogram demonstrates extrahepatic biliary ducts of unremarkable caliber, with no definite filling defects identified. Free flow of contrast across the ampulla. Please refer to the dictated operative report for full details of intraoperative findings and procedure Electronically Signed   By: Corrie Mckusick D.O.   On: 07/01/2020 12:13        Scheduled Meds: . amLODipine  10 mg Oral Daily  . Chlorhexidine Gluconate Cloth  6 each Topical Daily  . docusate sodium  100 mg Oral BID  . enoxaparin (LOVENOX) injection  40 mg Subcutaneous Q24H  . insulin aspart  0-9 Units Subcutaneous TID WC  . loratadine  10 mg Oral Daily  . mupirocin ointment  1 application Nasal BID   Continuous Infusions: . promethazine (PHENERGAN) injection Stopped (07/01/20 2311)     LOS: 0 days   Time spent: 50mns Greater than 50% of this time was spent in counseling, explanation of diagnosis, planning of further management, and coordination of care.   Voice Recognition /Viviann Sparedictation system was used to create this note, attempts have been made to correct errors. Please contact the author with questions and/or clarifications.   FFlorencia Reasons MD PhD FACP [facility]  Available via Epic secure chat 7am-7pm for nonurgent issues Please page for urgent issues To page the attending provider between 7A-7P or the covering provider during after hours 7P-7A, please log into the web site [email or web address] and access using universal [place] password for that web site. If you do not have  the password, please call the hospital operator.    07/02/2020, 7:12 PM

## 2020-07-02 NOTE — Progress Notes (Signed)
1 Day Post-Op  Subjective: CC: Patient reports yesterday after returning from the OR she had persistent nausea as well as several episodes of emesis.  She reports that her nausea and emesis is resolved this morning.  She has been taking in sips of clear liquids.  She reports a 5/10 right upper quadrant abdominal pain and soreness around her incisions.  She just mobilized with PT.  Are recommending likely stay 1 more night for additional therapies in the morning.  Objective: Vital signs in last 24 hours: Temp:  [97 F (36.1 C)-97.9 F (36.6 C)] 97.7 F (36.5 C) (03/16 0500) Pulse Rate:  [73-92] 80 (03/16 0500) Resp:  [14-25] 17 (03/16 0500) BP: (148-175)/(69-89) 166/89 (03/16 0500) SpO2:  [93 %-98 %] 98 % (03/16 0500) Last BM Date: 06/30/20  Intake/Output from previous day: 03/15 0701 - 03/16 0700 In: 1682.6 [I.V.:1035.4; IV Piggyback:647.2] Out: 55 [Urine:800; Blood:30] Intake/Output this shift: Total I/O In: 240 [P.O.:240] Out: -   PE: Gen:  Alert, NAD, pleasant Card:  RRR Pulm:  CTAB, no W/R/R, effort normal Abd: Soft, ND, appropriately tender over laparoscopic incisions, +BS, Incisions with glue intact appears well and are without drainage, bleeding, or signs of infection Ext:  No LE edema  Psych: A&Ox3  Skin: no rashes noted, warm and dry   Lab Results:  Recent Labs    06/30/20 2220 07/02/20 0244  WBC 7.1 6.8  HGB 11.1* 11.0*  HCT 34.8* 33.7*  PLT 266 262   BMET Recent Labs    06/30/20 2220 07/02/20 0244  NA 135 135  135  K 3.4* 4.5  4.6  CL 103 103  105  CO2 '24 22  23  '$ GLUCOSE 145* 138*  140*  BUN 8 7*  6*  CREATININE 0.96 0.96  0.95  CALCIUM 9.0 9.5  9.6   PT/INR No results for input(s): LABPROT, INR in the last 72 hours. CMP     Component Value Date/Time   NA 135 07/02/2020 0244   NA 135 07/02/2020 0244   NA 138 06/18/2020 1038   K 4.6 07/02/2020 0244   K 4.5 07/02/2020 0244   CL 105 07/02/2020 0244   CL 103 07/02/2020 0244    CO2 23 07/02/2020 0244   CO2 22 07/02/2020 0244   GLUCOSE 140 (H) 07/02/2020 0244   GLUCOSE 138 (H) 07/02/2020 0244   BUN 6 (L) 07/02/2020 0244   BUN 7 (L) 07/02/2020 0244   BUN 10 06/18/2020 1038   CREATININE 0.95 07/02/2020 0244   CREATININE 0.96 07/02/2020 0244   CALCIUM 9.6 07/02/2020 0244   CALCIUM 9.5 07/02/2020 0244   PROT 6.7 07/02/2020 0244   ALBUMIN 3.4 (L) 07/02/2020 0244   AST 36 07/02/2020 0244   ALT 18 07/02/2020 0244   ALKPHOS 85 07/02/2020 0244   BILITOT 0.8 07/02/2020 0244   GFRNONAA >60 07/02/2020 0244   GFRNONAA >60 07/02/2020 0244   GFRAA >60 07/03/2019 1430   Lipase     Component Value Date/Time   LIPASE 29 06/30/2020 1313       Studies/Results: DG Cholangiogram Operative  Result Date: 07/01/2020 CLINICAL DATA:  75 year old female with cholelithiasis EXAM: INTRAOPERATIVE CHOLANGIOGRAM TECHNIQUE: Cholangiographic images from the C-arm fluoroscopic device were submitted for interpretation post-operatively. Please see the procedural report for the amount of contrast and the fluoroscopy time utilized. COMPARISON:  None. FINDINGS: Surgical instruments project over the upper abdomen. There is cannulation of the cystic duct/gallbladder neck, with antegrade infusion of contrast. Caliber of the  extrahepatic ductal system within normal limits. No definite filling defect within the extrahepatic ducts identified. Free flow of contrast across the ampulla. IMPRESSION: Intraoperative cholangiogram demonstrates extrahepatic biliary ducts of unremarkable caliber, with no definite filling defects identified. Free flow of contrast across the ampulla. Please refer to the dictated operative report for full details of intraoperative findings and procedure Electronically Signed   By: Corrie Mckusick D.O.   On: 07/01/2020 12:13   US Abdomen Limited RUQ (LIVER/GB)  Result Date: 06/30/2020 CLINICAL DATA:  Chronic right upper quadrant pain. EXAM: ULTRASOUND ABDOMEN LIMITED RIGHT UPPER  QUADRANT COMPARISON:  Right upper quadrant ultrasound 06/11/2020. FINDINGS: Gallbladder: Cholelithiasis measuring up to 1.3 cm with biliary sludge. No pericholecystic fluid or wall thickening visualized. No sonographic Murphy sign noted by sonographer. Common bile duct: Diameter: 6 mm Liver: No solid focal lesion identified. 1.3 cm cyst in the left lobe of the liver. Within normal limits in parenchymal echogenicity. Portal vein is patent on color Doppler imaging with normal direction of blood flow towards the liver. Other: None. IMPRESSION: Cholelithiasis and biliary sludge. No sonographic evidence of acute cholecystitis. If clinical concern for chronic cholecystitis/biliary dyskinesia recommend further evaluation with nonemergent nuclear medicine HIDA scan. Electronically Signed   By: Dahlia Bailiff MD   On: 06/30/2020 16:42    Anti-infectives: Anti-infectives (From admission, onward)   Start     Dose/Rate Route Frequency Ordered Stop   07/01/20 1430  ciprofloxacin (CIPRO) IVPB 400 mg        400 mg 200 mL/hr over 60 Minutes Intravenous Every 12 hours 07/01/20 1337 07/01/20 1911   07/01/20 0600  ciprofloxacin (CIPRO) IVPB 400 mg        400 mg 200 mL/hr over 60 Minutes Intravenous On call to O.R. 06/30/20 1627 07/01/20 1105       Assessment/Plan HTN - home meds  HLD  Hypokalemia - resolved   Symptomatic cholelithiasis S/p laparoscopic cholecystectomy with IOC-Dr. Toth-07/01/2020 - POD #1 - IOC negative. LFT's wnl. WBC wnl - Adv diet - Mobilize. Continue PT - Pulm toilet  FEN - Reg, d/c IVF when tolerating diet.  VTE - SCDs, Lovenox ID - Cipro periop. None currently.  Dispo - Plan to keep today for an additional day of PT and possible discharge tomorrow   LOS: 0 days    [name] , [facility] Surgery 07/02/2020, 9:14 AM Please see Amion for pager number during day hours 7:00am-4:30pm

## 2020-07-02 NOTE — Evaluation (Signed)
Physical Therapy Evaluation Patient Details Name: Donna Lawson MRN: [id number] DOB: [birth year] Today's Date: 07/02/2020   History of Present Illness  Pt is a 75 yo femal who presented tO ED with upper abdominal pain and nausea, found to have cholelithasis who then underwent a Lap chole on 3/15. PMH: HTN, hyperlipidemia     Clinical Impression  Pt admitted with above. Pt indep PTA and motivated to return home independently. Pt with unsteady gait with AD requiring RW for safety and truncal support to minimize R quadrant surgical pain during mobility. Pt does have a flight of stair to access bedroom which she would like to do but can stay on first floor if needed. Pt reports that she would be home alone during the day however she would ask if family could stay for the first few days. Pt educated on her lifting precautions and give incentive spirometer. Spoke with Legrand Como, [place] with surgery and recommended one more night stay as pt hasn't eaten anything, is unsteady on her feet and would benefit from another PT session prior to d/c home as pt most likely will be home alone during the day (patients desire). Acute PT to cont to follow.    Follow Up Recommendations No PT follow up;Supervision/Assistance - 24 hour (initially, for first couple of days)    Equipment Recommendations  Rolling walker with 5" wheels (youth)    Recommendations for Other Services       Precautions / Restrictions Precautions Precautions: Fall Precaution Comments: lifting precautions, < 15 pounds Restrictions Weight Bearing Restrictions: No Other Position/Activity Restrictions: no lifting more than 15 pounds      Mobility  Bed Mobility Overal bed mobility: Needs Assistance Bed Mobility: Rolling;Sidelying to Sit Rolling: Min assist Sidelying to sit: Mod assist       General bed mobility comments: minA to reach across body with R hand for edge of bed (pt with no bed rails at home), modA for trunk elevation from  sidelying due to onset of pain and first time getting up    Transfers Overall transfer level: Needs assistance Equipment used: None Transfers: Sit to/from Stand Sit to Stand: Min assist         General transfer comment: minA to steady upon standing, increased time, pt pushed up from bed/chair however holding hands out to brace self/reach for something  Ambulation/Gait Ambulation/Gait assistance: Min assist;Min guard Gait Distance (Feet): 10 Feet (x2 (to/from bathroom), 30x1 with RW, 30x1 without AD) Assistive device: Rolling walker (2 wheeled) Gait Pattern/deviations: Step-through pattern;Decreased stride length;Staggering left;Staggering right Gait velocity: dec Gait velocity interpretation: <1.31 ft/sec, indicative of household ambulator General Gait Details: when amb without AD pt reaching for objects to hold onto/brace self with increased surgical pain on R side and more difficulty with breathing requiring minA, when given walker pt with improved stability, no difficulty breathing, and experienced less pain, pt agreed  [employer] Mobility    Modified Rankin (Stroke Patients Only)       Balance Overall balance assessment: Needs assistance Sitting-balance support: Feet supported;No upper extremity supported Sitting balance-Leahy Scale: Fair     Standing balance support: Single extremity supported;During functional activity Standing balance-Leahy Scale: Fair Standing balance comment: pt stood at sink to wash face and perform pericare due to purwick leaking, pt required L UE on sink for support  Pertinent Vitals/Pain Pain Assessment: 0-10 Pain Score: 5  Pain Location: surgical site, R abdomen Pain Descriptors / Indicators: Discomfort;Sharp (sharp with coughing/laughing) Pain Intervention(s): Monitored during session    Home Living Family/patient expects to be discharged to:: Private residence Living  Arrangements: Alone Available Help at Discharge: Family;Available PRN/intermittently Type of Home: House Home Access: Level entry     Home Layout: Two level Home Equipment: Cane - quad      Prior Function Level of Independence: Independent         Comments: indep but doesn't drive, pt reports getting groceries delivered and son/grandson takes her to MD appts     Hand Dominance   Dominant Hand: Right    Extremity/Trunk Assessment   Upper Extremity Assessment Upper Extremity Assessment: Overall WFL for tasks assessed    Lower Extremity Assessment Lower Extremity Assessment: Generalized weakness (but reports "I have really bad knees")    Cervical / Trunk Assessment Cervical / Trunk Assessment: Normal  Communication   Communication: No difficulties  Cognition Arousal/Alertness: Awake/alert Behavior During Therapy: WFL for tasks assessed/performed Overall Cognitive Status: Within Functional Limits for tasks assessed                                        General Comments General comments (skin integrity, edema, etc.): pt with no drainage from surgical incision, pt given incentive spirometer to improve breathing capacity and ability, pt with good return demonstrateion    Exercises     Assessment/Plan    PT Assessment Patient needs continued PT services  PT Problem List Decreased strength;Decreased activity tolerance;Decreased balance;Decreased mobility;Decreased coordination;Decreased knowledge of use of DME;Pain       PT Treatment Interventions DME instruction;Gait training;Stair training;Functional mobility training;Therapeutic activities;Balance training;Therapeutic exercise    PT Goals (Current goals can be found in the Care Plan section)  Acute Rehab PT Goals Patient Stated Goal: stop the pain with breathing PT Goal Formulation: With patient Time For Goal Achievement: 07/16/20 Potential to Achieve Goals: Good    Frequency Min 3X/week    Barriers to discharge Decreased caregiver support family works, pt would be home alone for 11-12 hrs    Co-evaluation               AM-PAC PT "6 Clicks" Mobility  Outcome Measure Help needed turning from your back to your side while in a flat bed without using bedrails?: A Little Help needed moving from lying on your back to sitting on the side of a flat bed without using bedrails?: A Little Help needed moving to and from a bed to a chair (including a wheelchair)?: A Little Help needed standing up from a chair using your arms (e.g., wheelchair or bedside chair)?: A Little Help needed to walk in hospital room?: A Little Help needed climbing 3-5 steps with a railing? : A Little 6 Click Score: 18    End of Session   Activity Tolerance: Patient tolerated treatment well Patient left: in chair;with call bell/phone within reach;with nursing/sitter in room Legrand Como, [place]) Nurse Communication: Mobility status PT Visit Diagnosis: Unsteadiness on feet (R26.81);Muscle weakness (generalized) (M62.81);Difficulty in walking, not elsewhere classified (R26.2)    Time: [id number] PT Time Calculation (min) (ACUTE ONLY): 33 min   Charges:   PT Evaluation $PT Eval Moderate Complexity: 1 Mod PT Treatments $Gait Training: 8-22 mins        Laneya Gasaway, PT, DPT Acute Rehabilitation  Services Pager #: [phone] Office #: [phone]   Berline Lopes 07/02/2020, 8:30 AM

## 2020-07-02 NOTE — Anesthesia Postprocedure Evaluation (Signed)
Anesthesia Post Note  Patient: Donna Lawson  Procedure(s) Performed: LAPAROSCOPIC CHOLECYSTECTOMY (N/A Abdomen) INTRAOPERATIVE CHOLANGIOGRAM (N/A Abdomen)     Patient location during evaluation: PACU Anesthesia Type: General Level of consciousness: sedated and patient cooperative Pain management: pain level controlled Vital Signs Assessment: post-procedure vital signs reviewed and stable Respiratory status: spontaneous breathing Cardiovascular status: stable Anesthetic complications: no   No complications documented.  Last Vitals:  Vitals:   07/01/20 1942 07/02/20 0500  BP: (!) 161/87 (!) 166/89  Pulse: 86 80  Resp: 18 17  Temp: 36.6 C 36.5 C  SpO2: 97% 98%    Last Pain:  Vitals:   07/02/20 0500  TempSrc: Oral  PainSc:                  [name]

## 2020-07-03 ENCOUNTER — Telehealth: Payer: Self-pay

## 2020-07-03 DIAGNOSIS — I480 Paroxysmal atrial fibrillation: Secondary | ICD-10-CM

## 2020-07-03 DIAGNOSIS — E876 Hypokalemia: Secondary | ICD-10-CM | POA: Diagnosis not present

## 2020-07-03 DIAGNOSIS — R739 Hyperglycemia, unspecified: Secondary | ICD-10-CM

## 2020-07-03 DIAGNOSIS — I10 Essential (primary) hypertension: Secondary | ICD-10-CM

## 2020-07-03 DIAGNOSIS — K9186 Retained cholelithiasis following cholecystectomy: Secondary | ICD-10-CM

## 2020-07-03 LAB — GLUCOSE, CAPILLARY
Glucose-Capillary: 87 mg/dL (ref 70–99)
Glucose-Capillary: 108 mg/dL — ABNORMAL HIGH (ref 70–99)

## 2020-07-03 MED ORDER — POLYETHYLENE GLYCOL 3350 17 G PO PACK: 17.0000 g | PACK | Freq: Every day | ORAL | 0 refills | Status: DC | PRN

## 2020-07-03 MED ORDER — CARVEDILOL 3.125 MG PO TABS: 3.1250 mg | ORAL_TABLET | Freq: Two times a day (BID) | ORAL | 0 refills | Status: DC

## 2020-07-03 MED ORDER — CARVEDILOL 3.125 MG PO TABS
3.1250 mg | ORAL_TABLET | Freq: Two times a day (BID) | ORAL | Status: DC
  Administered 2020-07-03: 3.125 mg via ORAL
  Filled 2020-07-03: qty 1

## 2020-07-03 MED ORDER — METOPROLOL TARTRATE 5 MG/5ML IV SOLN: 5.0000 mg | Freq: Four times a day (QID) | INTRAVENOUS | Status: DC | PRN

## 2020-07-03 MED ORDER — LABETALOL HCL 5 MG/ML IV SOLN: 10.0000 mg | Freq: Four times a day (QID) | INTRAVENOUS | Status: DC | PRN

## 2020-07-03 MED ORDER — OXYCODONE HCL 5 MG PO TABS: 5.0000 mg | ORAL_TABLET | Freq: Four times a day (QID) | ORAL | 0 refills | Status: DC | PRN

## 2020-07-03 NOTE — Evaluation (Signed)
Physical Therapy Evaluation Patient Details Name: Donna Lawson MRN: [id number] DOB: 10/05/1945 Today's Date: 07/03/2020   History of Present Illness  Pt is a 75 yo female who presented to ED with upper abdominal pain and nausea, found to have cholelithasis who then underwent a Lap chole on 3/15. PMH: HTN, hyperlipidemia  Clinical Impression  Pt was seen for mobility on RW with assistance for supporting posture and to avoid stressing surgery site.  Pt is able to do ex's on the bed afterward, but has been in a lot of pain and not feeling well. Pt is motivated to get home, and wants to continue with mobility there.  Her main issue is abd pain but has B knee pain from OA for last 20 years.  Follow up with PCP regarding need for further tx to knees for management of pain.    Follow Up Recommendations No PT follow up    Equipment Recommendations  Rolling walker with 5" wheels    Recommendations for Other Services       Precautions / Restrictions Precautions Precautions: Fall Precaution Comments: lifting precautions, < 15 pounds Restrictions Weight Bearing Restrictions: No Other Position/Activity Restrictions: no lifting more than 15 pounds      Mobility  Bed Mobility Overal bed mobility: Needs Assistance Bed Mobility: Supine to Sit;Sit to Supine Rolling: Min assist Sidelying to sit: Min assist Supine to sit: Min assist Sit to supine: Min guard        Transfers Overall transfer level: Needs assistance Equipment used: None Transfers: Sit to/from Stand Sit to Stand: Min guard         General transfer comment: min guard but has been taking herself to BR  Ambulation/Gait Ambulation/Gait assistance: Min guard Gait Distance (Feet): 75 Feet Assistive device: Rolling walker (2 wheeled) Gait Pattern/deviations: Step-through pattern;Wide base of support Gait velocity: dec   General Gait Details: used walker due to abd discomfort and postural support  Stairs             Wheelchair Mobility    Modified Rankin (Stroke Patients Only)       Balance Overall balance assessment: Needs assistance Sitting-balance support: Feet supported Sitting balance-Leahy Scale: Fair     Standing balance support: Bilateral upper extremity supported Standing balance-Leahy Scale: Fair                               Pertinent Vitals/Pain Pain Assessment: Faces Faces Pain Scale: Hurts even more Pain Location: surgical site, R abdomen Pain Descriptors / Indicators: Discomfort;Sharp Pain Intervention(s): Limited activity within patient's tolerance;Monitored during session;Premedicated before session;Repositioned    Home Living                        Prior Function                 Hand Dominance        Extremity/Trunk Assessment                Communication      Cognition Arousal/Alertness: Awake/alert Behavior During Therapy: WFL for tasks assessed/performed Overall Cognitive Status: Within Functional Limits for tasks assessed                                        General Comments General comments (skin integrity, edema, etc.): pt is standing  more upright as her knee pain will permit, as well as abd pain    Exercises General Exercises - Lower Extremity Ankle Circles/Pumps: AROM;5 reps Quad Sets: AROM;10 reps Gluteal Sets: AROM;10 reps Hip ABduction/ADduction: AROM;10 reps   Assessment/Plan    PT Assessment    PT Problem List         PT Treatment Interventions      PT Goals (Current goals can be found in the Care Plan section)  Acute Rehab PT Goals Patient Stated Goal: to have less pain and get home    Frequency Min 3X/week   Barriers to discharge        Co-evaluation               AM-PAC PT "6 Clicks" Mobility  Outcome Measure Help needed turning from your back to your side while in a flat bed without using bedrails?: A Little Help needed moving from lying on your back to  sitting on the side of a flat bed without using bedrails?: A Little Help needed moving to and from a bed to a chair (including a wheelchair)?: A Little Help needed standing up from a chair using your arms (e.g., wheelchair or bedside chair)?: A Little Help needed to walk in hospital room?: A Little Help needed climbing 3-5 steps with a railing? : A Little 6 Click Score: 18    End of Session Equipment Utilized During Treatment: Gait belt Activity Tolerance: Patient tolerated treatment well Patient left: in bed;with call bell/phone within reach;with bed alarm set Nurse Communication: Mobility status PT Visit Diagnosis: Unsteadiness on feet (R26.81);Muscle weakness (generalized) (M62.81);Difficulty in walking, not elsewhere classified (R26.2);Pain    Time: 1335-1402 PT Time Calculation (min) (ACUTE ONLY): 27 min   Charges:     PT Treatments $Gait Training: 8-22 mins $Therapeutic Exercise: 8-22 mins       Ramond Dial 07/03/2020, 4:32 PM  Mee Hives, PT MS Acute Rehab Dept. Number: [place] [facility] [place]

## 2020-07-03 NOTE — Progress Notes (Signed)
2 Days Post-Op  Subjective: CC: Patient reports she is doing well.  Having less pain in her abdomen and reports mainly soreness over the incisions.  This is well controlled with medications. Denies any nausea or vomiting.  Tolerating solid diet.  Mobilize with PT yesterday.  Voiding.  Passing flatus.  Objective: Vital signs in last 24 hours: Temp:  [97.7 F (36.5 C)-98.3 F (36.8 C)] 97.7 F (36.5 C) (03/17 0357) Pulse Rate:  [79-85] 79 (03/17 0357) Resp:  [17-18] 17 (03/17 0357) BP: (169-184)/(83-93) 184/93 (03/17 0357) SpO2:  [92 %-98 %] 92 % (03/17 0357) Last BM Date: 06/30/20  Intake/Output from previous day: 03/16 0701 - 03/17 0700 In: 1370 [P.O.:1370] Out: 500 [Urine:500] Intake/Output this shift: Total I/O In: -  Out: 1300 [Urine:1300]  PE: Gen:  Alert, NAD, pleasant Card:  RRR Pulm:  CTAB, no W/R/R, effort normal Abd: Soft, ND, appropriately tender over laparoscopic incisions, +BS, Incisions with glue intact appears well and are without drainage, bleeding, or signs of infection Ext:  No LE edema  Psych: A&Ox3  Skin: no rashes noted, warm and dry  Lab Results:  Recent Labs    06/30/20 2220 07/02/20 0244  WBC 7.1 6.8  HGB 11.1* 11.0*  HCT 34.8* 33.7*  PLT 266 262   BMET Recent Labs    06/30/20 2220 07/02/20 0244  NA 135 135  135  K 3.4* 4.5  4.6  CL 103 103  105  CO2 '24 22  23  '$ GLUCOSE 145* 138*  140*  BUN 8 7*  6*  CREATININE 0.96 0.96  0.95  CALCIUM 9.0 9.5  9.6   PT/INR No results for input(s): LABPROT, INR in the last 72 hours. CMP     Component Value Date/Time   NA 135 07/02/2020 0244   NA 135 07/02/2020 0244   NA 138 06/18/2020 1038   K 4.6 07/02/2020 0244   K 4.5 07/02/2020 0244   CL 105 07/02/2020 0244   CL 103 07/02/2020 0244   CO2 23 07/02/2020 0244   CO2 22 07/02/2020 0244   GLUCOSE 140 (H) 07/02/2020 0244   GLUCOSE 138 (H) 07/02/2020 0244   BUN 6 (L) 07/02/2020 0244   BUN 7 (L) 07/02/2020 0244   BUN 10  06/18/2020 1038   CREATININE 0.95 07/02/2020 0244   CREATININE 0.96 07/02/2020 0244   CALCIUM 9.6 07/02/2020 0244   CALCIUM 9.5 07/02/2020 0244   PROT 6.7 07/02/2020 0244   ALBUMIN 3.4 (L) 07/02/2020 0244   AST 36 07/02/2020 0244   ALT 18 07/02/2020 0244   ALKPHOS 85 07/02/2020 0244   BILITOT 0.8 07/02/2020 0244   GFRNONAA >60 07/02/2020 0244   GFRNONAA >60 07/02/2020 0244   GFRAA >60 07/03/2019 1430   Lipase     Component Value Date/Time   LIPASE 29 06/30/2020 1313       Studies/Results: DG Cholangiogram Operative  Result Date: 07/01/2020 CLINICAL DATA:  75 year old female with cholelithiasis EXAM: INTRAOPERATIVE CHOLANGIOGRAM TECHNIQUE: Cholangiographic images from the C-arm fluoroscopic device were submitted for interpretation post-operatively. Please see the procedural report for the amount of contrast and the fluoroscopy time utilized. COMPARISON:  None. FINDINGS: Surgical instruments project over the upper abdomen. There is cannulation of the cystic duct/gallbladder neck, with antegrade infusion of contrast. Caliber of the extrahepatic ductal system within normal limits. No definite filling defect within the extrahepatic ducts identified. Free flow of contrast across the ampulla. IMPRESSION: Intraoperative cholangiogram demonstrates extrahepatic biliary ducts of unremarkable caliber, with no  definite filling defects identified. Free flow of contrast across the ampulla. Please refer to the dictated operative report for full details of intraoperative findings and procedure Electronically Signed   By: Corrie Mckusick D.O.   On: 07/01/2020 12:13    Anti-infectives: Anti-infectives (From admission, onward)   Start     Dose/Rate Route Frequency Ordered Stop   07/01/20 1430  ciprofloxacin (CIPRO) IVPB 400 mg        400 mg 200 mL/hr over 60 Minutes Intravenous Every 12 hours 07/01/20 1337 07/01/20 1911   07/01/20 0600  ciprofloxacin (CIPRO) IVPB 400 mg        400 mg 200 mL/hr over  60 Minutes Intravenous On call to O.R. 06/30/20 1627 07/01/20 1105       Assessment/Plan HTN - home meds. Appreciate TRH/IM assistance HLD  Hypokalemia - resolvedon yesterdays labwork.  Symptomatic cholelithiasis S/p laparoscopic cholecystectomy with IOC-Dr. Toth-07/01/2020 - POD #2 - IOC negative. LFT's wnl. WBC wnl - Mobilize. - Pulm toilet - The patient is voiding well, tolerating diet, working well with therapies, pain well controlled, vital signs stable, incisions c/d/i. Will check in PM for possible d/c after works with therapies.   FEN -Reg, SLIV VTE -SCDs, Lovenox ID -Cipro periop. None currently.   LOS: 0 days    [name] , [facility] 07/03/2020, 9:04 AM Please see Amion for pager number during day hours 7:00am-4:30pm

## 2020-07-03 NOTE — Discharge Summary (Signed)
[facility] Discharge Summary   Patient ID: Donna Lawson MRN: [id number] DOB/AGE: 1945/09/04 75 y.o.  Admit date: 06/30/2020 Discharge date: 07/03/2020  Admitting Diagnosis: Symptomatic Cholelithiasis  Discharge Diagnosis Patient Active Problem List   Diagnosis Date Noted  . Symptomatic cholelithiasis 06/30/2020  . Cholelithiasis without cholecystitis 06/18/2020  . Chronic cough 06/18/2020  . Acute anemia 06/18/2020  . PAC (premature atrial contraction) 06/17/2020  . Dysuria 04/06/2017  . Anxiety 03/29/2013  . Hypertension   . Hyperlipidemia   . Osteoarthritis of knee   . Obese     Consultants Internal medicine  Imaging: No results found.  Procedures Dr. Marlou Starks (07/01/2020) - Laparoscopic Cholecystectomy with [place] Hospital Course:  Donna Lawson is a 75yo female PMH HTN and HLD who presented to [facility] 3/14 with upper abdominal pain and nausea.  Workup included ultrasound on 2/23 that showed cholelithiasis and a small amount of gallbladder sludge; she was afebrile with normal WBC and LFTs non-elevated.  Patient was admitted and underwent procedure listed above. Intraoperatively patient was found to have a larger gallstone impacted at the neck of the gallbladder; IOC negative for obstruction. Tolerated procedure well and was transferred to the floor.  Diet was advanced as tolerated.  Internal medicine was consulted for assistance with medical management of HTN and hypokalemia. Patient did work with therapies during this admission. On POD2 the patient was voiding well, tolerating diet, ambulating well, pain well controlled, vital signs stable, incisions c/d/i and felt stable for discharge home.  Patient will follow up as below and knows to call with questions or concerns.    I have personally reviewed the patients medication history on the [place] controlled substance database.     Allergies as of 07/03/2020      Reactions   Atorvastatin    Other reaction(s):  myalgias   Cefuroxime Axetil    Other reaction(s): not feel well   Crestor [rosuvastatin Calcium]    Other reaction(s): myalgias   Lisinopril    Tongue swelling   Oxycodone-acetaminophen    Other reaction(s): feel weird      Medication List    STOP taking these medications   hydrochlorothiazide 25 MG tablet Commonly known as: HYDRODIURIL     TAKE these medications   acetaminophen 500 MG tablet Commonly known as: TYLENOL Take 500 mg by mouth every 6 (six) hours as needed for moderate pain or headache.   albuterol 108 (90 Base) MCG/ACT inhaler Commonly known as: VENTOLIN HFA Inhale 2 puffs into the lungs every 6 (six) hours as needed for wheezing or shortness of breath.   ALPRAZolam 0.5 MG tablet Commonly known as: Xanax Take 1 tablet (0.5 mg total) at bedtime as needed by mouth for anxiety or sleep.   amLODipine 10 MG tablet Commonly known as: NORVASC Take 1 tablet (10 mg total) daily by mouth.   carvedilol 3.125 MG tablet Commonly known as: COREG Take 1 tablet (3.125 mg total) by mouth 2 (two) times daily with a meal.   oxyCODONE 5 MG immediate release tablet Commonly known as: Oxy IR/ROXICODONE Take 1 tablet (5 mg total) by mouth every 6 (six) hours as needed for severe pain.   polyethylene glycol 17 g packet Commonly known as: MIRALAX / GLYCOLAX Take 17 g by mouth daily as needed for mild constipation.   potassium chloride 20 MEQ packet Commonly known as: KLOR-CON Take 20 mEq by mouth daily for 7 days.         Follow-up Information    [facility] Go on 07/24/2020.   Specialty: General Surgery Why: Your appointment is 07/24/20 at 2:30pm Please arrive 30 minutes prior to your appointment to check in and fill out paperwork. Bring photo ID and insurance information. Contact information: [address] [place] [phone]       Malka So R, [place] Follow up on 07/22/2020.   Specialty:  Cardiology Contact information: [place] [address] [phone]        Sanjuan Dame, MD. Go on 07/14/2020.   Specialty: Internal Medicine Why: Please go to appointment at 1:15. Please repeat basic lab works cbc/bmp at follow up, please also check your blood pressure at home, bring record for pcp to review, pcp to monitor blood pressure control , adjust blood pressure meds if needed  Contact information: [place] [address] [phone]               Signed: Wellington Hampshire, [facility] 07/03/2020, 3:23 PM Please see Amion for pager number during day hours 7:00am-4:30pm

## 2020-07-03 NOTE — Progress Notes (Addendum)
Subjective:  Donna Lawson states that her blood pressure may be high because she has been under a lot of stress with the surgery. She says last night she called out for a pain pill due to pleuritic pain in her abdomen, although her pain continues to improve. She said yesterday she would get mild vertigo while turning with her walker yesterday although this has resolved today. She has been eating well and says she has been passing a lot of gas. She notes she will not be able to follow up in [facility] next week as she will have no one to take her to her appointment, although she will be able to follow up the week after. She was notified she will also need a follow up appointment with surgery.  Objective:  Vital signs in last 24 hours: Vitals:   07/02/20 0500 07/02/20 1536 07/02/20 2052 07/03/20 0357  BP: (!) 166/89 (!) 169/83 (!) 174/89 (!) 184/93  Pulse: 80 79 85 79  Resp: '17 17 18 17  '$ Temp: 97.7 F (36.5 C) 97.7 F (36.5 C) 98.3 F (36.8 C) 97.7 F (36.5 C)  TempSrc: Oral Oral Oral Oral  SpO2: 98% 96% 98% 92%  Weight:      Height:       General: Elderly female, NAD, laying in bed Cardiac: RRR, no m/r/g Pulmonary: CTABL, no wheezing, rhonchi, rales Abdomen: Soft, mild tenderness over surgical scars, surgical scars well healed, no acute abdomen  Assessment/Plan:  Active Problems:   Symptomatic cholelithiasis  This is a 75 year old female with a history of hypertension, hyperlipidemia, anemia, and history of cholelithiasis who presented with recurrent right upper quadrant pain, and nausea.  She underwent a cholecystectomy on 3/15 surgery also noted to have hypertension, hyperkalemia, have an elevated glucose, and has a history of questionable paroxysmal atrial fibrillation.  Hypokalemia: Thought to be secondary to her hydrochlorothiazide use and decreased PO intake. Has improved since this has been on hold, potassium today is 4.5. Should improve with increasing PO intake.  -Continue  holding hydrochlorothiazide on discharge -Could consider retrying HCTZ outpatient if K remains stable -Repeat BMP outpatient   Hypertension: Patient was on amlodipine and hydrochlorothiazide at home.  HCTZ had been held due to above.  She had hydralazine as needed added however has not required this.  Her blood pressures have remained elevated, up to 180s/90s.  This could be in setting of her recent surgery and abdominal pain. She is asymptomatic from this. However since her hydrochlorothiazide has been stopped we will add additional blood pressure medication while she is in the hospital.  -Start Coreg 3.15 BID, continue this outpatient -Continue amlodipine 10 mg daily -Discontinue hydralazine PRN -Hold HCTZ on discharge -Follow-up scheduled with PCP, patient reported that she was only able to be seen in 2 weeks  Cholelithiasis status post lap chole 3/15: Management per surgery. Remains on pain medication, oxycodone and morphine.   Hyperglycemia: Glucose was elevated while admitted, up to 209.  She is currently on SSI.  A1c on 3/14 was 5.6.  CBGs have around 140s while in the hospital.  Would recommend not starting any medications on discharge.  Will follow up outpatient.  -Can continue SSI while admitted  ?Paroxysmal atrial fibrillation: Presented with questionable atrial fibrillation in February 2022, had a follow-up with atrial fibrillation clinic and a repeat EKG at that time showed sinus rhythm.  -Patient will need to have outpatient cardiology follow-up for ambulatory heart monitor.  Intermittent asthma: Stable, continue to monitor.  Prior  to Admission Living Arrangement: Home Anticipated Discharge Location: Home Barriers to Discharge: Discharge per surgery Dispo: Anticipated discharge in approximately 0-1 day(s).   Asencion Noble, MD 07/03/2020, 7:55 AM Pager: [phone] After 5pm on weekdays and 1pm on weekends: On Call pager [phone]

## 2020-07-03 NOTE — Telephone Encounter (Signed)
Hospital TOC per Dr. Sherry Ruffing, discharge 07/03/2020, appt 07/14/2020.

## 2020-07-14 ENCOUNTER — Encounter: Payer: Medicare HMO | Admitting: Student

## 2020-07-16 ENCOUNTER — Other Ambulatory Visit: Payer: Self-pay

## 2020-07-16 ENCOUNTER — Ambulatory Visit (INDEPENDENT_AMBULATORY_CARE_PROVIDER_SITE_OTHER): Payer: Medicare HMO | Admitting: Student

## 2020-07-16 DIAGNOSIS — E876 Hypokalemia: Secondary | ICD-10-CM | POA: Diagnosis not present

## 2020-07-16 DIAGNOSIS — K8020 Calculus of gallbladder without cholecystitis without obstruction: Secondary | ICD-10-CM | POA: Diagnosis not present

## 2020-07-16 MED ORDER — POTASSIUM CHLORIDE 20 MEQ PO PACK: 20.0000 meq | PACK | Freq: Every day | ORAL | 0 refills | Status: DC

## 2020-07-16 NOTE — Progress Notes (Signed)
  [facility] Internal Medicine Residency Telephone Encounter Continuity Care Appointment  HPI:   This telephone encounter was created for [name] on 07/16/2020 for the following purpose/cc follow-up s/p cholecystectomy.   Past Medical History:  Past Medical History:  Diagnosis Date  . Anxiety   . Arthritis   . Diverticula, colon 1999  . Heart murmur   . History of blood transfusion   . Hyperlipidemia   . Hypertension   . Osteoarthritis of knee    right  . PONV (postoperative nausea and vomiting)       ROS:   As per HPI   Assessment / Plan / Recommendations:   Please see A&P under problem oriented charting for assessment of the patient's acute and chronic medical conditions.   As always, pt is advised that if symptoms worsen or new symptoms arise, they should go to an urgent care facility or to to ER for further evaluation.   Consent and Medical Decision Making:   Patient discussed with Dr. Angelia Mould  This is a telephone encounter between Donna Lawson and Sanjuan Dame on 07/16/2020 for follow-up s/p cholecystectomy. The visit was conducted with the patient located at home and Sanjuan Dame at [facility]. The patient's identity was confirmed using their DOB and current address. The patient has consented to being evaluated through a telephone encounter and understands the associated risks (an examination cannot be done and the patient may need to come in for an appointment) / benefits (allows the patient to remain at home, decreasing exposure to coronavirus). I personally spent 17 minutes on medical discussion.

## 2020-07-16 NOTE — Assessment & Plan Note (Addendum)
Patient presenting to [facility] via telehealth for follow-up from cholecystectomy on 07/01/20. Since discharge on 3/17 patient reports she has been doing well. Pain has been well-controlled and she is slowly advancing her diet. Mentions she is tolerating broths, rice, and jelly now. Denies fevers, abdominal pain. Reports some nausea when she doesn't eat for awhile, but this usually resolves with a small snack. She states she has had regular bowel movements since discharge as well. Discussed she is also trying to move around as much as possible for exercise.  Patient seems to be tolerating procedure well. At next visit will examine incisions and re-check CMP. - Return to clinic on 4/14  - CMP at next visit

## 2020-07-17 DIAGNOSIS — E876 Hypokalemia: Secondary | ICD-10-CM | POA: Insufficient documentation

## 2020-07-17 NOTE — Assessment & Plan Note (Signed)
Patient has had chronic hypokalemia. During her recent hospitalization, HCTZ was stopped due to persistent hypokalemia. She has taken potassium supplements "for awhile" and states it makes her feel more energized. Given her renal function is normal and she is eating soft/liquid diet now, will continue potassium supplements until her next visit. At that time will re-check electrolytes. - Continue potassium 81mq daily - CMP at next visit

## 2020-07-18 HISTORY — PX: GALLBLADDER SURGERY: SHX652

## 2020-07-22 ENCOUNTER — Ambulatory Visit (HOSPITAL_COMMUNITY): Payer: Medicare HMO | Admitting: Physician Assistant

## 2020-07-22 ENCOUNTER — Ambulatory Visit (HOSPITAL_COMMUNITY): Payer: Medicare HMO

## 2020-07-24 NOTE — Progress Notes (Signed)
Internal Medicine Clinic Attending ? ?Case discussed with Dr. Braswell  At the time of the visit.  We reviewed the resident?s history and exam and pertinent patient test results.  I agree with the assessment, diagnosis, and plan of care documented in the resident?s note.  ?

## 2020-07-31 ENCOUNTER — Other Ambulatory Visit: Payer: Self-pay

## 2020-07-31 ENCOUNTER — Ambulatory Visit (INDEPENDENT_AMBULATORY_CARE_PROVIDER_SITE_OTHER): Payer: Medicare HMO | Admitting: Student

## 2020-07-31 ENCOUNTER — Ambulatory Visit (HOSPITAL_COMMUNITY)
Admission: RE | Admit: 2020-07-31 | Discharge: 2020-07-31 | Disposition: A | Discharge status: Home / Self Care | Payer: Medicare HMO | Source: Ambulatory Visit | Attending: Internal Medicine | Admitting: Internal Medicine

## 2020-07-31 ENCOUNTER — Encounter: Payer: Self-pay | Admitting: Student

## 2020-07-31 VITALS — BP 136/82 | HR 75 | Temp 98.0°F | Ht 62.0 in | Wt 149.1 lb

## 2020-07-31 DIAGNOSIS — E782 Mixed hyperlipidemia: Secondary | ICD-10-CM | POA: Diagnosis not present

## 2020-07-31 DIAGNOSIS — I491 Atrial premature depolarization: Secondary | ICD-10-CM

## 2020-07-31 DIAGNOSIS — Z23 Encounter for immunization: Secondary | ICD-10-CM | POA: Diagnosis not present

## 2020-07-31 DIAGNOSIS — R008 Other abnormalities of heart beat: Secondary | ICD-10-CM | POA: Diagnosis not present

## 2020-07-31 DIAGNOSIS — I499 Cardiac arrhythmia, unspecified: Secondary | ICD-10-CM | POA: Diagnosis not present

## 2020-07-31 DIAGNOSIS — N179 Acute kidney failure, unspecified: Secondary | ICD-10-CM

## 2020-07-31 DIAGNOSIS — R053 Chronic cough: Secondary | ICD-10-CM

## 2020-07-31 DIAGNOSIS — I10 Essential (primary) hypertension: Secondary | ICD-10-CM | POA: Diagnosis not present

## 2020-07-31 DIAGNOSIS — E876 Hypokalemia: Secondary | ICD-10-CM | POA: Diagnosis not present

## 2020-07-31 DIAGNOSIS — Z2839 Other underimmunization status: Secondary | ICD-10-CM

## 2020-07-31 DIAGNOSIS — Z789 Other specified health status: Secondary | ICD-10-CM

## 2020-07-31 DIAGNOSIS — R1010 Upper abdominal pain, unspecified: Secondary | ICD-10-CM

## 2020-07-31 DIAGNOSIS — Z9049 Acquired absence of other specified parts of digestive tract: Secondary | ICD-10-CM

## 2020-07-31 NOTE — Patient Instructions (Addendum)
Donna Lawson,   The intermittent pain, bloating, and diarrhea you are experiencing are not atypical this far out after having your gallbladder and should improve over time. Please continue to take Miralax as needed for any constipation you experience.   Your fatigue may be related to a lack of protein in your diet. Please try to increase the amount of lean meats in your diet, which also contain iron. We will also check your thyroid levels today as this could be contributing.   We will check your cholesterol and your potassium and call you regarding your results and any needed changes in medications.   Your EKG showed frequent premature beats (PAC's) which have been seen in the past. You are already on Coreg which can help with this. Your ECHO is scheduled for 08/26/20 which will also help [place] evaluate your heart. We do not need to make changes to your medications today.  Please schedule a return visit in 3 months or sooner if you have any troubles. We are happy to answer any questions you may have at [phone].  It was great seeing you today and take care,  Dr. Konrad Penta    Minimally Invasive Cholecystectomy, Care After This sheet gives you information about how to care for yourself after your procedure. Your health care provider may also give you more specific instructions. If you have problems or questions, contact your health care provider. What can I expect after the procedure? After the procedure, it is common to have:  Pain at your incision sites. You will be given medicines to control this pain.  Mild nausea or vomiting.  Bloating and possible shoulder pain from the gas that was used during the procedure. Follow these instructions at home: Medicines  Take over-the-counter and prescription medicines only as told by your health care provider.  If you were prescribed an antibiotic medicine, take or use it as told by your health care provider. Do not stop using the antibiotic  even if you start to feel better.  Ask your health care provider if the medicine prescribed to you: ? Requires you to avoid driving or using machinery. ? Can cause constipation. You may need to take these actions to prevent or treat constipation:  Drink enough fluid to keep your urine pale yellow.  Take over-the-counter or prescription medicines.  Eat foods that are high in fiber, such as beans, whole grains, and fresh fruits and vegetables.  Limit foods that are high in fat and processed sugars, such as fried or sweet foods. Incision care  Follow instructions from your health care provider about how to take care of your incisions. Make sure you: ? Wash your hands with soap and water for at least 20 seconds before and after you change your bandage (dressing). If soap and water are not available, use hand sanitizer. ? Change your dressing as told by your health care provider. ? Leave stitches (sutures), skin glue, or adhesive strips in place. These skin closures may need to be in place for 2 weeks or longer. If adhesive strip edges start to loosen and curl up, you may trim the loose edges. Do not remove adhesive strips completely unless your health care provider tells you to do that.  Do not take baths, swim, or use a hot tub until your health care provider approves. Ask your health care provider if you may take showers. You may only be allowed to take sponge baths.  Check your incision area every day for signs of  infection. Check for: ? More redness, swelling, or pain. ? Fluid or blood. ? Warmth. ? Pus or a bad smell.   Activity  Rest as told by your health care provider.  Avoid sitting for a long time without moving. Get up to take short walks every 1-2 hours. This is important to improve blood flow and breathing. Ask for help if you feel weak or unsteady.  Do not lift anything that is heavier than 10 lb (4.5 kg), or the limit that you are told, until your health care provider says  that it is safe.  Do not play contact sports until your health care provider approves.  Do not return to work or school until your health care provider approves.  Return to your normal activities as told by your health care provider. Ask your health care provider what activities are safe for you. General instructions  If you were given a sedative during the procedure, it can affect you for several hours. Do not drive or operate machinery until your health care provider says that it is safe.  Keep all follow-up visits as told by your health care provider. This is important. Contact a health care provider if:  You develop a rash.  You have more redness, swelling, or pain around your incisions.  You have fluid or blood coming from your incisions.  Your incisions feel warm to the touch.  You have pus or a bad smell coming from your incisions.  You have a fever.  One or more of your incisions breaks open. Get help right away if:  You have trouble breathing.  You have chest pain.  You have increasing pain in your shoulders.  You faint or feel dizzy when you stand.  You have severe pain in your abdomen.  You have nausea or vomiting that lasts for more than one day.  You have leg pain. Summary  After your procedure, it is common to have pain at the incision sites. You may also have nausea or bloating.  Follow your health care provider's instructions about medicine, activity restrictions, and caring for your incision areas. Do not do activities that require a lot of effort.  Contact a health care provider if you have a fever or other signs of infection, such as more redness, swelling, or pain around the incisions.  Get help right away if you have chest pain, increasing pain in the shoulders, or trouble breathing. This information is not intended to replace advice given to you by your health care provider. Make sure you discuss any questions you have with your health care  provider. Document Revised: 01/03/2019 Document Reviewed: 01/03/2019 Elsevier Patient Education  [employer].

## 2020-07-31 NOTE — Progress Notes (Signed)
   CC: Abdominal Bloating   HPI:  [name] is a 75 y.o. lady living with obesity, HTN, HLD, anemia, hypokalemia and anxiety, who was recently admitted 3/14-3/17 for acute on chronic cholecystitis s/p cholecystectomy, presenting for routine follow up. She states that she continues to experience mild, intermittent RUQ pain that occurs after eating as well as diffuse abdominal bloating that have improved somewhat although slower than she anticipated following surgery. She has intermittent diarrhea although stools are more often than not solid without blood or mucus. She has infrequent nausea with vomiting (food content) and has been eating less due to decreased PO intake, mostly grains, and avoids fatty, greasy foods. She endorses a long history of fatigue and a history of chronic cough. She says she was told by ENT in the past her cough is likely due to GERD and it has resolved after she had the cholecystectomy. Denies CP, fevers, chills, palpitations, or any other symptoms other than anxiety associated with going to the doctor.   Past Medical History:  Diagnosis Date  . Anxiety   . Arthritis   . Diverticula, colon 1999  . Heart murmur   . History of blood transfusion   . Hyperlipidemia   . Hypertension   . Osteoarthritis of knee    right  . PONV (postoperative nausea and vomiting)    Review of Systems:  All others negative except as noted above in HPI.   Physical Exam:  Vitals:   07/31/20 1020 07/31/20 1022  BP: (!) 136/120 136/82  Pulse: 75 75  Temp: 98 F (36.7 C)   TempSrc: Oral   SpO2: 95%   Weight: 149 lb 1.6 oz (67.6 kg)   Height: '5\' 2"'$  (1.575 m)    General: Patient appears well. No acute distress. Eyes: Sclera non-icteric. No conjunctival injection.  HENT: Neck is supple without palpable masses. Mucus membranes are slightly dry. No nasal discharge. Respiratory: Lungs are CTA, bilaterally. No wheezes, rales, or rhonchi.  Cardiovascular: Rate is normal. Rhythm is  irregularly irregular. No murmurs, rubs, or gallops. No lower extremity edema. Abdominal: Soft and non-distended with minimal RUQ tenderness to palpation. No guarding or rebound. Bowel sounds intact. Neurological: Alert and oriented x 3.  Musculoskeletal: There is mild sarcopenia of the extremities. Normal muscle tone.  Skin: No lesions. No rashes.  Psych: Patient appears mildly anxious. Normal tone of voice.   Assessment & Plan:   See Encounters Tab for problem based charting.  Patient discussed with Dr. Evette Doffing.  Jeralyn Bennett, MD 07/31/2020, 12:13 PM Pager: [phone]

## 2020-08-01 DIAGNOSIS — Z9049 Acquired absence of other specified parts of digestive tract: Secondary | ICD-10-CM | POA: Insufficient documentation

## 2020-08-01 DIAGNOSIS — Z23 Encounter for immunization: Secondary | ICD-10-CM | POA: Insufficient documentation

## 2020-08-01 DIAGNOSIS — R1010 Upper abdominal pain, unspecified: Secondary | ICD-10-CM | POA: Insufficient documentation

## 2020-08-01 DIAGNOSIS — N179 Acute kidney failure, unspecified: Secondary | ICD-10-CM | POA: Insufficient documentation

## 2020-08-01 HISTORY — DX: Acquired absence of other specified parts of digestive tract: Z90.49

## 2020-08-01 HISTORY — DX: Upper abdominal pain, unspecified: R10.10

## 2020-08-01 LAB — BMP8+ANION GAP
Anion Gap: 19 mmol/L — ABNORMAL HIGH (ref 10.0–18.0)
BUN/Creatinine Ratio: 8 — ABNORMAL LOW (ref 12–28)
BUN: 11 mg/dL (ref 8–27)
CO2: 21 mmol/L (ref 20–29)
Calcium: 9.8 mg/dL (ref 8.7–10.3)
Chloride: 97 mmol/L (ref 96–106)
Creatinine, Ser: 1.35 mg/dL — ABNORMAL HIGH (ref 0.57–1.00)
Glucose: 126 mg/dL — ABNORMAL HIGH (ref 65–99)
Potassium: 3.7 mmol/L (ref 3.5–5.2)
Sodium: 137 mmol/L (ref 134–144)
eGFR: 41 mL/min/{1.73_m2} — ABNORMAL LOW (ref >59)

## 2020-08-01 LAB — LIPID PANEL
Chol/HDL Ratio: 4.1 ratio (ref 0.0–4.4)
Cholesterol, Total: 259 mg/dL — ABNORMAL HIGH (ref 100–199)
HDL: 63 mg/dL (ref >39)
LDL Chol Calc (NIH): 164 mg/dL — ABNORMAL HIGH (ref 0–99)
Triglycerides: 175 mg/dL — ABNORMAL HIGH (ref 0–149)
VLDL Cholesterol Cal: 32 mg/dL (ref 5–40)

## 2020-08-01 LAB — TSH: TSH: 1.61 u[IU]/mL (ref 0.450–4.500)

## 2020-08-01 NOTE — Assessment & Plan Note (Signed)
Patient without known pulmonary disease.   - Received PCV 13 vaccine today

## 2020-08-01 NOTE — Assessment & Plan Note (Addendum)
Patient's potassium is 3.7 this visit. She was prescribed potassium supplementation for total of 7 days following her recent hospital discharge due to hypokalemia; however, she spoke with Dr. Collene Gobble who extended her therapy due to chronic hypokalemia in the past.   - This may be in the setting of poor PO intake  - Continue to encourage she eat a diet high in leafy greens  - Will continue potassium 13mq daily for now

## 2020-08-01 NOTE — Assessment & Plan Note (Signed)
Patient has a long-standing history of hyperlipidemia although has been intolerant to both atorvastatin and rosuvastatin in the past due to myalgias. Lipid profile today shows: total cholesterol 259, LDL 164, HDL 63. 10-year ASCVD risk is high at 26.3% and she is not currently on lipid-lowering therapy.   - Plan to start fenofibrate  - Continue to encourage diet low in saturated and trans fats  - Encourage daily exercise as tolerated and healthy weight loss

## 2020-08-01 NOTE — Assessment & Plan Note (Signed)
Creatinine is elevated at 1.35 today with previously normal creatinine. This is likely prerenal in the setting of dehydration and poor PO intake, especially with lack of DM history.   - Encouraged increased PO (including fluid) intake  - Monitor renal function closely at follow up visit

## 2020-08-01 NOTE — Assessment & Plan Note (Signed)
Given her cough has resolved following cholecystectomy, it is most likely this was in the setting of GERD. Cannot rule out COPD given smoking history; however, she is currently asymptomatic.   - Consider future PFT's if cough returns

## 2020-08-01 NOTE — Assessment & Plan Note (Signed)
Patient is about 1 month out of cholecystectomy for acute on chronic cholecystitis. Her persistent symptoms noted in HPI are not unexpected given her recent surgery. No residual biliary stones were identified during surgery and she does not have other systemic signs concerning for post-operative infection or other complication.   - Continue to encourage small meals  - Encouraged increase in PO intake and protein intake  - Continue avoidance of fatty, greasy foods  - Continue miralax as needed for constipation

## 2020-08-01 NOTE — Assessment & Plan Note (Signed)
Blood pressure is slightly high today at 136/82; however, this is likely in the setting of significant anxiety in coming to doctor's visits. She is compliant with home medications.   - Continue close monitoring on Coreg 3.'125mg'$  BID

## 2020-08-01 NOTE — Assessment & Plan Note (Addendum)
EKG was performed given irregularly irregular rhythm today which noted NSR although with frequent PAC's, also noted in the past. Patient denies any CP, palpitations, light-headedness or any associated symptoms. Denies any history of A-fib or other arrhythmias.   - Continue Coreg  - She has cardiology appointment scheduled next month

## 2020-08-01 NOTE — Assessment & Plan Note (Signed)
Patient has never had a tetanus vaccine in the past.   - Given TDap vaccine today

## 2020-08-04 NOTE — Progress Notes (Signed)
Internal Medicine Clinic Attending  Case discussed with Dr. Speakman  At the time of the visit.  We reviewed the resident's history and exam and pertinent patient test results.  I agree with the assessment, diagnosis, and plan of care documented in the resident's note.  

## 2020-08-11 ENCOUNTER — Telehealth: Payer: Self-pay | Admitting: Student

## 2020-08-11 NOTE — Telephone Encounter (Signed)
Discussed lab results with patient's daughter-in-law who is her primary point of contact. Discussed she had signs of kidney injury as well as high cholesterol with 10-year ASCVD risk >26% based on her last visit. She continues to have poor PO intake which I suspect is the underlying cause of renal injury. Daughter-in-law states that patient had been intolerant to Atorvastatin in the past. TSH was normal.  Plan: - Will notify front desk to call to schedule follow up appointment around May 3rd-4th - Will order future labs (BMP, vitamin D, 25-OH, CBC given ongoing fatigue)  - Recommend starting low-dose Crestor every other day if vitamin D level returns normal    Jeralyn Bennett, MD 08/11/2020, 5:57 PM Pager: [phone]

## 2020-08-21 ENCOUNTER — Other Ambulatory Visit: Payer: Self-pay

## 2020-08-21 ENCOUNTER — Inpatient Hospital Stay (HOSPITAL_COMMUNITY): Payer: Medicare HMO

## 2020-08-21 ENCOUNTER — Emergency Department (HOSPITAL_COMMUNITY): Payer: Medicare HMO

## 2020-08-21 ENCOUNTER — Inpatient Hospital Stay (HOSPITAL_COMMUNITY)
Admission: EM | Admit: 2020-08-21 | Discharge: 2020-08-24 | DRG: 123 | Disposition: A | Discharge status: Home / Self Care | Payer: Medicare HMO | Attending: Internal Medicine | Admitting: Internal Medicine

## 2020-08-21 DIAGNOSIS — Z7902 Long term (current) use of antithrombotics/antiplatelets: Secondary | ICD-10-CM

## 2020-08-21 DIAGNOSIS — H34232 Retinal artery branch occlusion, left eye: Secondary | ICD-10-CM | POA: Diagnosis not present

## 2020-08-21 DIAGNOSIS — H269 Unspecified cataract: Secondary | ICD-10-CM | POA: Diagnosis present

## 2020-08-21 DIAGNOSIS — E559 Vitamin D deficiency, unspecified: Secondary | ICD-10-CM | POA: Diagnosis present

## 2020-08-21 DIAGNOSIS — Z8249 Family history of ischemic heart disease and other diseases of the circulatory system: Secondary | ICD-10-CM

## 2020-08-21 DIAGNOSIS — J019 Acute sinusitis, unspecified: Secondary | ICD-10-CM | POA: Diagnosis not present

## 2020-08-21 DIAGNOSIS — E876 Hypokalemia: Secondary | ICD-10-CM | POA: Diagnosis present

## 2020-08-21 DIAGNOSIS — E041 Nontoxic single thyroid nodule: Secondary | ICD-10-CM | POA: Diagnosis not present

## 2020-08-21 DIAGNOSIS — Z79899 Other long term (current) drug therapy: Secondary | ICD-10-CM

## 2020-08-21 DIAGNOSIS — E875 Hyperkalemia: Secondary | ICD-10-CM

## 2020-08-21 DIAGNOSIS — Z87891 Personal history of nicotine dependence: Secondary | ICD-10-CM | POA: Diagnosis not present

## 2020-08-21 DIAGNOSIS — E785 Hyperlipidemia, unspecified: Secondary | ICD-10-CM | POA: Diagnosis present

## 2020-08-21 DIAGNOSIS — I6389 Other cerebral infarction: Secondary | ICD-10-CM | POA: Diagnosis not present

## 2020-08-21 DIAGNOSIS — Z66 Do not resuscitate: Secondary | ICD-10-CM | POA: Diagnosis not present

## 2020-08-21 DIAGNOSIS — Z8261 Family history of arthritis: Secondary | ICD-10-CM

## 2020-08-21 DIAGNOSIS — I6522 Occlusion and stenosis of left carotid artery: Secondary | ICD-10-CM | POA: Diagnosis present

## 2020-08-21 DIAGNOSIS — F419 Anxiety disorder, unspecified: Secondary | ICD-10-CM | POA: Diagnosis present

## 2020-08-21 DIAGNOSIS — M791 Myalgia, unspecified site: Secondary | ICD-10-CM | POA: Diagnosis present

## 2020-08-21 DIAGNOSIS — I129 Hypertensive chronic kidney disease with stage 1 through stage 4 chronic kidney disease, or unspecified chronic kidney disease: Secondary | ICD-10-CM | POA: Diagnosis present

## 2020-08-21 DIAGNOSIS — H539 Unspecified visual disturbance: Secondary | ICD-10-CM | POA: Diagnosis not present

## 2020-08-21 DIAGNOSIS — R69 Illness, unspecified: Secondary | ICD-10-CM | POA: Diagnosis not present

## 2020-08-21 DIAGNOSIS — I6523 Occlusion and stenosis of bilateral carotid arteries: Secondary | ICD-10-CM | POA: Diagnosis not present

## 2020-08-21 DIAGNOSIS — Z885 Allergy status to narcotic agent status: Secondary | ICD-10-CM | POA: Diagnosis not present

## 2020-08-21 DIAGNOSIS — G319 Degenerative disease of nervous system, unspecified: Secondary | ICD-10-CM | POA: Diagnosis not present

## 2020-08-21 DIAGNOSIS — Z888 Allergy status to other drugs, medicaments and biological substances status: Secondary | ICD-10-CM

## 2020-08-21 DIAGNOSIS — H349 Unspecified retinal vascular occlusion: Secondary | ICD-10-CM | POA: Diagnosis present

## 2020-08-21 DIAGNOSIS — I10 Essential (primary) hypertension: Secondary | ICD-10-CM | POA: Diagnosis not present

## 2020-08-21 DIAGNOSIS — H5462 Unqualified visual loss, left eye, normal vision right eye: Secondary | ICD-10-CM | POA: Diagnosis present

## 2020-08-21 DIAGNOSIS — D649 Anemia, unspecified: Secondary | ICD-10-CM | POA: Diagnosis not present

## 2020-08-21 DIAGNOSIS — Z20822 Contact with and (suspected) exposure to covid-19: Secondary | ICD-10-CM | POA: Diagnosis not present

## 2020-08-21 DIAGNOSIS — N280 Ischemia and infarction of kidney: Secondary | ICD-10-CM | POA: Diagnosis present

## 2020-08-21 DIAGNOSIS — Z7982 Long term (current) use of aspirin: Secondary | ICD-10-CM | POA: Diagnosis not present

## 2020-08-21 DIAGNOSIS — N1832 Chronic kidney disease, stage 3b: Secondary | ICD-10-CM | POA: Diagnosis present

## 2020-08-21 DIAGNOSIS — I771 Stricture of artery: Secondary | ICD-10-CM | POA: Diagnosis not present

## 2020-08-21 DIAGNOSIS — R519 Headache, unspecified: Secondary | ICD-10-CM | POA: Diagnosis not present

## 2020-08-21 LAB — CBC
HCT: 38.1 % (ref 36.0–46.0)
Hemoglobin: 12.2 g/dL (ref 12.0–15.0)
MCH: 24.1 pg — ABNORMAL LOW (ref 26.0–34.0)
MCHC: 32 g/dL (ref 30.0–36.0)
MCV: 75.1 fL — ABNORMAL LOW (ref 80.0–100.0)
Platelets: 313 10*3/uL (ref 150–400)
RBC: 5.07 MIL/uL (ref 3.87–5.11)
RDW: 16.8 % — ABNORMAL HIGH (ref 11.5–15.5)
WBC: 7.6 10*3/uL (ref 4.0–10.5)
nRBC: 0 % (ref 0.0–0.2)

## 2020-08-21 LAB — DIFFERENTIAL
Abs Immature Granulocytes: 0.04 10*3/uL (ref 0.00–0.07)
Basophils Absolute: 0.1 10*3/uL (ref 0.0–0.1)
Basophils Relative: 1 %
Eosinophils Absolute: 0.1 10*3/uL (ref 0.0–0.5)
Eosinophils Relative: 2 %
Immature Granulocytes: 1 %
Lymphocytes Relative: 20 %
Lymphs Abs: 1.5 10*3/uL (ref 0.7–4.0)
Monocytes Absolute: 0.4 10*3/uL (ref 0.1–1.0)
Monocytes Relative: 6 %
Neutro Abs: 5.4 10*3/uL (ref 1.7–7.7)
Neutrophils Relative %: 70 %

## 2020-08-21 LAB — COMPREHENSIVE METABOLIC PANEL
ALT: 9 U/L (ref 0–44)
AST: 15 U/L (ref 15–41)
Albumin: 3.7 g/dL (ref 3.5–5.0)
Alkaline Phosphatase: 95 U/L (ref 38–126)
Anion gap: 11 (ref 5–15)
BUN: 11 mg/dL (ref 8–23)
CO2: 26 mmol/L (ref 22–32)
Calcium: 9.6 mg/dL (ref 8.9–10.3)
Chloride: 96 mmol/L — ABNORMAL LOW (ref 98–111)
Creatinine, Ser: 1.34 mg/dL — ABNORMAL HIGH (ref 0.44–1.00)
GFR, Estimated: 41 mL/min — ABNORMAL LOW (ref >60)
Glucose, Bld: 122 mg/dL — ABNORMAL HIGH (ref 70–99)
Potassium: 2.7 mmol/L — CL (ref 3.5–5.1)
Sodium: 133 mmol/L — ABNORMAL LOW (ref 135–145)
Total Bilirubin: 1.1 mg/dL (ref 0.3–1.2)
Total Protein: 7.1 g/dL (ref 6.5–8.1)

## 2020-08-21 LAB — I-STAT CHEM 8, ED
BUN: 11 mg/dL (ref 8–23)
Calcium, Ion: 1.17 mmol/L (ref 1.15–1.40)
Chloride: 96 mmol/L — ABNORMAL LOW (ref 98–111)
Creatinine, Ser: 1.3 mg/dL — ABNORMAL HIGH (ref 0.44–1.00)
Glucose, Bld: 122 mg/dL — ABNORMAL HIGH (ref 70–99)
HCT: 37 % (ref 36.0–46.0)
Hemoglobin: 12.6 g/dL (ref 12.0–15.0)
Potassium: 2.8 mmol/L — ABNORMAL LOW (ref 3.5–5.1)
Sodium: 134 mmol/L — ABNORMAL LOW (ref 135–145)
TCO2: 26 mmol/L (ref 22–32)

## 2020-08-21 LAB — C-REACTIVE PROTEIN: CRP: 0.7 mg/dL (ref <1.0)

## 2020-08-21 LAB — APTT: aPTT: 29 seconds (ref 24–36)

## 2020-08-21 LAB — SEDIMENTATION RATE: Sed Rate: 28 mm/hr — ABNORMAL HIGH (ref 0–22)

## 2020-08-21 LAB — PROTIME-INR
INR: 1.1 (ref 0.8–1.2)
Prothrombin Time: 13.9 seconds (ref 11.4–15.2)

## 2020-08-21 LAB — CBG MONITORING, ED: Glucose-Capillary: 122 mg/dL — ABNORMAL HIGH (ref 70–99)

## 2020-08-21 MED ORDER — SODIUM CHLORIDE 0.9% FLUSH: 3.0000 mL | Freq: Once | INTRAVENOUS | Status: DC

## 2020-08-21 MED ORDER — ACETAMINOPHEN 325 MG PO TABS
650.0000 mg | ORAL_TABLET | Freq: Four times a day (QID) | ORAL | Status: DC | PRN
  Filled 2020-08-21: qty 2

## 2020-08-21 MED ORDER — LORAZEPAM 2 MG/ML IJ SOLN
1.0000 mg | Freq: Once | INTRAMUSCULAR | Status: AC | PRN
  Administered 2020-08-21: 1 mg via INTRAVENOUS
  Filled 2020-08-21: qty 1

## 2020-08-21 MED ORDER — POTASSIUM CHLORIDE 20 MEQ PO PACK
60.0000 meq | PACK | Freq: Once | ORAL | Status: AC
  Administered 2020-08-21: 60 meq via ORAL
  Filled 2020-08-21: qty 3

## 2020-08-21 MED ORDER — ALPRAZOLAM 0.5 MG PO TABS: 0.5000 mg | ORAL_TABLET | Freq: Every evening | ORAL | Status: DC | PRN

## 2020-08-21 MED ORDER — ALBUTEROL SULFATE (2.5 MG/3ML) 0.083% IN NEBU: 2.5000 mg | INHALATION_SOLUTION | Freq: Four times a day (QID) | RESPIRATORY_TRACT | Status: DC | PRN

## 2020-08-21 MED ORDER — POTASSIUM CHLORIDE CRYS ER 20 MEQ PO TBCR: 60.0000 meq | EXTENDED_RELEASE_TABLET | Freq: Once | ORAL | Status: DC

## 2020-08-21 MED ORDER — IOHEXOL 350 MG/ML SOLN
80.0000 mL | Freq: Once | INTRAVENOUS | Status: AC | PRN
  Administered 2020-08-21: 80 mL via INTRAVENOUS

## 2020-08-21 MED ORDER — ACETAMINOPHEN 650 MG RE SUPP: 650.0000 mg | Freq: Four times a day (QID) | RECTAL | Status: DC | PRN

## 2020-08-21 MED ORDER — ENOXAPARIN SODIUM 40 MG/0.4ML IJ SOSY
40.0000 mg | PREFILLED_SYRINGE | INTRAMUSCULAR | Status: DC
Start: 2020-08-22 — End: 2020-08-24
  Administered 2020-08-23 – 2020-08-24 (×2): 40 mg via SUBCUTANEOUS
  Filled 2020-08-21 (×2): qty 0.4

## 2020-08-21 MED ORDER — CARVEDILOL 3.125 MG PO TABS
3.1250 mg | ORAL_TABLET | Freq: Two times a day (BID) | ORAL | Status: DC
  Filled 2020-08-21: qty 1

## 2020-08-21 MED ORDER — POTASSIUM CHLORIDE CRYS ER 20 MEQ PO TBCR
40.0000 meq | EXTENDED_RELEASE_TABLET | Freq: Two times a day (BID) | ORAL | Status: AC
  Administered 2020-08-22: 40 meq via ORAL
  Filled 2020-08-21: qty 2

## 2020-08-21 NOTE — Consult Note (Addendum)
Neurology Consultation  Reason for Consult: Branch retinal artery occlusion versus giant cell arteritis Referring Physician: Dr. Vallery Ridge  CC: Diminished visual acuity of the left eye  History is obtained from: Patient  HPI: Donna Lawson is a 75 y.o. female past medical history of hypertension, hyperlipidemia, presented to emergency room upon recommendation from the ophthalmologist which she had seen for left eye visual changes. She describes as if the vision from her left eye appears to be blurred and seems like she is looking at objects through a slit.  She went to her ophthalmologist, who examined and evaluated her.  She also reports some temporal headache and jaw claudication while chewing.  She also reports of some neck weakness/discomfort upon waking up every morning. Ophthalmological evaluation was consistent with disc edema and possible branch retinal artery occlusion-more in favor of giant cell arteritis-in the left eye concerning for giant cell arteritis. She was sent in to the emergency room for further work-up and evaluation with blood work and imaging. Her ESR in the emergency room was 28 and CRP is normal at 0.7-which is not very impressive for giant cell arteritis Denies any focal tingling numbness weakness or headaches other than temporal discomfort. Reports significant weight loss due to difficulty chewing but no dysphagia.    LKW: 2 days ago-last known well was Tuesday afternoon 08/19/2020. tpa given?: no, outside the window Premorbid modified Rankin scale (mRS): 0 ROS: Full ROS was performed and is negative except as noted in the HPI.   Past Medical History:  Diagnosis Date  . Anxiety   . Arthritis   . Diverticula, colon 1999  . Heart murmur   . History of blood transfusion   . Hyperlipidemia   . Hypertension   . Osteoarthritis of knee    right  . PONV (postoperative nausea and vomiting)    Family History  Problem Relation Age of Onset  . Arthritis Mother    . Arthritis Father   . Hypertension Other    Social History:   reports that she has quit smoking. She has never used smokeless tobacco. She reports that she does not drink alcohol and does not use drugs.  Medications  Current Facility-Administered Medications:  .  LORazepam (ATIVAN) injection 1 mg, 1 mg, Intravenous, Once PRN, Charlesetta Shanks, MD .  Derrill Memo ON 08/22/2020] potassium chloride SA (KLOR-CON) CR tablet 40 mEq, 40 mEq, Oral, BID, Seawell, Jaimie A, DO .  sodium chloride flush (NS) 0.9 % injection 3 mL, 3 mL, Intravenous, Once, Sofia, Leslie K, PA-C  Current Outpatient Medications:  .  acetaminophen (TYLENOL) 500 MG tablet, Take 500 mg by mouth every 6 (six) hours as needed for moderate pain or headache., Disp: , Rfl:  .  albuterol (VENTOLIN HFA) 108 (90 Base) MCG/ACT inhaler, Inhale 2 puffs into the lungs every 6 (six) hours as needed for wheezing or shortness of breath., Disp: 8 g, Rfl: 6 .  ALPRAZolam (XANAX) 0.5 MG tablet, Take 1 tablet (0.5 mg total) at bedtime as needed by mouth for anxiety or sleep., Disp: 30 tablet, Rfl: 1 .  amLODipine (NORVASC) 10 MG tablet, Take 1 tablet (10 mg total) daily by mouth., Disp: 90 tablet, Rfl: 1 .  carvedilol (COREG) 3.125 MG tablet, Take 1 tablet (3.125 mg total) by mouth 2 (two) times daily with a meal., Disp: 60 tablet, Rfl: 0 .  oxyCODONE (OXY IR/ROXICODONE) 5 MG immediate release tablet, Take 1 tablet (5 mg total) by mouth every 6 (six) hours as needed for severe  pain., Disp: 15 tablet, Rfl: 0 .  polyethylene glycol (MIRALAX / GLYCOLAX) 17 g packet, Take 17 g by mouth daily as needed for mild constipation., Disp: 14 each, Rfl: 0 .  potassium chloride (KLOR-CON) 20 MEQ packet, Take 20 mEq by mouth daily for 21 days., Disp: 21 packet, Rfl: 0   Exam: Current vital signs: BP (!) 155/71   Pulse 87   Temp 98.4 F (36.9 C)   Resp 20   SpO2 95%  Vital signs in last 24 hours: Temp:  [98.4 F (36.9 C)] 98.4 F (36.9 C) (05/05  1648) Pulse Rate:  [42-90] 87 (05/05 2100) Resp:  [16-21] 20 (05/05 2100) BP: (155-177)/(69-92) 155/71 (05/05 2100) SpO2:  [95 %-99 %] 95 % (05/05 2100)  GENERAL: Awake, alert in NAD HEENT: - Normocephalic and atraumatic, dry mm, no LN++, no Thyromegally LUNGS - Clear to auscultation bilaterally with no wheezes CV - S1S2 RRR, no m/r/g, equal pulses bilaterally. ABDOMEN - Soft, nontender, nondistended with normoactive BS Ext: warm, well perfused, intact peripheral pulses, no edema  NEURO:  Mental Status: AA&Ox3  Language: speech is clear.  Naming, repetition, fluency, and comprehension intact. Cranial Nerves: PERRL. EOMI, visual fields full, diminished visual acuity in the left eye, no facial asymmetry, facial sensation intact, hearing reduced bilaterally, tongue/uvula/soft palate midline, normal sternocleidomastoid and trapezius muscle strength. No evidence of tongue atrophy or fibrillations Motor: 5/5 without drift in all fours Tone: is normal and bulk is normal Sensation- Intact to light touch bilaterally Coordination: FTN intact bilaterally, no ataxia in BLE. Gait- deferred  NIHSS-0  Labs I have reviewed labs in epic and the results pertinent to this consultation are:  CBC    Component Value Date/Time   WBC 7.6 08/21/2020 1647   RBC 5.07 08/21/2020 1647   HGB 12.6 08/21/2020 1735   HGB 12.0 06/18/2020 1038   HCT 37.0 08/21/2020 1735   HCT 37.7 06/18/2020 1038   PLT 313 08/21/2020 1647   PLT 393 06/18/2020 1038   MCV 75.1 (L) 08/21/2020 1647   MCV 73 (L) 06/18/2020 1038   MCH 24.1 (L) 08/21/2020 1647   MCHC 32.0 08/21/2020 1647   RDW 16.8 (H) 08/21/2020 1647   RDW 16.7 (H) 06/18/2020 1038   LYMPHSABS 1.5 08/21/2020 1647   LYMPHSABS 1.5 06/18/2020 1038   MONOABS 0.4 08/21/2020 1647   EOSABS 0.1 08/21/2020 1647   EOSABS 0.2 06/18/2020 1038   BASOSABS 0.1 08/21/2020 1647   BASOSABS 0.1 06/18/2020 1038    CMP     Component Value Date/Time   NA 134 (L) 08/21/2020  1735   NA 137 07/31/2020 1153   K 2.8 (L) 08/21/2020 1735   CL 96 (L) 08/21/2020 1735   CO2 26 08/21/2020 1647   GLUCOSE 122 (H) 08/21/2020 1735   BUN 11 08/21/2020 1735   BUN 11 07/31/2020 1153   CREATININE 1.30 (H) 08/21/2020 1735   CALCIUM 9.6 08/21/2020 1647   PROT 7.1 08/21/2020 1647   ALBUMIN 3.7 08/21/2020 1647   AST 15 08/21/2020 1647   ALT 9 08/21/2020 1647   ALKPHOS 95 08/21/2020 1647   BILITOT 1.1 08/21/2020 1647   GFRNONAA 41 (L) 08/21/2020 1647   GFRAA >60 07/03/2019 1430    Lipid Panel     Component Value Date/Time   CHOL 259 (H) 07/31/2020 1153   TRIG 175 (H) 07/31/2020 1153   HDL 63 07/31/2020 1153   CHOLHDL 4.1 07/31/2020 1153   CHOLHDL 5 07/01/2016 0905   VLDL 42.8 (H)  07/01/2016 0905   LDLCALC 164 (H) 07/31/2020 1153   LDLDIRECT 166.0 07/01/2016 0905     Imaging I have reviewed the images obtained:  CT-scan of the brain-- IMPRESSION: 1. No CT evidence for acute intracranial abnormality. Atrophy and mild chronic small vessel ischemic change of the white matter 2. Sinus disease   Assessment:  75 year old woman with sudden onset of left eye vision loss with ophthalmological exam concerning for left eye optic disc edema and possible branch retinal artery occlusion with gentle arteritis on differential. Also gave history of jaw claudication and weight loss due to difficulty chewing but no dysphagia. Sed rate of 28 and normal CRP-make GCA less likely but the ophthalmology evaluation with disc edema and only branch retinal artery occlusion-ophthalmology is concerned and has high suspicion for giant cell arteritis. Giant cell arteritis exam the differentials but a artery to artery embolism should also be considered.  Needs full stroke work-up  Recommendations:  Admit to medicine  Telemetry  Frequent neurochecks  MRI brain without contrast  CTA head and neck  2D echo  A1c  Lipid panel  Aspirin 325 for now  We will leave the discussion  for statin for the morning team-she has allergy to atorvastatin and Crestor-myalgias-we will need further information on this.  PT  OT  Speech therapy  No need for permissive hypertension  N.p.o. until cleared by bedside swallow screen or formal swallow evaluation  I do not think that we should start her on steroids at this time for presumed GCA due to normal ESR and CRP-we should wait for vessel imaging before making those decisions.  Discussed with the admitting team resident physicians in the ER.  -- Amie Portland, MD Neurologist [facility] Pager: [phone]

## 2020-08-21 NOTE — ED Triage Notes (Signed)
Pt sent from eye doctor after losing part of her vision in her L eye yesterday. Concern for retinal artery occlusion. Endorses headache.

## 2020-08-21 NOTE — ED Notes (Signed)
Patient transported to CT 

## 2020-08-21 NOTE — H&P (Incomplete)
Date: 08/21/2020               Patient Name:  Donna Lawson MRN: [id number]  DOB: 08/04/1945 Age / Sex: 75 y.o., female   PCP: Gaylan Gerold, DO         Medical Service: Internal Medicine Teaching Service         Attending Physician: Dr. Joni Reining, MD    First Contact: Dr. Iona Beard, MD Pager: [phone]  Second Contact: Dr. Maudie Mercury, MD Pager: [phone]       After Hours (After 5p/  First Contact Pager: [phone]  weekends / holidays): Second Contact Pager: [phone]   Chief Complaint: Partial vision loss  History of Present Illness: Donna Lawson is a 75 year old woman with past medical history significant for hypertension, hyperlipidemia (intolerant of statins), frequent PACs, chronic hypokalemia, and anxiety who presented to [facility] from ophthalmologist office for evaluation of partial vision loss.  Patient reports that two mornings prior to arrival while watching television at home, she had the sudden onset of painless partial vision loss in her left eye which she describes as "looking through a slit." Patient states that this resolved approximately five minutes later and there were no further associated symptoms. Later that evening at 730PM, patient had the recurrence of the same visual deficit without additional symptoms. She denies eye pain, tearing, discharge, redness, swelling, or itching. She has no right sided visual deficits. She denies any further weakness or sensory changes. She called multiple ophthalmology practices on Wednesday and was unable to be seen until this morning. 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   Of note, patient endorses pain with chewing for the past  month and a half. She denies associated headaches or temporal pain. She does endorse having weakness in her neck upon awakening in the morning but denies any symptoms of pain or weakness in her shoulders or lower extremities. She states that she has been eating and drinking well at home recently which has improved following her cholecystectomy approximately 1.5 months prior.  ED Course: On arrival to the ED, patient hypertensive to 168/82, but otherwise hemodynamically stable and saturating well on room air. EKG revealed frequent multifocal PACs with heart rate of 84. Labs collected with CMP revealing sodium 133, potassium 2.7, creatinine 1.34; ESR minimally elevated to 28; CRP within normal limits; CBC with diff, INR, and APTT unremarkable. CT Head WO Contrast showed no acute intracranial abnormality, however atrophy and mild chronic vessel ichemic changes of white matter. CTA Head and Neck as well as MRI Brain ordered. Neurology was consulted and IMTS was contacted for admission.  Medications: Acetaminophen 564m every six hours PRN Albuterol 2 puffs every six hours PRN Alprazolam 0.574mnightly PRN Amlodipine 1075maily Carvedilol 3.125m63mice daily Oxycodone 5mg 45mry six hours PRN Polyethylene glycol 17g daily PRN Potassium chloride 20mEq86mly  Allergies: Allergies as of 08/21/2020 - Review Complete 08/21/2020  Allergen Reaction Noted  . Atorvastatin  03/10/2020  . Cefuroxime axetil  03/10/2020  . Crestor [rosuvastatin calcium]  03/10/2020  . Lisinopril  05/16/2012  . Oxycodone-acetaminophen  03/10/2020   Past Medical History:  Diagnosis Date  . Anxiety   . Arthritis   . Diverticula, colon 1999  . Heart murmur   .  History of blood transfusion   . Hyperlipidemia   . Hypertension   . Osteoarthritis of knee    right  . PONV (postoperative nausea and vomiting)    Family History: Family History  Problem Relation Age of Onset  . Arthritis Mother   . Arthritis Father   .  Hypertension Other    Social History:  Patient lives with her pet Avis in [place]. Her husband passed away approximately twelve years ago. She denies tobacco, alcohol or drug use.   Review of Systems: A complete ROS was negative except as per HPI.  Physical Exam: Blood pressure (!) 155/71, pulse 87, temperature 98.4 F (36.9 C), resp. rate 20, SpO2 95 %. Physical Exam  EKG: personally reviewed my interpretation is multifocal PACs with heart rate of 84.  Assessment & Plan by Problem: Active Problems:   Retinal artery occlusion  Donna Lawson is a 75 year old woman with past medical history significant for hypertension, hyperlipidemia, frequent PACs, chronic hypokalemia, recent cholecystectomy, and anxiety who presented to [facility] from ophthalmologist office for evaluation of partial vision loss concerning for retinal artery occlusion versus stroke.  #Sudden onset left eye partial vision loss Patient presenting following two day history of unilateral partial vision loss that occurred suddenly. Patient was evaluated by ophthalmology office today with recommendation to present to the emergency department due to concern for retinal artery occlusion, cerebrovascular accident versus giant cell temporal arteritis. Patient's inflammatory markers only reveal a mildly elevated ESR to 28, otherwise normal CRP. CT Head negative for acute abnormality although CTA Head and Neck as well as MRI Brain are pending. Leading suspicion is retinal artery occlusion versus occipital CVA. The etiology of this condition may be related to her frequent PACs as described below. -Neurology consulted, appreciate recommendations  -CTA Head and Neck  -MRI Brain without contrast  -Telemetry  -Echocardiogram  -Hemoglobin A1c  -Lipid panel (has been unable to tolerate various statins even with infrequent dosing)  -Aspirin 325  -PT/OT/SLP  -Bedside swallow  -Hold off on starting steroids while performing workup  as described above  #Frequent PACs, chronic Patient has a history of frequent PACs for which she was referred to cardiology with initial appointment scheduled on 08/26/2020 as she has cancelled her appointment scheduled on 07/22/2020. Her PACs may be related to electrolyte derangements including hypokalemia with potassium of 2.7. Patient's frequent PACs raise the concern of a possible cardioembolic source for her partial vision loss. -Telemetry -Echocardiogram  #Acute kidney injury Patient's baseline creatinine appears to be approximately 1.0, however for the past three weeks her creatinine has been mildly elevated to 1.3 which was attributed to poor oral intake surrounding her recent cholecystectomy. Patient does have associated hypokalemia which appears to be chronic. -  #Hypertension, chronic Patient has a history of hypertension for which she is prescribed amlodipine 10m daily and carvedilol 3.1272mtwice daily. Blood pressure following admission has remained persistently elevated to as high as 177/92.  -No need for permissive hypertension -Restart amlodipine 1015maily -Restart   #Hyperlipidemia, chronic   #VTE ppx: #IVF:  #Code status:  #Bowel regimen:  Dispo: Admit patient to Observation with expected length of stay less than 2 midnights.  Signed: JohCato MulliganD 08/21/2020, 11:58 PM  Pager: [phone] 5pm on weekdays and 1pm on weekends: On Call pager: [phone]

## 2020-08-21 NOTE — ED Provider Notes (Signed)
[facility] EMERGENCY DEPARTMENT Provider Note   CSN: [id number] Arrival date & time: 08/21/20  1631     History No chief complaint on file.   Donna Lawson is a 75 y.o. female.  HPI Patient was referred by ophthalmology to the emergency department for concern of possible central retinal artery occlusion or temporal arteritis.  Patient had a full dilated exam at ophthalmology prior to arrival.  Patient had a painless visual change that had the perception of looking through a slit.  Occurred only in the left eye.  First episode was self-limited and resolved with return to normal vision.  Patient did not immediately seek evaluation.  Subsequently it recurred several days later with persistence of visual deficit.  Again no pain but a limited field of vision.  No other associated symptoms.  No headache no confusion.  No nausea no vomiting.  No other neurologic deficit.  Patient reports otherwise feeling well.    Past Medical History:  Diagnosis Date  . Anxiety   . Arthritis   . Diverticula, colon 1999  . Heart murmur   . History of blood transfusion   . Hyperlipidemia   . Hypertension   . Osteoarthritis of knee    right  . PONV (postoperative nausea and vomiting)     Patient Active Problem List   Diagnosis Date Noted  . AKI (acute kidney injury) ([facility]) 08/01/2020  . Recurrent upper abdominal pain with history of cholecystectomy 08/01/2020  . Need for Tdap vaccination 08/01/2020  . 13-polyvalent pneumococcal conjugate vaccine administered 08/01/2020  . Hypokalemia 07/17/2020  . Chronic cough 06/18/2020  . Microcytic anemia 06/18/2020  . PAC (premature atrial contraction) 06/17/2020  . Dysuria 04/06/2017  . Anxiety 03/29/2013  . Hypertension   . Hyperlipidemia   . Osteoarthritis of knee     Past Surgical History:  Procedure Laterality Date  . ABDOMINAL SURGERY    . CHOLECYSTECTOMY N/A 07/01/2020   Procedure: LAPAROSCOPIC CHOLECYSTECTOMY;  Surgeon:  Jovita Kussmaul, MD;  Location: [facility];  Service: General;  Laterality: N/A;  . COLONOSCOPY    . INTRAOPERATIVE CHOLANGIOGRAM N/A 07/01/2020   Procedure: INTRAOPERATIVE CHOLANGIOGRAM;  Surgeon: Jovita Kussmaul, MD;  Location: [facility];  Service: General;  Laterality: N/A;  . NO PAST SURGERIES       OB History   No obstetric history on file.     Family History  Problem Relation Age of Onset  . Arthritis Mother   . Arthritis Father   . Hypertension Other     Social History   Tobacco Use  . Smoking status: Former Research scientist (life sciences)  . Smokeless tobacco: Never Used  Substance Use Topics  . Alcohol use: No    Alcohol/week: 0.0 standard drinks  . Drug use: No    Home Medications Prior to Admission medications   Medication Sig Start Date End Date Taking? Authorizing Provider  acetaminophen (TYLENOL) 500 MG tablet Take 500 mg by mouth every 6 (six) hours as needed for moderate pain or headache.    [provider]  albuterol (VENTOLIN HFA) 108 (90 Base) MCG/ACT inhaler Inhale 2 puffs into the lungs every 6 (six) hours as needed for wheezing or shortness of breath. 04/29/20   Freddi Starr, MD  ALPRAZolam Duanne Moron) 0.5 MG tablet Take 1 tablet (0.5 mg total) at bedtime as needed by mouth for anxiety or sleep. 02/25/17   Nche, Charlene Brooke, NP  amLODipine (NORVASC) 10 MG tablet Take 1 tablet (10 mg total) daily by mouth.  02/25/17   Nche, Charlene Brooke, NP  carvedilol (COREG) 3.125 MG tablet Take 1 tablet (3.125 mg total) by mouth 2 (two) times daily with a meal. 07/03/20   Meuth, Brooke A, PA-C  oxyCODONE (OXY IR/ROXICODONE) 5 MG immediate release tablet Take 1 tablet (5 mg total) by mouth every 6 (six) hours as needed for severe pain. 07/03/20   Meuth, Brooke A, PA-C  polyethylene glycol (MIRALAX / GLYCOLAX) 17 g packet Take 17 g by mouth daily as needed for mild constipation. 07/03/20   Meuth, Brooke A, PA-C  potassium chloride (KLOR-CON) 20 MEQ packet Take 20 mEq by mouth daily for 21 days.  07/16/20 08/06/20  Sanjuan Dame, MD  lisinopril-hydrochlorothiazide (PRINZIDE,ZESTORETIC) 20-25 MG per tablet Take 1 tablet by mouth 2 (two) times daily. Take 1 by mouth daily 03/17/12 05/10/12  [provider]    Allergies    Atorvastatin, Cefuroxime axetil, Crestor [rosuvastatin calcium], Lisinopril, and Oxycodone-acetaminophen  Review of Systems   Review of Systems 10 systems reviewed and negative except as per HPI Physical Exam Updated Vital Signs BP (!) 163/78   Pulse (!) 42   Temp 98.4 F (36.9 C)   Resp (!) 21   SpO2 96%   Physical Exam Constitutional:      Appearance: She is well-developed.  HENT:     Head: Normocephalic and atraumatic.     Mouth/Throat:     Pharynx: Oropharynx is clear.  Eyes:     Extraocular Movements: Extraocular movements intact.     Conjunctiva/sclera: Conjunctivae normal.     Pupils: Pupils are equal, round, and reactive to light.  Cardiovascular:     Rate and Rhythm: Normal rate and regular rhythm.     Heart sounds: Normal heart sounds.  Pulmonary:     Effort: Pulmonary effort is normal.     Breath sounds: Normal breath sounds.  Abdominal:     General: Bowel sounds are normal. There is no distension.     Palpations: Abdomen is soft.     Tenderness: There is no abdominal tenderness.  Musculoskeletal:        General: Normal range of motion.     Cervical back: Neck supple.  Skin:    General: Skin is warm and dry.  Neurological:     General: No focal deficit present.     Mental Status: She is alert and oriented to person, place, and time.     GCS: GCS eye subscore is 4. GCS verbal subscore is 5. GCS motor subscore is 6.     Cranial Nerves: No cranial nerve deficit.     Motor: No weakness.     Coordination: Coordination normal.     Gait: Gait normal.  Psychiatric:        Mood and Affect: Mood normal.     ED Results / Procedures / Treatments   Labs (all labs ordered are listed, but only abnormal results are  displayed) Labs Reviewed  SEDIMENTATION RATE - Abnormal; Notable for the following components:      Result Value   Sed Rate 28 (*)    All other components within normal limits  CBC - Abnormal; Notable for the following components:   MCV 75.1 (*)    MCH 24.1 (*)    RDW 16.8 (*)    All other components within normal limits  COMPREHENSIVE METABOLIC PANEL - Abnormal; Notable for the following components:   Sodium 133 (*)    Potassium 2.7 (*)    Chloride 96 (*)  Glucose, Bld 122 (*)    Creatinine, Ser 1.34 (*)    GFR, Estimated 41 (*)    All other components within normal limits  I-STAT CHEM 8, ED - Abnormal; Notable for the following components:   Sodium 134 (*)    Potassium 2.8 (*)    Chloride 96 (*)    Creatinine, Ser 1.30 (*)    Glucose, Bld 122 (*)    All other components within normal limits  CBG MONITORING, ED - Abnormal; Notable for the following components:   Glucose-Capillary 122 (*)    All other components within normal limits  C-REACTIVE PROTEIN  PROTIME-INR  APTT  DIFFERENTIAL    EKG EKG Interpretation  Date/Time:  Friday Aug 22 2020 06:29:44 EDT Ventricular Rate:  64 PR Interval:  227 QRS Duration: 92 QT Interval:  419 QTC Calculation: 433 R Axis:   46 Text Interpretation: Sinus rhythm Prolonged PR interval Low voltage, precordial leads Nonspecific T abnormalities, diffuse leads No significant change since last tracing Confirmed by Deno Etienne [phone]) on 08/23/2020 7:34:11 AM   Radiology CT HEAD WO CONTRAST  Result Date: 08/21/2020 CLINICAL DATA:  Headache EXAM: CT HEAD WITHOUT CONTRAST TECHNIQUE: Contiguous axial images were obtained from the base of the skull through the vertex without intravenous contrast. COMPARISON:  None. FINDINGS: Brain: No acute territorial infarction, hemorrhage or intracranial mass. Mild atrophy. Patchy white matter hypodensity consistent with chronic small vessel ischemic change. Nonenlarged ventricles. Vascular: No hyperdense  vessels.  Carotid vascular calcification Skull: Normal. Negative for fracture or focal lesion. Sinuses/Orbits: Mucosal thickening in the sphenoid sinuses Other: None IMPRESSION: 1. No CT evidence for acute intracranial abnormality. Atrophy and mild chronic small vessel ischemic change of the white matter 2. Sinus disease Electronically Signed   By: Donavan Foil M.D.   On: 08/21/2020 18:13    Procedures Procedures   Medications Ordered in ED Medications  sodium chloride flush (NS) 0.9 % injection 3 mL (has no administration in time range)    ED Course  I have reviewed the triage vital signs and the nursing notes.  Pertinent labs & imaging results that were available during my care of the patient were reviewed by me and considered in my medical decision making (see chart for details).  Clinical Course as of 09/02/20 0740  Thu Aug 21, 2020  2029 Consult; Reviewed with Dr. Rory Percy.  CT angio head and neck and MRI.  He will see in consult.  Recommends admission for stroke work-up. [MP]  2107 Consult: Internal medicine teaching service for admission. [MP]    Clinical Course User Index [MP] Charlesetta Shanks, MD   MDM Rules/Calculators/A&P                          Patient presents as outlined.  I have reviewed the case with neurology.  Recommendations for CT angiogram head and neck and MRI.  Patient will be admitted for stroke work-up.  Patient is alert and well in appearance.  No motor deficits and no cognitive deficits.  Patient will be admitted to internal medicine teaching service. Final Clinical Impression(s) / ED Diagnoses Final diagnoses:  Visual disturbance    Rx / DC Orders ED Discharge Orders    None       Charlesetta Shanks, MD 09/02/20 [phone]

## 2020-08-21 NOTE — ED Provider Notes (Signed)
Emergency Medicine Provider Triage Evaluation Note  Donna Lawson , a 75 y.o. female  was evaluated in triage.  Pt complains of visual changes.  Pt sent here by Opthalmology for evaluation.  Dr. Manuella Ghazi advised stroke work up and sed rate and crp.  He was concerned for retinal artery occlusion/ giant cell arteritis/stroke  Review of Systems  Positive: Visual change Negative: weakness  Physical Exam  There were no vitals taken for this visit. Gen:   Awake, no distress   Resp:  Normal effort  MSK:   Moves extremities without difficulty  Other:    Medical Decision Making  Medically screening exam initiated at 4:47 PM.  Appropriate orders placed.  Donna Lawson was informed that the remainder of the evaluation will be completed by another provider, this initial triage assessment does not replace that evaluation, and the importance of remaining in the ED until their evaluation is complete.     Fransico Meadow, PA-C 08/21/20 1650    Charlesetta Shanks, MD 09/02/20 [phone]

## 2020-08-21 NOTE — H&P (Deleted)
Date: 08/22/2020               Patient Name:  Donna Lawson MRN: [id number]  DOB: 06-06-1945 Age / Sex: 75 y.o., female   PCP: Gaylan Gerold, DO         Medical Service: Internal Medicine Teaching Service         Attending Physician: Dr. Joni Reining, MD    First Contact: Dr. Iona Beard, MD Pager: [phone]  Second Contact: Dr. Maudie Mercury, MD Pager: [phone]       After Hours (After 5p/  First Contact Pager: [phone]  weekends / holidays): Second Contact Pager: [phone]   Chief Complaint: Partial vision loss  History of Present Illness: Donna Lawson is a 74 year old woman with past medical history significant for hypertension, hyperlipidemia (intolerant of statins), frequent PACs, chronic hypokalemia, and anxiety who presented to [facility] from ophthalmologist office for evaluation of partial vision loss.  Patient reports that two mornings prior to arrival while watching television at home, she had the sudden onset of painless partial vision loss in her left eye which she describes as "looking through a slit." Patient states that this resolved approximately five minutes later and there were no further associated symptoms. Later that evening at 730PM, patient had the recurrence of the same visual deficit without additional symptoms. She denies eye pain, tearing, discharge, redness, swelling, or itching. She has no right sided visual deficits. She denies any further weakness or sensory changes. She called multiple ophthalmology practices on Wednesday and was unable to be seen until this morning. 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   Of note, patient endorses pain with chewing for the past  month and a half. She denies associated headaches or temporal pain. She does endorse having weakness in her neck upon awakening in the morning but denies any symptoms of pain or weakness in her shoulders or lower extremities. She states that she has been eating and drinking well at home recently which has improved following her cholecystectomy approximately 1.5 months prior.  ED Course: On arrival to the ED, patient hypertensive to 168/82, but otherwise hemodynamically stable and saturating well on room air. EKG revealed frequent multifocal PACs with heart rate of 84. Labs collected with CMP revealing sodium 133, potassium 2.7, creatinine 1.34; ESR minimally elevated to 28; CRP within normal limits; CBC with diff, INR, and APTT unremarkable. CT Head WO Contrast showed no acute intracranial abnormality, however atrophy and mild chronic vessel ichemic changes of white matter. CTA Head and Neck as well as MRI Brain ordered. Neurology was consulted and IMTS was contacted for admission.  Medications: Acetaminophen 500mg  every six hours PRN Albuterol 2 puffs every six hours PRN Alprazolam 0.5mg  nightly PRN Amlodipine 10mg  daily Carvedilol 3.125mg  twice daily Oxycodone 5mg  every six hours PRN Polyethylene glycol 17g daily PRN Potassium chloride 68mEq daily  Allergies: Allergies as of 08/21/2020 - Review Complete 08/21/2020  Allergen Reaction Noted  . Atorvastatin  03/10/2020  . Cefuroxime axetil  03/10/2020  . Crestor [rosuvastatin calcium]  03/10/2020  . Lisinopril  05/16/2012  . Oxycodone-acetaminophen  03/10/2020   Past Medical History:  Diagnosis Date  . Anxiety   . Arthritis   . Diverticula, colon 1999  . Heart murmur   .  History of blood transfusion   . Hyperlipidemia   . Hypertension   . Osteoarthritis of knee    right  . PONV (postoperative nausea and vomiting)    Family History: Family History  Problem Relation Age of Onset  . Arthritis Mother   . Arthritis Father   .  Hypertension Other    Social History:  Patient lives with her pet [place] in [place]. Her husband passed away approximately twelve years ago. She denies tobacco, alcohol or drug use.   Review of Systems: A complete ROS was negative except as per HPI.  Physical Exam: Blood pressure (!) 155/71, pulse 87, temperature 98.4 F (36.9 C), resp. rate 20, SpO2 95 %. Physical Exam Vitals and nursing note reviewed. Exam conducted with a chaperone present.  Constitutional:      General: She is not in acute distress.    Appearance: She is normal weight. She is not ill-appearing.  HENT:     Head: Normocephalic and atraumatic.     Comments: No tenderness to palpation of the bilateral temples or mandible    Mouth/Throat:     Mouth: Mucous membranes are moist.     Pharynx: Oropharynx is clear.  Eyes:     Extraocular Movements: Extraocular movements intact.     Conjunctiva/sclera: Conjunctivae normal.     Comments: Dilated pupils bilaterally minimally reactive to light  Cardiovascular:     Rate and Rhythm: Normal rate. Rhythm irregular.     Pulses: Normal pulses.     Heart sounds: Normal heart sounds.  Pulmonary:     Effort: Pulmonary effort is normal.     Breath sounds: Normal breath sounds.  Abdominal:     General: Abdomen is flat. Bowel sounds are normal.     Palpations: Abdomen is soft.     Tenderness: There is no abdominal tenderness.  Musculoskeletal:        General: Normal range of motion.     Cervical back: Normal range of motion and neck supple. No tenderness.     Right lower leg: No edema.     Left lower leg: No edema.  Skin:    General: Skin is warm and dry.     Capillary Refill: Capillary refill takes less than 2 seconds.  Neurological:     Mental Status: She is alert and oriented to person, place, and time.     Sensory: No sensory deficit.     Motor: No weakness.     Comments: Left eye peripheral vision loss, no further cranial nerve deficits  Psychiatric:         Mood and Affect: Mood normal.        Behavior: Behavior normal.        Thought Content: Thought content normal.        Judgment: Judgment normal.   EKG: personally reviewed my interpretation is multifocal PACs with heart rate of 84.  Assessment & Plan by Problem: Active Problems:   Retinal artery occlusion  Donna Lawson is a 75 year old woman with past medical history significant for hypertension, hyperlipidemia, frequent PACs, chronic hypokalemia, recent cholecystectomy, and anxiety who presented to [facility] from ophthalmologist office for evaluation of partial vision loss found to have branch retinal artery occlusion.  #Left eye branch retinal artery occlusion Patient presenting following two day history of unilateral, painless, peripheral vision loss that occurred suddenly. Patient was evaluated by ophthalmology office today with recommendation to present to the emergency department due to unilateral optic disc edema of the left eye  and likely superior and inferior branch retinal artery occlusion most concerning for giant cell arteritis versus embolic source. Patient's inflammatory markers upon admission only reveal a mildly elevated ESR to 28, otherwise normal CRP. We agree with neurology's recommendation to hold off on initiating steroids while patient is undergoing further laboratory and radiologic workup. -Neurology consulted, appreciate recommendations  -CTA Head and Neck  -MRI Brain without contrast  -Telemetry  -Echocardiogram  -Hemoglobin A1c  -Lipid panel (has been unable to tolerate various statins)  -Aspirin 325  -PT/OT/SLP  -Bedside swallow  -Hold off on starting steroids while performing workup as described above  #Frequent PACs, chronic Patient has a history of frequent PACs for which she was seen by cardiologist on 06/17/2020. CHA2DS2-VASc score of 4 and therefore she was not started on anticoagulation. Patient was scheduled for an echocardiogram with plan to wear a  monitor, however she did not attend her follow-up appointment which was around her cholecystectomy. Her PACs may be related to electrolyte derangements including hypokalemia with potassium of 2.7. Patient's frequent PACs raise the concern of a possible cardioembolic source for her partial vision loss. -Telemetry -Echocardiogram complete ordered -Continue home carvedilol 3.125mg  twice daily -Repleting potassium -BMP tomorrow morning -Check Mg  #HLD, chronic Patient has history of intolerance to multiple statins including taking a half tablet twice daily. She endorsed significant myalgias with the medication. She states that she has enough statin medications at home to start a pharmacy. Patient would likely benefit from a trial of either infrequent dosing of a statin versus PCSK9 inhibitor. -Continue discussing with patient tolerable treatment plan   #Hypertension, chronic Patient has a history of hypertension for which she is prescribed amlodipine 10mg  daily and carvedilol 3.125mg  twice daily. Blood pressure following admission has remained persistently elevated to as high as 177/92.  -No need for permissive hypertension -Restart carvedilol 3.125mg  twice daily -Restart amlodipine 10mg  daily  #Chronic kidney disease 3b Patient's baseline creatinine appears to be approximately 1.0-1.3. Creatinine on admission of 1.34, stable from three weeks prior. Patient does have associated hypokalemia which appears to be chronic. -BMP tomorrow morning -Repleting potassium -Mg and Phos now  #Anxiety, chronic -Restart home alprazolam 0.5mg  nightly PRN  #Diet: Heart Healthy #VTE ppx: Enoxaparin 40mg  daily #IVF: None #Code status: DNR #Bowel regimen: None  Dispo: Admit patient to Inpatient with expected length of stay of at least 2 midnights.  Signed: Cato Mulligan, MD 08/22/2020, 2:36 AM  Pager: [phone] After 5pm on weekdays and 1pm on weekends: On Call pager: [phone]

## 2020-08-22 ENCOUNTER — Encounter (HOSPITAL_COMMUNITY): Payer: Self-pay | Admitting: Internal Medicine

## 2020-08-22 ENCOUNTER — Inpatient Hospital Stay (HOSPITAL_COMMUNITY): Payer: Medicare HMO

## 2020-08-22 ENCOUNTER — Other Ambulatory Visit: Payer: Self-pay

## 2020-08-22 DIAGNOSIS — E785 Hyperlipidemia, unspecified: Secondary | ICD-10-CM

## 2020-08-22 DIAGNOSIS — I6389 Other cerebral infarction: Secondary | ICD-10-CM

## 2020-08-22 DIAGNOSIS — I10 Essential (primary) hypertension: Secondary | ICD-10-CM

## 2020-08-22 DIAGNOSIS — F419 Anxiety disorder, unspecified: Secondary | ICD-10-CM

## 2020-08-22 DIAGNOSIS — I6522 Occlusion and stenosis of left carotid artery: Secondary | ICD-10-CM

## 2020-08-22 DIAGNOSIS — H34232 Retinal artery branch occlusion, left eye: Secondary | ICD-10-CM | POA: Diagnosis not present

## 2020-08-22 DIAGNOSIS — E876 Hypokalemia: Secondary | ICD-10-CM

## 2020-08-22 DIAGNOSIS — D649 Anemia, unspecified: Secondary | ICD-10-CM

## 2020-08-22 DIAGNOSIS — H349 Unspecified retinal vascular occlusion: Secondary | ICD-10-CM | POA: Diagnosis not present

## 2020-08-22 DIAGNOSIS — N1832 Chronic kidney disease, stage 3b: Secondary | ICD-10-CM

## 2020-08-22 LAB — LIPID PANEL
Cholesterol: 213 mg/dL — ABNORMAL HIGH (ref 0–200)
HDL: 60 mg/dL (ref >40)
LDL Cholesterol: 133 mg/dL — ABNORMAL HIGH (ref 0–99)
Total CHOL/HDL Ratio: 3.6 RATIO
Triglycerides: 100 mg/dL (ref <150)
VLDL: 20 mg/dL (ref 0–40)

## 2020-08-22 LAB — NA AND K (SODIUM & POTASSIUM), RAND UR
Potassium Urine: 29 mmol/L
Sodium, Ur: 54 mmol/L

## 2020-08-22 LAB — URINALYSIS, ROUTINE W REFLEX MICROSCOPIC
Bilirubin Urine: NEGATIVE
Glucose, UA: NEGATIVE mg/dL
Hgb urine dipstick: NEGATIVE
Ketones, ur: NEGATIVE mg/dL
Leukocytes,Ua: NEGATIVE
Nitrite: NEGATIVE
Protein, ur: NEGATIVE mg/dL
Specific Gravity, Urine: 1.045 — ABNORMAL HIGH (ref 1.005–1.030)
pH: 6 (ref 5.0–8.0)

## 2020-08-22 LAB — ECHOCARDIOGRAM COMPLETE
AR max vel: 1.74 cm2
AV Area VTI: 1.3 cm2
AV Area mean vel: 1.75 cm2
AV Mean grad: 6 mmHg
AV Peak grad: 13.2 mmHg
Ao pk vel: 1.82 m/s
Area-P 1/2: 2.2 cm2
S' Lateral: 2.6 cm
Single Plane A4C EF: 56.8 %

## 2020-08-22 LAB — BASIC METABOLIC PANEL
Anion gap: 7 (ref 5–15)
BUN: 10 mg/dL (ref 8–23)
CO2: 28 mmol/L (ref 22–32)
Calcium: 9.4 mg/dL (ref 8.9–10.3)
Chloride: 103 mmol/L (ref 98–111)
Creatinine, Ser: 1.35 mg/dL — ABNORMAL HIGH (ref 0.44–1.00)
GFR, Estimated: 41 mL/min — ABNORMAL LOW (ref >60)
Glucose, Bld: 123 mg/dL — ABNORMAL HIGH (ref 70–99)
Potassium: 2.9 mmol/L — ABNORMAL LOW (ref 3.5–5.1)
Sodium: 138 mmol/L (ref 135–145)

## 2020-08-22 LAB — MAGNESIUM: Magnesium: 1.8 mg/dL (ref 1.7–2.4)

## 2020-08-22 LAB — CBC
HCT: 36.2 % (ref 36.0–46.0)
Hemoglobin: 11.7 g/dL — ABNORMAL LOW (ref 12.0–15.0)
MCH: 24.5 pg — ABNORMAL LOW (ref 26.0–34.0)
MCHC: 32.3 g/dL (ref 30.0–36.0)
MCV: 75.7 fL — ABNORMAL LOW (ref 80.0–100.0)
Platelets: 272 10*3/uL (ref 150–400)
RBC: 4.78 MIL/uL (ref 3.87–5.11)
RDW: 17.1 % — ABNORMAL HIGH (ref 11.5–15.5)
WBC: 6.2 10*3/uL (ref 4.0–10.5)
nRBC: 0 % (ref 0.0–0.2)

## 2020-08-22 LAB — SARS CORONAVIRUS 2 (TAT 6-24 HRS): SARS Coronavirus 2: NEGATIVE

## 2020-08-22 LAB — PHOSPHORUS: Phosphorus: 2.9 mg/dL (ref 2.5–4.6)

## 2020-08-22 LAB — IRON AND TIBC
Iron: 39 ug/dL (ref 28–170)
Saturation Ratios: 17 % (ref 10.4–31.8)
TIBC: 230 ug/dL — ABNORMAL LOW (ref 250–450)
UIBC: 191 ug/dL

## 2020-08-22 LAB — CREATININE, URINE, RANDOM: Creatinine, Urine: 99.67 mg/dL

## 2020-08-22 LAB — HEMOGLOBIN A1C
Hgb A1c MFr Bld: 5.2 % (ref 4.8–5.6)
Mean Plasma Glucose: 102.54 mg/dL

## 2020-08-22 LAB — PROTEIN, URINE, RANDOM: Total Protein, Urine: 24 mg/dL

## 2020-08-22 LAB — FERRITIN: Ferritin: 95 ng/mL (ref 11–307)

## 2020-08-22 MED ORDER — AMLODIPINE BESYLATE 10 MG PO TABS
10.0000 mg | ORAL_TABLET | Freq: Every day | ORAL | Status: DC
  Administered 2020-08-22 – 2020-08-24 (×3): 10 mg via ORAL
  Filled 2020-08-22: qty 1
  Filled 2020-08-22: qty 2
  Filled 2020-08-22: qty 1

## 2020-08-22 MED ORDER — CLOPIDOGREL BISULFATE 75 MG PO TABS
75.0000 mg | ORAL_TABLET | Freq: Every day | ORAL | Status: DC
  Administered 2020-08-22 – 2020-08-24 (×3): 75 mg via ORAL
  Filled 2020-08-22 (×3): qty 1

## 2020-08-22 MED ORDER — ATORVASTATIN CALCIUM 40 MG PO TABS
40.0000 mg | ORAL_TABLET | Freq: Every day | ORAL | Status: DC
  Administered 2020-08-22 – 2020-08-24 (×3): 40 mg via ORAL
  Filled 2020-08-22 (×3): qty 1

## 2020-08-22 MED ORDER — ASPIRIN 325 MG PO TABS
325.0000 mg | ORAL_TABLET | Freq: Once | ORAL | Status: AC
  Administered 2020-08-22: 325 mg via ORAL
  Filled 2020-08-22: qty 1

## 2020-08-22 MED ORDER — POTASSIUM CHLORIDE CRYS ER 20 MEQ PO TBCR: 40.0000 meq | EXTENDED_RELEASE_TABLET | Freq: Two times a day (BID) | ORAL | Status: DC

## 2020-08-22 MED ORDER — ASPIRIN EC 81 MG PO TBEC
81.0000 mg | DELAYED_RELEASE_TABLET | Freq: Every day | ORAL | Status: DC
  Administered 2020-08-23 – 2020-08-24 (×2): 81 mg via ORAL
  Filled 2020-08-22 (×3): qty 1

## 2020-08-22 NOTE — Progress Notes (Signed)
2D echocardiogram completed.  08/22/2020 12:54 PM Kelby Aline., MHA, RVT, RDCS, RDMS

## 2020-08-22 NOTE — H&P (View-Only) (Signed)
Hospital Consult    Reason for Consult: Left eye vision loss; 70% stenosis of left carotid bulb Requesting Physician: TRH MRN #:  [id number]  History of Present Illness: This is a 75 y.o. female with past medical history significant for hypertension, hyperlipidemia, frequent PACs not anticoagulated who was sent to the emergency department by her ophthalmologist due to partial vision loss of left eye.  Work-up included MRI brain which was negative for acute CVA.  CTA neck demonstrated 70% of the left carotid bulb.  She denies any further strokelike symptoms including slurring speech or right-sided weakness.  ESR and CRP labs are relatively normal.  Work-up also included echocardiogram which is pending.  She is a former tobacco user.  She denies claudication, rest pain, or nonhealing wounds of bilateral lower extremities.  Past Medical History:  Diagnosis Date  . Anxiety   . Arthritis   . Diverticula, colon 1999  . Heart murmur   . History of blood transfusion   . Hyperlipidemia   . Hypertension   . Osteoarthritis of knee    right  . PONV (postoperative nausea and vomiting)     Past Surgical History:  Procedure Laterality Date  . ABDOMINAL SURGERY    . CHOLECYSTECTOMY N/A 07/01/2020   Procedure: LAPAROSCOPIC CHOLECYSTECTOMY;  Surgeon: Toth, Paul III, MD;  Location: [facility] OR;  Service: General;  Laterality: N/A;  . COLONOSCOPY    . INTRAOPERATIVE CHOLANGIOGRAM N/A 07/01/2020   Procedure: INTRAOPERATIVE CHOLANGIOGRAM;  Surgeon: Toth, Paul III, MD;  Location: [facility] OR;  Service: General;  Laterality: N/A;  . NO PAST SURGERIES      Allergies  Allergen Reactions  . Atorvastatin Other (See Comments)    Other reaction(s): myalgias  . Crestor [Rosuvastatin Calcium] Other (See Comments)    Other reaction(s): myalgias  . Lisinopril Swelling    Tongue swelling  . Cefuroxime Axetil Other (See Comments)    Other reaction(s): not feel well  . Oxycodone-Acetaminophen Other (See Comments)     Other reaction(s): feel weird    Prior to Admission medications   Medication Sig Start Date End Date Taking? Authorizing Provider  acetaminophen (TYLENOL) 500 MG tablet Take 500 mg by mouth every 6 (six) hours as needed for moderate pain or headache.   Yes [provider]  albuterol (VENTOLIN HFA) 108 (90 Base) MCG/ACT inhaler Inhale 2 puffs into the lungs every 6 (six) hours as needed for wheezing or shortness of breath. 04/29/20  Yes Dewald, Jonathan B, MD  ALPRAZolam (XANAX) 0.5 MG tablet Take 1 tablet (0.5 mg total) at bedtime as needed by mouth for anxiety or sleep. 02/25/17  Yes Nche, Charlotte Lum, NP  amLODipine (NORVASC) 10 MG tablet Take 1 tablet (10 mg total) daily by mouth. 02/25/17  Yes Nche, Charlotte Lum, NP  carvedilol (COREG) 3.125 MG tablet Take 1 tablet (3.125 mg total) by mouth 2 (two) times daily with a meal. 07/03/20  Yes Meuth, Brooke A, PA-C  polyethylene glycol (MIRALAX / GLYCOLAX) 17 g packet Take 17 g by mouth daily as needed for mild constipation. 07/03/20  Yes Meuth, Brooke A, PA-C  oxyCODONE (OXY IR/ROXICODONE) 5 MG immediate release tablet Take 1 tablet (5 mg total) by mouth every 6 (six) hours as needed for severe pain. 07/03/20   Meuth, Brooke A, PA-C  potassium chloride (KLOR-CON) 20 MEQ packet Take 20 mEq by mouth daily for 21 days. 07/16/20 08/06/20  Braswell, Phillip, MD  lisinopril-hydrochlorothiazide (PRINZIDE,ZESTORETIC) 20-25 MG per tablet Take 1 tablet by mouth 2 (two)   times daily. Take 1 by mouth daily 03/17/12 05/10/12  [provider]    Social History   Socioeconomic History  . Marital status: Widowed    Spouse name: Not on file  . Number of children: Not on file  . Years of education: Not on file  . Highest education level: Not on file  Occupational History  . Not on file  Tobacco Use  . Smoking status: Former Smoker  . Smokeless tobacco: Never Used  Substance and Sexual Activity  . Alcohol use: No    Alcohol/week: 0.0 standard  drinks  . Drug use: No  . Sexual activity: Not on file  Other Topics Concern  . Not on file  Social History Narrative   Widowed, lives alone with 2 dogs   Social Determinants of Health   Financial Resource Strain: Not on file  Food Insecurity: Not on file  Transportation Needs: Not on file  Physical Activity: Not on file  Stress: Not on file  Social Connections: Not on file  Intimate Partner Violence: Not on file     Family History  Problem Relation Age of Onset  . Arthritis Mother   . Arthritis Father   . Hypertension Other     ROS: Otherwise negative unless mentioned in HPI  Physical Examination  Vitals:   08/22/20 1315 08/22/20 1330  BP: (!) 142/119 (!) 148/116  Pulse: 89 (!) 48  Resp: 11 (!) 21  Temp:    SpO2: 96% 95%   There is no height or weight on file to calculate BMI.  General:  WDWN in NAD Gait: Not observed HENT: WNL, normocephalic Pulmonary: normal non-labored breathing Cardiac: regular with ectopy Abdomen:  soft, NT/ND, no masses Skin: without rashes Vascular Exam/Pulses: Symmetrical radial pulses and ATA pulses Extremities: without ischemic changes, without Gangrene , without cellulitis; without open wounds;  Musculoskeletal: no muscle wasting or atrophy  Neurologic: A&O X 3; partial vision loss left eye otherwise cranial nerves are grossly intact Psychiatric:  The pt has Normal affect. Lymph:  Unremarkable  CBC    Component Value Date/Time   WBC 6.2 08/22/2020 1330   RBC 4.78 08/22/2020 1330   HGB 11.7 (L) 08/22/2020 1330   HGB 12.0 06/18/2020 1038   HCT 36.2 08/22/2020 1330   HCT 37.7 06/18/2020 1038   PLT 272 08/22/2020 1330   PLT 393 06/18/2020 1038   MCV 75.7 (L) 08/22/2020 1330   MCV 73 (L) 06/18/2020 1038   MCH 24.5 (L) 08/22/2020 1330   MCHC 32.3 08/22/2020 1330   RDW 17.1 (H) 08/22/2020 1330   RDW 16.7 (H) 06/18/2020 1038   LYMPHSABS 1.5 08/21/2020 1647   LYMPHSABS 1.5 06/18/2020 1038   MONOABS 0.4 08/21/2020 1647    EOSABS 0.1 08/21/2020 1647   EOSABS 0.2 06/18/2020 1038   BASOSABS 0.1 08/21/2020 1647   BASOSABS 0.1 06/18/2020 1038    BMET    Component Value Date/Time   NA 134 (L) 08/21/2020 1735   NA 137 07/31/2020 1153   K 2.8 (L) 08/21/2020 1735   CL 96 (L) 08/21/2020 1735   CO2 26 08/21/2020 1647   GLUCOSE 122 (H) 08/21/2020 1735   BUN 11 08/21/2020 1735   BUN 11 07/31/2020 1153   CREATININE 1.30 (H) 08/21/2020 1735   CALCIUM 9.6 08/21/2020 1647   GFRNONAA 41 (L) 08/21/2020 1647   GFRAA >60 07/03/2019 1430    COAGS: Lab Results  Component Value Date   INR 1.1 08/21/2020     Non-Invasive Vascular   Imaging:   MR brain negative for acute CVA  CTA neck demonstrating 70% stenosis of left carotid bulb   ASSESSMENT/PLAN: This is a 75 y.o. female with partial vision loss left eye  -Patient has persistent partial vision loss of left eye however denies any further strokelike symptoms including slurring speech or right-sided weakness -There was concern for GCA however ESR is only slightly elevated, CRP is normal, and she is without any headaches -Possible etiology could include cardioembolism due to frequent PACs however she was recently evaluated by cardiology and based on their work-up did not feel like she was indicated for anticoagulation -CTA neck does demonstrate 70% stenosis of left carotid bulb; agree with addition of Plavix and aspirin.  Patient would likely benefit from revascularization of carotid artery. -On-call vascular surgeon Dr. Esther Bradstreet will evaluate the patient later today and provide further treatment plans   Matthew Eveland PA-C [facility] [phone]  Agree with above. Has upper and lower left eye field cut.  Fairly high carotid bifurcation with calcific stenosis with retinal embolus.  Pt has only left eye symptoms no other motor sensory or speech deficit.    Anatomy is probably most suitable for left side TCAR.   Needs to be on Plavix Aspirin  Statin Will plan for left TCAR stent next Wednesday. Risk benefits complications procedure details d/w pt and family.  She wishes to proceed. She needs better BP control preferably SBP less than 150 and DBP less than 90  She can go home and come back from our standpoint. Our office will contact her regarding times for next week.  Bernis Schreur, MD [facility]  Office: [phone]   

## 2020-08-22 NOTE — ED Notes (Signed)
Echo at bedside

## 2020-08-22 NOTE — Consult Note (Addendum)
Hospital Consult    Reason for Consult: Left eye vision loss; 70% stenosis of left carotid bulb Requesting Physician: TRH MRN #:  [id number]  History of Present Illness: This is a 75 y.o. female with past medical history significant for hypertension, hyperlipidemia, frequent PACs not anticoagulated who was sent to the emergency department by her ophthalmologist due to partial vision loss of left eye.  Work-up included MRI brain which was negative for acute CVA.  CTA neck demonstrated 70% of the left carotid bulb.  She denies any further strokelike symptoms including slurring speech or right-sided weakness.  ESR and CRP labs are relatively normal.  Work-up also included echocardiogram which is pending.  She is a former tobacco user.  She denies claudication, rest pain, or nonhealing wounds of bilateral lower extremities.  Past Medical History:  Diagnosis Date  . Anxiety   . Arthritis   . Diverticula, colon 1999  . Heart murmur   . History of blood transfusion   . Hyperlipidemia   . Hypertension   . Osteoarthritis of knee    right  . PONV (postoperative nausea and vomiting)     Past Surgical History:  Procedure Laterality Date  . ABDOMINAL SURGERY    . CHOLECYSTECTOMY N/A 07/01/2020   Procedure: LAPAROSCOPIC CHOLECYSTECTOMY;  Surgeon: Toth, Paul III, MD;  Location: [facility] OR;  Service: General;  Laterality: N/A;  . COLONOSCOPY    . INTRAOPERATIVE CHOLANGIOGRAM N/A 07/01/2020   Procedure: INTRAOPERATIVE CHOLANGIOGRAM;  Surgeon: Toth, Paul III, MD;  Location: [facility] OR;  Service: General;  Laterality: N/A;  . NO PAST SURGERIES      Allergies  Allergen Reactions  . Atorvastatin Other (See Comments)    Other reaction(s): myalgias  . Crestor [Rosuvastatin Calcium] Other (See Comments)    Other reaction(s): myalgias  . Lisinopril Swelling    Tongue swelling  . Cefuroxime Axetil Other (See Comments)    Other reaction(s): not feel well  . Oxycodone-Acetaminophen Other (See Comments)     Other reaction(s): feel weird    Prior to Admission medications   Medication Sig Start Date End Date Taking? Authorizing Provider  acetaminophen (TYLENOL) 500 MG tablet Take 500 mg by mouth every 6 (six) hours as needed for moderate pain or headache.   Yes [provider]  albuterol (VENTOLIN HFA) 108 (90 Base) MCG/ACT inhaler Inhale 2 puffs into the lungs every 6 (six) hours as needed for wheezing or shortness of breath. 04/29/20  Yes Dewald, Jonathan B, MD  ALPRAZolam (XANAX) 0.5 MG tablet Take 1 tablet (0.5 mg total) at bedtime as needed by mouth for anxiety or sleep. 02/25/17  Yes Nche, Charlotte Lum, NP  amLODipine (NORVASC) 10 MG tablet Take 1 tablet (10 mg total) daily by mouth. 02/25/17  Yes Nche, Charlotte Lum, NP  carvedilol (COREG) 3.125 MG tablet Take 1 tablet (3.125 mg total) by mouth 2 (two) times daily with a meal. 07/03/20  Yes Meuth, Brooke A, PA-C  polyethylene glycol (MIRALAX / GLYCOLAX) 17 g packet Take 17 g by mouth daily as needed for mild constipation. 07/03/20  Yes Meuth, Brooke A, PA-C  oxyCODONE (OXY IR/ROXICODONE) 5 MG immediate release tablet Take 1 tablet (5 mg total) by mouth every 6 (six) hours as needed for severe pain. 07/03/20   Meuth, Brooke A, PA-C  potassium chloride (KLOR-CON) 20 MEQ packet Take 20 mEq by mouth daily for 21 days. 07/16/20 08/06/20  Braswell, Phillip, MD  lisinopril-hydrochlorothiazide (PRINZIDE,ZESTORETIC) 20-25 MG per tablet Take 1 tablet by mouth 2 (two)   times daily. Take 1 by mouth daily 03/17/12 05/10/12  [provider]    Social History   Socioeconomic History  . Marital status: Widowed    Spouse name: Not on file  . Number of children: Not on file  . Years of education: Not on file  . Highest education level: Not on file  Occupational History  . Not on file  Tobacco Use  . Smoking status: Former Smoker  . Smokeless tobacco: Never Used  Substance and Sexual Activity  . Alcohol use: No    Alcohol/week: 0.0 standard  drinks  . Drug use: No  . Sexual activity: Not on file  Other Topics Concern  . Not on file  Social History Narrative   Widowed, lives alone with 2 dogs   Social Determinants of Health   Financial Resource Strain: Not on file  Food Insecurity: Not on file  Transportation Needs: Not on file  Physical Activity: Not on file  Stress: Not on file  Social Connections: Not on file  Intimate Partner Violence: Not on file     Family History  Problem Relation Age of Onset  . Arthritis Mother   . Arthritis Father   . Hypertension Other     ROS: Otherwise negative unless mentioned in HPI  Physical Examination  Vitals:   08/22/20 1315 08/22/20 1330  BP: (!) 142/119 (!) 148/116  Pulse: 89 (!) 48  Resp: 11 (!) 21  Temp:    SpO2: 96% 95%   There is no height or weight on file to calculate BMI.  General:  WDWN in NAD Gait: Not observed HENT: WNL, normocephalic Pulmonary: normal non-labored breathing Cardiac: regular with ectopy Abdomen:  soft, NT/ND, no masses Skin: without rashes Vascular Exam/Pulses: Symmetrical radial pulses and ATA pulses Extremities: without ischemic changes, without Gangrene , without cellulitis; without open wounds;  Musculoskeletal: no muscle wasting or atrophy  Neurologic: A&O X 3; partial vision loss left eye otherwise cranial nerves are grossly intact Psychiatric:  The pt has Normal affect. Lymph:  Unremarkable  CBC    Component Value Date/Time   WBC 6.2 08/22/2020 1330   RBC 4.78 08/22/2020 1330   HGB 11.7 (L) 08/22/2020 1330   HGB 12.0 06/18/2020 1038   HCT 36.2 08/22/2020 1330   HCT 37.7 06/18/2020 1038   PLT 272 08/22/2020 1330   PLT 393 06/18/2020 1038   MCV 75.7 (L) 08/22/2020 1330   MCV 73 (L) 06/18/2020 1038   MCH 24.5 (L) 08/22/2020 1330   MCHC 32.3 08/22/2020 1330   RDW 17.1 (H) 08/22/2020 1330   RDW 16.7 (H) 06/18/2020 1038   LYMPHSABS 1.5 08/21/2020 1647   LYMPHSABS 1.5 06/18/2020 1038   MONOABS 0.4 08/21/2020 1647    EOSABS 0.1 08/21/2020 1647   EOSABS 0.2 06/18/2020 1038   BASOSABS 0.1 08/21/2020 1647   BASOSABS 0.1 06/18/2020 1038    BMET    Component Value Date/Time   NA 134 (L) 08/21/2020 1735   NA 137 07/31/2020 1153   K 2.8 (L) 08/21/2020 1735   CL 96 (L) 08/21/2020 1735   CO2 26 08/21/2020 1647   GLUCOSE 122 (H) 08/21/2020 1735   BUN 11 08/21/2020 1735   BUN 11 07/31/2020 1153   CREATININE 1.30 (H) 08/21/2020 1735   CALCIUM 9.6 08/21/2020 1647   GFRNONAA 41 (L) 08/21/2020 1647   GFRAA >60 07/03/2019 1430    COAGS: Lab Results  Component Value Date   INR 1.1 08/21/2020     Non-Invasive Vascular   Imaging:   MR brain negative for acute CVA  CTA neck demonstrating 70% stenosis of left carotid bulb   ASSESSMENT/PLAN: This is a 75 y.o. female with partial vision loss left eye  -Patient has persistent partial vision loss of left eye however denies any further strokelike symptoms including slurring speech or right-sided weakness -There was concern for GCA however ESR is only slightly elevated, CRP is normal, and she is without any headaches -Possible etiology could include cardioembolism due to frequent PACs however she was recently evaluated by cardiology and based on their work-up did not feel like she was indicated for anticoagulation -CTA neck does demonstrate 70% stenosis of left carotid bulb; agree with addition of Plavix and aspirin.  Patient would likely benefit from revascularization of carotid artery. -On-call vascular surgeon Dr. Oneida Alar will evaluate the patient later today and provide further treatment plans   Dagoberto Ligas PA-C [facility] [phone]  Agree with above. Has upper and lower left eye field cut.  Fairly high carotid bifurcation with calcific stenosis with retinal embolus.  Pt has only left eye symptoms no other motor sensory or speech deficit.    Anatomy is probably most suitable for left side TCAR.   Needs to be on Plavix Aspirin  Statin Will plan for left TCAR stent next Wednesday. Risk benefits complications procedure details d/w pt and family.  She wishes to proceed. She needs better BP control preferably SBP less than 150 and DBP less than 90  She can go home and come back from our standpoint. Our office will contact her regarding times for next week.  Ruta Hinds, MD [facility] [employer] Office: [phone]

## 2020-08-22 NOTE — Progress Notes (Addendum)
HD#1 Subjective:  Overnight Events: None   Patient examined at bedside. Reports she is feeling well this morning. Was able to sleep well overnight. Continued to have decrease left eye vision but no pain. Does notes some pain the left jaw.   Objective:  Vital signs in last 24 hours: Vitals:   08/22/20 0250 08/22/20 0300 08/22/20 0345 08/22/20 0545  BP: (!) 155/84 (!) 156/143 104/73 128/71  Pulse: 84 (!) 41 (!) 36 69  Resp: (!) 21 17 (!) 26 16  Temp:      SpO2: 95% 94% 94% 96%   Supplemental O2: Room Air SpO2: 96 %   Physical Exam:  Constitutional: Appears well-developed and well-nourished. No distress.  HENT: Normocephalic and atraumatic, EOMI, mild tenderness on palpation to the left mandibular area  Cardiovascular: Normal rate, regular rhythm, S1 and S2 present, no murmurs, rubs, gallops.  Distal pulses intact Respiratory: No respiratory distress, no accessory muscle use.  Effort is normal.  Lungs are clear to auscultation bilaterally. GI: Nondistended, soft, nontender to palpation, normal active bowel sounds Musculoskeletal: Normal bulk and tone.  Neurological: Is alert and oriented x4. Deceased vision of the left eye otherwise without other CN nerve deficits. Normal sensation and strength. Speech is fluid.   Skin: Warm and dry.  No rash, erythema, lesions noted. Psychiatric: Normal mood and affect.   Pertinent Labs: CBC Latest Ref Rng & Units 08/21/2020 08/21/2020 07/02/2020  WBC 4.0 - 10.5 K/uL - 7.6 6.8  Hemoglobin 12.0 - 15.0 g/dL 12.6 12.2 11.0(L)  Hematocrit 36.0 - 46.0 % 37.0 38.1 33.7(L)  Platelets 150 - 400 K/uL - 313 262    CMP Latest Ref Rng & Units 08/21/2020 08/21/2020 07/31/2020  Glucose 70 - 99 mg/dL 122(H) 122(H) 126(H)  BUN 8 - 23 mg/dL $Remove'11 11 11  'mUQfoMG$ Creatinine 0.44 - 1.00 mg/dL 1.30(H) 1.34(H) 1.35(H)  Sodium 135 - 145 mmol/L 134(L) 133(L) 137  Potassium 3.5 - 5.1 mmol/L 2.8(L) 2.7(LL) 3.7  Chloride 98 - 111 mmol/L 96(L) 96(L) 97  CO2 22 - 32 mmol/L - 26 21   Calcium 8.9 - 10.3 mg/dL - 9.6 9.8  Total Protein 6.5 - 8.1 g/dL - 7.1 -  Total Bilirubin 0.3 - 1.2 mg/dL - 1.1 -  Alkaline Phos 38 - 126 U/L - 95 -  AST 15 - 41 U/L - 15 -  ALT 0 - 44 U/L - 9 -   Lipid Panel     Component Value Date/Time   CHOL 213 (H) 08/22/2020 0152   CHOL 259 (H) 07/31/2020 1153   TRIG 100 08/22/2020 0152   HDL 60 08/22/2020 0152   HDL 63 07/31/2020 1153   CHOLHDL 3.6 08/22/2020 0152   VLDL 20 08/22/2020 0152   LDLCALC 133 (H) 08/22/2020 0152   LDLCALC 164 (H) 07/31/2020 1153   LDLDIRECT 166.0 07/01/2016 0905   LABVLDL 32 07/31/2020 1153    Imaging:  CT HEAD WO CONTRAST Result Date: 08/21/2020  IMPRESSION: 1. No CT evidence for acute intracranial abnormality. Atrophy and mild chronic small vessel ischemic change of the white matter 2. Sinus disease Electronically Signed   By: Donavan Foil M.D.   On: 08/21/2020 18:13   CT Angio Neck W and/or Wo Contrast Result Date: 08/22/2020 1. Negative CTA for large vessel occlusion. 2. Atheromatous plaque at the left carotid bulb with associated short-segment stenosis of up to 70% by NASCET criteria. 3. Mild for age atheromatous change elsewhere about the major arterial vasculature of the head and  neck. No other hemodynamically significant or correctable stenosis. 4. Diffuse tortuosity of the major arterial vasculature of the head and neck, suggesting chronic underlying hypertension. 5. Ectatic aortic arch measuring up to 3.8 cm. Recommend annual imaging followup by CTA or MRA. This recommendation follows 2010 ACCF/AHA/AATS/ACR/ASA/SCA/SCAI/SIR/STS/SVM Guidelines for the Diagnosis and Management of Patients with Thoracic Aortic Disease. Circulation.2010; 121: W808-U110. Aortic aneurysm NOS (ICD10-I71.9) 6. 4.6 cm right thyroid nodule. Per history, this has been previously evaluated and biopsied. Please refer to prior imaging/history for any potential recommendations regarding this finding. (ref: J Am Coll Radiol.  2015 Feb;12(2): 143-50).  CT head IMPRESSION: 1. No CT evidence for acute intracranial abnormality. Atrophy and mild chronic small vessel ischemic change of the white matter 2. Sinus disease  Assessment/Plan:   Active Problems:   Retinal artery occlusion   Patient Summary: Donna Lawson is a 75 year old woman with past medical history significant for hypertension, hyperlipidemia, frequent PACs, chronic hypokalemia,recent cholecystectomy, andanxiety who presented to [facility] from ophthalmologist office for evaluation of partial vision loss found to havebranchretinal artery occlusion.  #Left eye branch retinal artery occlusion #Left carotid artery stenosis  Patient continues to have vision loss of the left eye. Found to have unilateral optic disc edema of the left eye and likely superior and inferior branch retinal artery occlusion. CTA with 70% stenosis of the  left carotid bulb. Given findings more likely etiology is embolic although GCA is still a possibility. Per her outpatint ophthalmologist Dr. Alfredia Ferguson, patient also had some atypia of the blood vessels of the eye with concern for possible autoimmune and infectious cause. Suggest workup to rule out TB, syphilis, and autoimmune etiologies. Vascular surgery consulted for carotid stenosis and possible temporal artery biopsy.  -Neurology consulted, appreciate recommendations -Vascular surgery consulted, appreciated recommendations -Aspirin 325 -Ordered ANA, ACE, Lysozyme, C-ANCA, P-ANCA, RPR, Quantiferon Gold, Lyme Titers, HLA-B27, FTA-ABS, and RF -Telemetry -Follow up echocardiogram -PT/OT/SLP  #Frequent PACs, chronic Patient has a history of frequent PACs for which she wasseen by cardiologist on 06/17/2020. CHA2DS2-VASc score of 4 and therefore not on anticoagulation. PACs may be related to electrolyte derangements. Her K of 2.7 on admission. May have possible cardioembolic source of there eye symptoms although carotid stenosis  more likely at this point. -Hold carvedilol in setting of brady cardia to the 40s.  -Reple potassium -Echocardiogram -BMP, Mg - Monitor on telemetry  #HLD, chronic Lipid panel with total cholesterol of 213, triglyceride of 100, HDL 63, and LDL of 133. History of myalgias to statins. Would benefit from starting a PCSK9 inhibitor as an outpatient. -Continue discussing with patient tolerable treatment planto which she will remain adherent  #Hypokalemia, chronic Patient has history of chronic hypokalemia of unclear etiology. Potassium on admissionlow at 2.7. Not on adiuretic and does not endorse symptoms of gastrointestinal losses or inadequate oral intake.  -Follow up BMP, mg, urine Na, K, and Cr, renin activity and aldosterone, and UA  #Hypertension, chronic BP in the 150s this morning. Has not had her home medications since admission. -Start amlodipine 10mg  daily, hold metoprolol due to bradycardia  #Chronickidney disease 3b Patient's baseline creatinine appears to be approximately 1.0-1.3. Creatinine on admission of 1.34, stable from three weeks prior. -Monitor renal function  #Microcytic anemia Patient with microcytosis on CBC with MCV of 75.1. Her hemoglobin in 12.2 on admission, RDW elevated at 16. - Check ferritin and iron panel - Monitor CBC   #Anxiety, chronic -alprazolam 0.5mg  nightly PRN  #Diet:Heart Healthy #VTE RPR:[id number] 40mg  daily #IVF: None #Code status:DNR  Dispo: Anticipated discharge to Home in pending further management.   Iona Beard, MD 08/22/2020, 6:45 AM Pager: [phone]  Please contact the on call pager after 5 pm and on weekends at [phone].

## 2020-08-22 NOTE — ED Notes (Signed)
Patient transported to MRI 

## 2020-08-22 NOTE — Plan of Care (Signed)

## 2020-08-22 NOTE — H&P (Addendum)
Date: 08/22/2020               Patient Name:  Donna Lawson MRN: [id number]  DOB: 1945/10/05 Age / Sex: 75 y.o., female   PCP: Gaylan Gerold, DO         Medical Service: Internal Medicine Teaching Service         Attending Physician: Dr. Lucious Groves, DO    First Contact: Dr. Iona Beard, MD Pager: [phone]  Second Contact: Dr. Maudie Mercury, MD Pager: [phone]       After Hours (After 5p/  First Contact Pager: [phone]  weekends / holidays): Second Contact Pager: [phone]   Chief Complaint: Left eye partial vision loss  History of Present Illness: Donna Lawson is a 75 year old woman with past medical history significant for hypertension, hyperlipidemia (intolerant of statins), frequent PACs, chronic hypokalemia, and anxiety who presented to [facility] from ophthalmologist office for evaluation of partial vision loss.  Patient reports that two mornings prior to arrival while watching television at home, she had the sudden onset of painless partial vision loss in her left eye which she describes as "looking through a slit." Patient states that this resolved approximately five minutes later and there were no further associated symptoms. Later that evening at 730PM, patient had the recurrence of the same visual deficit without additional symptoms. She denies eye pain, tearing, discharge, redness, swelling, or itching. She has no right sided visual deficits. She denies any further weakness or sensory changes. She called multiple ophthalmology practices on Wednesday and was unable to be seen until this morning. 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   Of note, patient endorses pain with chewing  for the past month and a half. She denies associated headaches or temporal pain. She does endorse having weakness in her neck upon awakening in the morning but denies any symptoms of pain or weakness in her shoulders or lower extremities. She states that she has been eating and drinking well at home recently which has improved following her cholecystectomy approximately 1.5 months prior.  ED Course: On arrival to the ED, patient hypertensive to 168/82, but otherwise hemodynamically stable and saturating well on room air. EKG revealed frequent multifocal PACs with heart rate of 84. Labs collected with CMP revealing sodium 133, potassium 2.7, creatinine 1.34; ESR minimally elevated to 28; CRP within normal limits; CBC with diff, INR, and APTT unremarkable. CT Head WO Contrast showed no acute intracranial abnormality, however atrophy and mild chronic vessel ichemic changes of white matter. CTA Head and Neck as well as MRI Brain ordered. Neurology was consulted and IMTS was contacted for admission.  Medications: Current Meds  Medication Sig  . acetaminophen (TYLENOL) 500 MG tablet Take 500 mg by mouth every 6 (six) hours as needed for moderate pain or headache.  . albuterol (VENTOLIN HFA) 108 (90 Base) MCG/ACT inhaler Inhale 2 puffs into the lungs every 6 (six) hours as needed for wheezing or shortness of breath.  . ALPRAZolam (XANAX) 0.5 MG tablet Take 1 tablet (0.5 mg total) at bedtime as needed by mouth for anxiety or sleep.  Marland Kitchen amLODipine (NORVASC) 10 MG tablet Take 1 tablet (10 mg total) daily by mouth.  . carvedilol (COREG) 3.125 MG tablet Take  1 tablet (3.125 mg total) by mouth 2 (two) times daily with a meal.  . polyethylene glycol (MIRALAX / GLYCOLAX) 17 g packet Take 17 g by mouth daily as needed for mild constipation.   Allergies: Allergies as of 08/21/2020 - Review Complete 08/21/2020  Allergen Reaction Noted  . Atorvastatin  03/10/2020  . Cefuroxime axetil  03/10/2020  . Crestor  [rosuvastatin calcium]  03/10/2020  . Lisinopril  05/16/2012  . Oxycodone-acetaminophen  03/10/2020   Past Medical History:  Diagnosis Date  . Anxiety   . Arthritis   . Diverticula, colon 1999  . Heart murmur   . History of blood transfusion   . Hyperlipidemia   . Hypertension   . Osteoarthritis of knee    right  . PONV (postoperative nausea and vomiting)    Family History: No known family history as she was a foster child with no knowledge of her biological parents or siblings.  Social History: Patient lives with her pet Menard in [place]. Her husband passed away approximately twelve years ago. She denies tobacco, alcohol or drug use.   Review of Systems: A complete ROS was negative except as per HPI.  Physical Exam: Blood pressure (!) 155/71, pulse 87, temperature 98.4 F (36.9 C), resp. rate 20, SpO2 95 %. Physical Exam Vitals and nursing note reviewed. Exam conducted with a chaperone present.  Constitutional:      General: She is not in acute distress.    Appearance: She is normal weight. She is not ill-appearing.  HENT:     Head: Normocephalic and atraumatic.     Comments: No tenderness to palpation of the bilateral temples or mandible    Mouth/Throat:     Mouth: Mucous membranes are moist.     Pharynx: Oropharynx is clear.  Eyes:     Extraocular Movements: Extraocular movements intact.     Conjunctiva/sclera: Conjunctivae normal.     Comments: Dilated pupils bilaterally minimally reactive to light  Cardiovascular:     Rate and Rhythm: Normal rate. Rhythm irregular.     Pulses: Normal pulses.     Heart sounds: Normal heart sounds.  Pulmonary:     Effort: Pulmonary effort is normal.     Breath sounds: Normal breath sounds.  Abdominal:     General: Abdomen is flat. Bowel sounds are normal.     Palpations: Abdomen is soft.     Tenderness: There is no abdominal tenderness.  Musculoskeletal:        General: Normal range of motion.     Cervical back:  Normal range of motion and neck supple. No tenderness.     Right lower leg: No edema.     Left lower leg: No edema.  Skin:    General: Skin is warm and dry.     Capillary Refill: Capillary refill takes less than 2 seconds.  Neurological:     Mental Status: She is alert and oriented to person, place, and time.     Sensory: No sensory deficit.     Motor: No weakness.     Comments: Left eye peripheral vision loss, no further cranial nerve deficits  Psychiatric:        Mood and Affect: Mood normal.        Behavior: Behavior normal.        Thought Content: Thought content normal.        Judgment: Judgment normal.   EKG: personally reviewed my interpretation is frequent multifocal PACs with heart rate of 84  Assessment &  Plan by Problem: Active Problems:   Retinal artery occlusion  Donna Lawson is a 75 year old woman with past medical history significant for hypertension, hyperlipidemia, frequent PACs, chronic hypokalemia, recent cholecystectomy, and anxiety who presented to [facility] from ophthalmologist office for evaluation of partial vision loss found to have branch retinal artery occlusion.  #Left eye branch retinal artery occlusion Patient presenting following two day history of unilateral, painless, peripheral vision loss that occurred suddenly. Patient was evaluated by ophthalmology office today with recommendation to present to the emergency department due to unilateral optic disc edema of the left eye and likely superior and inferior branch retinal artery occlusion most concerning for giant cell arteritis versus embolic source. Patient's inflammatory markers upon admission only reveal a mildly elevated ESR to 28, otherwise normal CRP. We agree with neurology's recommendation to hold off on initiating steroids while patient is undergoing further laboratory and radiologic workup. -Neurology consulted, appreciate recommendations  -CTA Head and Neck  -MRI Brain without contrast   -Telemetry  -Echocardiogram  -Hemoglobin A1c  -Lipid panel (has been unable to tolerate various statins)  -Aspirin 325  -PT/OT/SLP  -Bedside swallow  -Hold off on starting steroids or performing biopsy while performing workup as described above  #Frequent PACs, chronic Patient has a history of frequent PACs for which she was seen by cardiologist on 06/17/2020. CHA2DS2-VASc score of 4 and therefore she was not started on anticoagulation. Patient was scheduled for an echocardiogram with plan to wear a monitor, however she did not attend her follow-up appointment which was around her cholecystectomy. Her PACs may be related to electrolyte derangements including hypokalemia with potassium of 2.7. Patient's frequent PACs raise the concern of a possible cardioembolic source for her partial vision loss. -Telemetry -Echocardiogram complete ordered -Continue home carvedilol 3.125mg  twice daily -Repleting potassium -BMP tomorrow morning -Check Mg  #HLD, chronic Patient has history of intolerance to multiple statins including taking a half tablet twice daily. She endorsed significant myalgias with the medication. She states that she has enough statin medications at home to start a pharmacy. Patient would likely benefit from a trial of either infrequent dosing of a statin versus PCSK9 inhibitor. -Continue discussing with patient tolerable treatment plan to which she will remain adherent  #Hypokalemia, chronic Patient has history of chronic hypokalemia of unclear etiology. Potassium on admission low at 2.7. Patient is not currently prescribed a diuretic and does not endorse symptoms of gastrointestinal losses or inadequate oral intake. We will collect urine studies to assess urine potassium-to-creatinine ratio. -Check magnesium -Monitor on repeat BMP -Urine sodium and potassium -Urine creatinine -Urinalysis  #Hypertension, chronic Patient has a history of hypertension for which she is prescribed  amlodipine 10mg  daily and carvedilol 3.125mg  twice daily. Blood pressure following admission has remained persistently elevated to as high as 177/92.  -No need for permissive hypertension -Restart carvedilol 3.125mg  twice daily -Restart amlodipine 10mg  daily  #Chronic kidney disease 3b Patient's baseline creatinine appears to be approximately 1.0-1.3. Creatinine on admission of 1.34, stable from three weeks prior. -BMP tomorrow morning  #Anxiety, chronic -Restart home alprazolam 0.5mg  nightly PRN  #Diet: Heart Healthy #VTE ppx: Enoxaparin 40mg  daily #IVF: None #Code status: DNR #Bowel regimen: None  Dispo: Admit patient to Inpatient with expected length of stay greater than 2 midnights.  Signed: Cato Mulligan, MD 08/22/2020, 2:49 AM  Pager: [phone] After 5pm on weekdays and 1pm on weekends: On Call pager: [phone]

## 2020-08-22 NOTE — Progress Notes (Addendum)
STROKE TEAM PROGRESS NOTE    INTERVAL HISTORY No Acute events Today [name] is reporting continued left vision changes "like the picture has been reduced in size" unchanged since admission. She denies any new symptoms of concern including headache, weakness, numbness, tingling, other vision changes, headache. She has not experienced any word finding or speech difficulties.  Patient tried two statins in the past and discontinued them due to bilat knee pain but admits this pain has been present both with and without the statins. She is agreeable to new statin trial. She states she is a "bad patient" who does not follow through with recommendations at times. She recognizes that this is not in her best interest and would like to improve her compliance overall.  Her husband had several strokes and had to have a CEA and is now deceased. She lives alone and is independent with ADLs.  We discussed her ongoing work up and plan of care including carotid stenosis and need for vascular surgery consult. She was agreeable to this.  Her questions were addressed.   Vitals:   08/22/20 0700 08/22/20 0715 08/22/20 0745 08/22/20 0800  BP: (!) 144/76 (!) 155/108 (!) 158/89 (!) 150/113  Pulse: (!) 38 71 83 86  Resp: _0 Temp:      SpO2: 94% 98% 100% 97%   CBC:  Recent Labs  Lab 08/21/20 1647 08/21/20 1735  WBC 7.6  --   NEUTROABS 5.4  --   HGB 12.2 12.6  HCT 38.1 37.0  MCV 75.1*  --   PLT 313  --    Basic Metabolic Panel:  Recent Labs  Lab 08/21/20 1647 08/21/20 1735  NA 133* 134*  K 2.7* 2.8*  CL 96* 96*  CO2 26  --   GLUCOSE 122* 122*  BUN 11 11  CREATININE 1.34* 1.30*  CALCIUM 9.6  --    Lipid Panel:  Recent Labs  Lab 08/22/20 0152  CHOL 213*  TRIG 100  HDL 60  CHOLHDL 3.6  VLDL 20  LDLCALC 133*   HgbA1c:  Recent Labs  Lab 08/22/20 0152  HGBA1C 5.2   Urine Drug Screen: Pending  Alcohol Level  PERTINENT IMAGING AND DIAGNOSTICS: MRI Brian 1. No definite  acute intracranial abnormality. Vague diffusion abnormality involving the left ventral pons on axial DWI image 66 favored to be artifactual in nature. If there is high clinical suspicion for a possible ischemic infarct at this location, a repeat study with thin section imaging through the brainstem would be suggested for further evaluation. 2. Underlying age-related cerebral atrophy with moderate chronic microvascular ischemic disease. 3. Left sphenoid sinus disease.  CTA Head and Neck 1. Negative CTA for large vessel occlusion. 2. Atheromatous plaque at the left carotid bulb with associated short-segment stenosis of up to 70% by NASCET criteria. 3. Mild for age atheromatous change elsewhere about the major arterial vasculature of the head and neck. No other hemodynamically significant or correctable stenosis. 4. Diffuse tortuosity of the major arterial vasculature of the head and neck, suggesting chronic underlying hypertension. 5. Ectatic aortic arch measuring up to 3.8 cm. Recommend annual imaging followup by CTA or MRA. This recommendation follows 2010 ACCF/AHA/AATS/ACR/ASA/SCA/SCAI/SIR/STS/SVM Guidelines for the Diagnosis and Management of Patients with Thoracic Aortic Disease. Circulation.2010; 121: B147-W295. Aortic aneurysm NOS (ICD10-I71.9) 6. 4.6 cm right thyroid nodule. Per history, this has been previously evaluated and biopsied. Please refer to prior imaging/history for any potential recommendations regarding this Finding.  2D echo Left Ventricle: Left  ventricular ejection fraction, by estimation, is 60  to 65%. The left ventricle has normal function. The left ventricle has no regional wall motion abnormalities. The left ventricular internal cavity size was normal in size. There is  no left ventricular hypertrophy. Left ventricular diastolic parameters  are consistent with Grade I diastolic dysfunction (impaired relaxation).  Right Ventricle: The right ventricular size  is normal. No increase in  right ventricular wall thickness. Right ventricular systolic function is  normal. There is normal pulmonary artery systolic pressure. The tricuspid regurgitant velocity is 1.48 m/s, and  with an assumed right atrial pressure of 3 mmHg, the estimated right ventricular systolic pressure is 93.8 mmHg.  Left Atrium: Left atrial size was normal in size.  Right Atrium: Right atrial size was normal in size.  Pericardium: Trivial pericardial effusion is present. Presence of  pericardial fat pad.  Mitral Valve: The mitral valve is grossly normal. Trivial mitral valve  regurgitation. No evidence of mitral valve stenosis.  Tricuspid Valve: The tricuspid valve is grossly normal. Tricuspid valve regurgitation is not demonstrated. No evidence of tricuspid stenosis.  Aortic Valve: The aortic valve is tricuspid. Aortic valve regurgitation is not visualized. No aortic stenosis is present. Aortic valve mean gradientmeasures 6.0 mmHg. Aortic valve peak gradient measures 13.2 mmHg. Aortic valve area, by VTI measures 1.30 cm.  Pulmonic Valve: The pulmonic valve was grossly normal. Pulmonic valve regurgitation is not visualized. No evidence of pulmonic stenosis.  Aorta: The aortic root is normal in size and structure.  Venous: The inferior vena cava is normal in size with greater than 50% respiratory variability, suggesting right atrial pressure of 3 mmHg.  IAS/Shunts: The atrial septum is grossly normal.   PHYSICAL EXAM GENERAL: Anxious alert elderly female lying on ED stretcher.  HEENT: - Normocephalic and atraumatic LUNGS - No extra work of breathing CARD: Leisure centre manager  ABDOMEN - Soft, nontender, nondistended with normoactive BS Ext: warm, well perfused, intact peripheral pulses, no edema  NEURO:  Mental Status: AA&Ox4 Language: speech is clear.  Naming, repetition, fluency, and comprehension intact. Cranial Nerves: PERRL. EOMI, visual fields full, diminished visual  acuity in the left eye, no facial asymmetry, facial sensation intact, hearing reduced bilaterally, tongue/uvula/soft palate midline, normal sternocleidomastoid and trapezius muscle strength. No evidence of tongue atrophy or fibrillations Motor: 5/5 without drift in all fours Tone: is normal and bulk is normal Sensation- Intact to light touch bilaterally Coordination: FTN intact bilaterally, no ataxia in BLE. Gait- deferred  ASSESSMENT/PLAN 75 year old woman with PMH significant for HTN, HLD with statin intolerances, anxiety, and  heart murmur  with sudden onset of left eye vision loss with ophthalmological exam concerning for left eye optic disc edema and possible branch retinal artery occlusion with gentle arteritis on differential. Also gave history of jaw claudication and weight loss due to difficulty chewing but no dysphagia. Sed rate of 28 and normal CRP-make GCA less likely  Giant cell arteritis exam the differentials but a artery to artery embolism should also be considered.  No acute stroke identified on MRI. CTA shows left carotid bulb stenosis 70%.   Left visual field loss with may be due to BRAO in the setting of left carotid artery stenosis   Code Stroke CT head No acute abnormality.  CTA head & neck: No LVO, Eccentric calcified plaque at the left carotid bulb with stenosis of up to 70%   MRI : No definite acute intracranial abnormality.  2D Echo: EF 10%, Grade 1 diastolic dysfunction. No thrombus, wall  motion abnormality or shunt found.   LDL 133  HgbA1c 5.2  VTE prophylaxis - Lovenox PPX on board     Diet   Diet Heart Room service appropriate? Yes; Fluid consistency: Thin   No AC/AP prior to admission  DAPT x 3 weeks followed by ASA 53m alone   Therapy recommendations:  Pending  Disposition:  TBD   Left Carotid Stenosis  Vascular surgery consult called and appreciated: Dr. FOneida Alarwill plan for left TCAR stent next Wednesday. May be discharged from his  standpoint.   Possible GCA  Normal CRP and only slightly elevated sed rate and no headache  Unilateral optic disc edema of the left eye and likely superior and inferior branch retinal artery occlusion per outpatient ophthalmology  Hypertension  Stable . Permissive hypertension (OK if < 220/120) but gradually normalize in 5-7 days . Long-term BP goal normotensive  Hyperlipidemia  Home meds:  Patient tried two statins in the past (atorvastatin and crestor) and discontinued them due to bilat knee pain but admits this pain has been present both with and without the statins. She is agreeable to new statin trial. She states she is a "bad patient" who does not follow through with recommendations at times. She recognizes that this is not in her best interest and would like to improve her compliance overall.    LDL 133, goal < 70  Add   High intensity statin: Lipitor 459m could consider changing to pravastatin if tolerance concerns   Continue statin at discharge  Other Stroke Risk Factors  Advanced Age >/= 6578 Former Cigarette smoker, advised to stop smoking  Patient advised to stop using due to stroke risk.  Other problems managed by primary team   Per primary team "patient also had some atypia of the blood vessels of the eye with concern for possible autoimmune and infectious cause. Suggest workup to rule out TB, syphilis, and autoimmune etiologies" Anxiety CKD3b Hypokalemia Chronic frequent PACs  Hospital day # 1 This plan of care was directed by Dr. XuErlinda Hong DeHetty BlendNP-C  ATTENDING NOTE: I reviewed above note and agree with the assessment and plan. Pt was seen and examined.   [age]ear old female with history of hypertension, hyperlipidemia admitted for left eye painless vision change and temporal headache with questionable jaw claudication and neck discomfort.  Ophthalmology exam showed left BRAO.  ESR 28, CRP 0.7.  CTA head and neck showed left ICA 70%  stenosis.  MRI no acute infarct.  EF 60%, LDL 133 and A1c 5.2.  Creatinine 1.0.  On exam, patient awake alert, orientated, left eye tunnel vision with peripheral visual field loss nasal help more than temporal.  No headache reported, no temporal region tenderness on palpation.  Although temporal arteritis on differential, however normal ESR/CRP and findings of left ICA 70% stenosis make temporal arteritis less likely.  Had vascular surgery consultation, recommend left TCAR next Wednesday.  She is on aspirin and Plavix and Lipitor at this time.  Patient can be discharged from neuro standpoint with outpatient follow-up.  Neurology will sign off. Please call with questions. [facility]. Thanks for the consult.  JiRosalin HawkingMD PhD Stroke Neurology 08/22/2020 8:25 PM      To contact Stroke Continuity provider, please refer to [email or web address] hours, contact General Neurology

## 2020-08-23 ENCOUNTER — Encounter (HOSPITAL_COMMUNITY): Payer: Self-pay | Admitting: Internal Medicine

## 2020-08-23 LAB — CBC
HCT: 35.2 % — ABNORMAL LOW (ref 36.0–46.0)
Hemoglobin: 11.7 g/dL — ABNORMAL LOW (ref 12.0–15.0)
MCH: 24.8 pg — ABNORMAL LOW (ref 26.0–34.0)
MCHC: 33.2 g/dL (ref 30.0–36.0)
MCV: 74.7 fL — ABNORMAL LOW (ref 80.0–100.0)
Platelets: 295 10*3/uL (ref 150–400)
RBC: 4.71 MIL/uL (ref 3.87–5.11)
RDW: 17 % — ABNORMAL HIGH (ref 11.5–15.5)
WBC: 7.8 10*3/uL (ref 4.0–10.5)
nRBC: 0 % (ref 0.0–0.2)

## 2020-08-23 LAB — BASIC METABOLIC PANEL
Anion gap: 11 (ref 5–15)
BUN: 13 mg/dL (ref 8–23)
CO2: 25 mmol/L (ref 22–32)
Calcium: 9.4 mg/dL (ref 8.9–10.3)
Chloride: 101 mmol/L (ref 98–111)
Creatinine, Ser: 1.3 mg/dL — ABNORMAL HIGH (ref 0.44–1.00)
GFR, Estimated: 43 mL/min — ABNORMAL LOW (ref >60)
Glucose, Bld: 104 mg/dL — ABNORMAL HIGH (ref 70–99)
Potassium: 3.6 mmol/L (ref 3.5–5.1)
Sodium: 137 mmol/L (ref 135–145)

## 2020-08-23 LAB — RHEUMATOID FACTOR: Rheumatoid fact SerPl-aCnc: 11.2 IU/mL (ref <14.0)

## 2020-08-23 LAB — RPR: RPR Ser Ql: NONREACTIVE

## 2020-08-23 LAB — ANA: Anti Nuclear Antibody (ANA): NEGATIVE

## 2020-08-23 LAB — ANGIOTENSIN CONVERTING ENZYME: Angiotensin-Converting Enzyme: 34 U/L (ref 14–82)

## 2020-08-23 MED ORDER — SUCRALFATE 1 GM/10ML PO SUSP
1.0000 g | Freq: Two times a day (BID) | ORAL | Status: DC | PRN
  Administered 2020-08-23: 1 g via ORAL
  Filled 2020-08-23: qty 10

## 2020-08-23 MED ORDER — ALPRAZOLAM 0.5 MG PO TABS
0.5000 mg | ORAL_TABLET | Freq: Every day | ORAL | Status: DC | PRN
  Administered 2020-08-23: 0.5 mg via ORAL
  Filled 2020-08-23: qty 1

## 2020-08-23 NOTE — Progress Notes (Signed)
In chart, patient's Code Status was DNR.  While rounding on patient this morning, she stated that her daughter was concerned about her being DNR.  Patient wants to revoke DNR status and be made a Full Code.  I encouraged her to discuss with her family and if they had any additional questions, we could page MD to bedside while they were visiting.  She also indicated that she thought her and the MD were just discussing it and she was concerned that she had not signed anything.  Dr. Wynetta Emery made aware of patient's desire to revoke DNR status.  Bracelet removed from patient's wrist.  Patient is now a Full Code.  Will continue to monitor patient.  Earleen Reaper RN

## 2020-08-23 NOTE — Progress Notes (Signed)
     Subjective: no complaints, Left eye vision still poor, "I was told by the other doctors I could go home"  Objective:  Vital signs in last 24 hours: Vitals:   08/22/20 1723 08/22/20 2120 08/23/20 0205 08/23/20 0532  BP: 140/85 (!) 157/89 (!) 164/90 (!) 177/94  Pulse: 84 (!) 58 76 74  Resp: '16 18 17 18  '$ Temp: 97.6 F (36.4 C) 98 F (36.7 C) 98.7 F (37.1 C) 98.4 F (36.9 C)  TempSrc: Oral Oral Oral   SpO2: 97% 97% 96% 94%  General: resting in bed HEENT:  EOMI, no scleral icterus Cardiac: RRR, no rubs, murmurs or gallops Pulm: clear to auscultation bilaterally Abd: soft, nontender, nondistended, BS present Ext: warm and well perfused, no pedal edema Neuro: alert and oriented X3  Assessment/Plan:   Left Retinal artery occlusion (2 branch)   Left carotid stenosis - Left renal artery occlusion suspected to be due to left carotid stenosis.   -Neurology and Vasc surgery have evaluated recommend TCAR current scheduled for Wednesday -ASA, plavix and lipator recommended by neurology -Neuro follow up at [facility] in 4 weeks  Concern for Uvitis/ Vasculitis on opthalmology evaluation - discussed with Dr Manuella Ghazi, he as concerned about some features on his exam for atypical vasculitis and posterior uveitis, recommended infectious and autoimmune workup. - I discussed this with patient, she is very hesitant to take almost any medication especially high dose steroids.  We have ordered autoimmune and well as quantiferon gold, RPR/ FTA and Lyme titers per optho request.  Recommended she stay at least overnight.  No inpatinet rheumatology consultation available, if needed would need transfer to outside hospital.  Once additional labs return will speak with Dr Manuella Ghazi again re impiric steroids/ outpatient rheum or if hospital transfer is needed.  Hypertension - amlodipine '10mg'$  daily -BP above goal may need addition of thiazide  History of statin myalgias - patient very hesistant to take statin,  atorvastin ordered by neuro -Recent TSH wnl - would check Vitamin d level (deficency known to worsen myalgias)  Dispo: Anticipated discharge in approximately 1-2 day(s).   Lucious Groves, DO 08/23/2020, 7:39 AM Pager: [phone]

## 2020-08-23 NOTE — Evaluation (Signed)
Occupational Therapy Evaluation Patient Details Name: Donna Lawson MRN: [id number] DOB: 1945/12/03 Today's Date: 08/23/2020    History of Present Illness This  75 yo female admitted after sudden loss of vision in Lt eye.  Dx:  2 branch Lt retinal artery occlusion suspected due to Lt carotid stenosis.  She is scheduled for TCAR.  Opthalmologist with concern for uveitis/vasculitis during opthalmology exam so autoimmune work up underway.  PMH includes:  OA of kner, HTN, hyperlipidemia; s/p cholecystecomy   Clinical Impression   Pt admitted with the above and demonstrates the below listed deficits.  She is able to complete ADLs independently.  Vision loss in Lt eye does not appear to impact her much functionally.  She does not drive.  Reviewed safety strategies in the community where she will experience increased distractions and she verbalizes understanding to have direct supervision when navigating curbs, and uneven surfaces to reduce risk of falls.  She lives alone and was fully independent.  She has good family support.  No further OT recommended.     Follow Up Recommendations  No OT follow up;Supervision - Intermittent (when in community)    Equipment Recommendations  None recommended by OT    Recommendations for Other Services       Precautions / Restrictions Precautions Precautions: None      Mobility Bed Mobility Overal bed mobility: Independent                  Transfers Overall transfer level: Independent                    Balance Overall balance assessment: No apparent balance deficits (not formally assessed)                                         ADL either performed or assessed with clinical judgement   ADL Overall ADL's : Independent                                       General ADL Comments: Pt reports she has been performing ADLs in her room independently without difficulty.     Vision Baseline  Vision/History: No visual deficits (s/p cataract removal) Patient Visual Report: Peripheral vision impairment (OS) Vision Assessment?: Yes Eye Alignment: Within Functional Limits Alignment/Gaze Preference: Within Defined Limits Tracking/Visual Pursuits: Able to track stimulus in all quads without difficulty Saccades: Within functional limits Visual Fields: Left visual field deficit Additional Comments: with OS, pt able to read clock on wall at distance of ~10'.  She reports decreased peripheral vision. With OU, pt able to read menu thoroughly without difficulty and able to compensate for field loss of OD.  Depth perception appears intact     Perception Perception Perception Tested?: Yes   Praxis Praxis Praxis tested?: Within functional limits    Pertinent Vitals/Pain Pain Assessment: No/denies pain     Hand Dominance Right   Extremity/Trunk Assessment Upper Extremity Assessment Upper Extremity Assessment: Overall WFL for tasks assessed   Lower Extremity Assessment Lower Extremity Assessment: Overall WFL for tasks assessed   Cervical / Trunk Assessment Cervical / Trunk Assessment: Normal   Communication Communication Communication: No difficulties   Cognition Arousal/Alertness: Awake/alert Behavior During Therapy: WFL for tasks assessed/performed Overall Cognitive Status: Within Functional Limits for tasks assessed  General Comments  Discussed potential difficulties she could encounter in community settings with increased distractions present and suggested she have direct supervision initially when navigating curbs, uneven surfaces, etc to reduce fall risk.  Pt verbalized understanding    Exercises     Shoulder Instructions      Home Living Family/patient expects to be discharged to:: Private residence Living Arrangements: Alone Available Help at Discharge: Family;Available PRN/intermittently Type of Home:  House Home Access: Level entry     Home Layout: Two level     Bathroom Shower/Tub: Occupational psychologist: Standard     Home Equipment: Cane - quad          Prior Functioning/Environment Level of Independence: Independent        Comments: Pt is independent with ADLs and IADLs in her home including financial management and medication management.  She does not drive.  Family provides transportation and she has groceries either delivered, or she goes with family to shop.        OT Problem List: Impaired vision/perception      OT Treatment/Interventions:      OT Goals(Current goals can be found in the care plan section) Acute Rehab OT Goals Patient Stated Goal: to go home for Mother's day OT Goal Formulation: All assessment and education complete, DC therapy  OT Frequency:     Barriers to D/C:            Co-evaluation              AM-PAC OT "6 Clicks" Daily Activity     Outcome Measure Help from another person eating meals?: None Help from another person taking care of personal grooming?: None Help from another person toileting, which includes using toliet, bedpan, or urinal?: None Help from another person bathing (including washing, rinsing, drying)?: None Help from another person to put on and taking off regular upper body clothing?: None Help from another person to put on and taking off regular lower body clothing?: None 6 Click Score: 24   End of Session Nurse Communication: Mobility status  Activity Tolerance: Patient tolerated treatment well Patient left: in bed;with call bell/phone within reach  OT Visit Diagnosis: Low vision, both eyes (H54.2)                Time: [id number] OT Time Calculation (min): 21 min Charges:  OT General Charges $OT Visit: 1 Visit OT Evaluation $OT Eval Low Complexity: 1 Low  Nilsa Nutting., OTR/L Acute Rehabilitation Services Pager [phone] Office [phone]   Lucille Passy M 08/23/2020, 5:11 PM

## 2020-08-23 NOTE — Evaluation (Signed)
Physical Therapy Evaluation & Discharge Patient Details Name: Donna Lawson MRN: [id number] DOB: 06/28/1945 Today's Date: 08/23/2020   History of Present Illness  This  75 yo female admitted after sudden loss of vision in Lt eye.  Dx:  2 branch Lt retinal artery occlusion suspected due to Lt carotid stenosis.  She is scheduled for TCAR.  Opthalmologist with concern for uveitis/vasculitis during opthalmology exam so autoimmune work up underway.  PMH includes:  OA of kner, HTN, hyperlipidemia; s/p cholecystecomy  Clinical Impression  Patient reports independence prior to admission. Patient currently functioning at independent level. No further skilled PT needs required acutely. No PT follow up recommended at this time. PT will sign off.     Follow Up Recommendations No PT follow up    Equipment Recommendations  None recommended by PT    Recommendations for Other Services       Precautions / Restrictions Precautions Precautions: None      Mobility  Bed Mobility Overal bed mobility: Independent                  Transfers Overall transfer level: Independent                  Ambulation/Gait Ambulation/Gait assistance: Independent Gait Distance (Feet): 20 Feet   Gait Pattern/deviations: WFL(Within Functional Limits)        Stairs            Wheelchair Mobility    Modified Rankin (Stroke Patients Only)       Balance Overall balance assessment: No apparent balance deficits (not formally assessed)                                           Pertinent Vitals/Pain Pain Assessment: No/denies pain    Home Living Family/patient expects to be discharged to:: Private residence Living Arrangements: Alone Available Help at Discharge: Family;Available PRN/intermittently Type of Home: House Home Access: Level entry     Home Layout: Two level Home Equipment: Cane - quad      Prior Function Level of Independence: Independent          Comments: Pt is independent with ADLs and IADLs in her home including financial management and medication management.  She does not drive.  Family provides transportation and she has groceries either delivered, or she goes with family to shop.     Hand Dominance   Dominant Hand: Right    Extremity/Trunk Assessment   Upper Extremity Assessment Upper Extremity Assessment: Overall WFL for tasks assessed    Lower Extremity Assessment Lower Extremity Assessment: Overall WFL for tasks assessed    Cervical / Trunk Assessment Cervical / Trunk Assessment: Normal  Communication   Communication: No difficulties  Cognition Arousal/Alertness: Awake/alert Behavior During Therapy: WFL for tasks assessed/performed Overall Cognitive Status: Within Functional Limits for tasks assessed                                        General Comments General comments (skin integrity, edema, etc.): Discussed potential difficulties she could encounter in community settings with increased distractions present and suggested she have direct supervision initially when navigating curbs, uneven surfaces, etc to reduce fall risk.  Pt verbalized understanding    Exercises     Assessment/Plan    PT Assessment  Patent does not need any further PT services  PT Problem List         PT Treatment Interventions      PT Goals (Current goals can be found in the Care Plan section)  Acute Rehab PT Goals Patient Stated Goal: to go home for Mother's day PT Goal Formulation: All assessment and education complete, DC therapy    Frequency     Barriers to discharge        Co-evaluation               AM-PAC PT "6 Clicks" Mobility  Outcome Measure Help needed turning from your back to your side while in a flat bed without using bedrails?: None Help needed moving from lying on your back to sitting on the side of a flat bed without using bedrails?: None Help needed moving to and from a  bed to a chair (including a wheelchair)?: None Help needed standing up from a chair using your arms (e.g., wheelchair or bedside chair)?: None Help needed to walk in hospital room?: None Help needed climbing 3-5 steps with a railing? : None 6 Click Score: 24    End of Session   Activity Tolerance: Patient tolerated treatment well Patient left: in bed;with call bell/phone within reach Nurse Communication: Mobility status PT Visit Diagnosis: Muscle weakness (generalized) (M62.81)    Time: [id number] PT Time Calculation (min) (ACUTE ONLY): 10 min   Charges:   PT Evaluation $PT Eval Low Complexity: 1 Low          Palmira Stickle A. Gilford Rile PT, DPT Acute Rehabilitation Services Pager [phone] Office [phone]   Linna Hoff 08/23/2020, 5:34 PM

## 2020-08-23 NOTE — Evaluation (Signed)
SLP Cancellation Note  Patient Details Name: Donna Lawson MRN: [id number] DOB: [birth year]   Cancelled treatment:       Reason Eval/Treat Not Completed: Other (comment) (spoke to RN Manuela Schwartz who advised pt not having any dysphagia and passed Yale swallow screen= pls reorder if indicated)   Macario Golds 08/23/2020, 10:00 AM   Kathleen Lime, MS [place] Office [phone] Pager [phone]

## 2020-08-24 ENCOUNTER — Other Ambulatory Visit: Payer: Self-pay

## 2020-08-24 DIAGNOSIS — N280 Ischemia and infarction of kidney: Secondary | ICD-10-CM | POA: Diagnosis not present

## 2020-08-24 DIAGNOSIS — I6522 Occlusion and stenosis of left carotid artery: Secondary | ICD-10-CM | POA: Diagnosis not present

## 2020-08-24 DIAGNOSIS — I129 Hypertensive chronic kidney disease with stage 1 through stage 4 chronic kidney disease, or unspecified chronic kidney disease: Secondary | ICD-10-CM | POA: Diagnosis not present

## 2020-08-24 DIAGNOSIS — Z66 Do not resuscitate: Secondary | ICD-10-CM | POA: Diagnosis not present

## 2020-08-24 DIAGNOSIS — Z888 Allergy status to other drugs, medicaments and biological substances status: Secondary | ICD-10-CM | POA: Diagnosis not present

## 2020-08-24 DIAGNOSIS — E559 Vitamin D deficiency, unspecified: Secondary | ICD-10-CM | POA: Diagnosis present

## 2020-08-24 DIAGNOSIS — E785 Hyperlipidemia, unspecified: Secondary | ICD-10-CM | POA: Diagnosis not present

## 2020-08-24 DIAGNOSIS — H34232 Retinal artery branch occlusion, left eye: Secondary | ICD-10-CM | POA: Diagnosis not present

## 2020-08-24 DIAGNOSIS — E876 Hypokalemia: Secondary | ICD-10-CM | POA: Diagnosis not present

## 2020-08-24 DIAGNOSIS — Z20822 Contact with and (suspected) exposure to covid-19: Secondary | ICD-10-CM | POA: Diagnosis not present

## 2020-08-24 DIAGNOSIS — N1832 Chronic kidney disease, stage 3b: Secondary | ICD-10-CM | POA: Diagnosis not present

## 2020-08-24 LAB — COMPREHENSIVE METABOLIC PANEL
ALT: 8 U/L (ref 0–44)
AST: 12 U/L — ABNORMAL LOW (ref 15–41)
Albumin: 3.4 g/dL — ABNORMAL LOW (ref 3.5–5.0)
Alkaline Phosphatase: 77 U/L (ref 38–126)
Anion gap: 9 (ref 5–15)
BUN: 11 mg/dL (ref 8–23)
CO2: 26 mmol/L (ref 22–32)
Calcium: 9.1 mg/dL (ref 8.9–10.3)
Chloride: 102 mmol/L (ref 98–111)
Creatinine, Ser: 1.11 mg/dL — ABNORMAL HIGH (ref 0.44–1.00)
GFR, Estimated: 52 mL/min — ABNORMAL LOW (ref >60)
Glucose, Bld: 98 mg/dL (ref 70–99)
Potassium: 3 mmol/L — ABNORMAL LOW (ref 3.5–5.1)
Sodium: 137 mmol/L (ref 135–145)
Total Bilirubin: 0.6 mg/dL (ref 0.3–1.2)
Total Protein: 6.3 g/dL — ABNORMAL LOW (ref 6.5–8.1)

## 2020-08-24 LAB — PHOSPHORUS: Phosphorus: 2.7 mg/dL (ref 2.5–4.6)

## 2020-08-24 LAB — VITAMIN D 25 HYDROXY (VIT D DEFICIENCY, FRACTURES): Vit D, 25-Hydroxy: 10.38 ng/mL — ABNORMAL LOW (ref 30–100)

## 2020-08-24 LAB — TROPONIN I (HIGH SENSITIVITY)
Troponin I (High Sensitivity): 7 ng/L (ref <18)
Troponin I (High Sensitivity): 6 ng/L (ref <18)

## 2020-08-24 LAB — MAGNESIUM: Magnesium: 1.8 mg/dL (ref 1.7–2.4)

## 2020-08-24 MED ORDER — AMLODIPINE BESYLATE 10 MG PO TABS: 10.0000 mg | ORAL_TABLET | Freq: Every day | ORAL | 0 refills | Status: DC

## 2020-08-24 MED ORDER — ASPIRIN 81 MG PO TBEC: 81.0000 mg | DELAYED_RELEASE_TABLET | Freq: Every day | ORAL | 0 refills | Status: DC

## 2020-08-24 MED ORDER — POTASSIUM CHLORIDE 20 MEQ PO PACK
20.0000 meq | PACK | Freq: Every day | ORAL | 0 refills | Status: DC
Start: 2020-08-24 — End: 2020-09-24

## 2020-08-24 MED ORDER — BISMUTH SUBSALICYLATE 262 MG/15ML PO SUSP
30.0000 mL | ORAL | Status: DC | PRN
  Administered 2020-08-24: 30 mL via ORAL
  Filled 2020-08-24: qty 236

## 2020-08-24 MED ORDER — MAGNESIUM SULFATE 2 GM/50ML IV SOLN
2.0000 g | Freq: Once | INTRAVENOUS | Status: AC
  Administered 2020-08-24: 2 g via INTRAVENOUS
  Filled 2020-08-24: qty 50

## 2020-08-24 MED ORDER — VITAMIN D (ERGOCALCIFEROL) 1.25 MG (50000 UNIT) PO CAPS
50000.0000 [IU] | ORAL_CAPSULE | ORAL | Status: DC
  Administered 2020-08-24: 50000 [IU] via ORAL
  Filled 2020-08-24: qty 1

## 2020-08-24 MED ORDER — VITAMIN D (ERGOCALCIFEROL) 1.25 MG (50000 UNIT) PO CAPS: 50000.0000 [IU] | ORAL_CAPSULE | ORAL | 0 refills | Status: DC

## 2020-08-24 MED ORDER — POTASSIUM CHLORIDE 20 MEQ PO PACK
40.0000 meq | PACK | Freq: Once | ORAL | Status: AC
  Administered 2020-08-24: 40 meq via ORAL
  Filled 2020-08-24: qty 2

## 2020-08-24 MED ORDER — ATORVASTATIN CALCIUM 40 MG PO TABS: 40.0000 mg | ORAL_TABLET | Freq: Every day | ORAL | 0 refills | Status: DC

## 2020-08-24 MED ORDER — CARVEDILOL 3.125 MG PO TABS: 3.1250 mg | ORAL_TABLET | Freq: Two times a day (BID) | ORAL | 0 refills | Status: DC

## 2020-08-24 MED ORDER — CLOPIDOGREL BISULFATE 75 MG PO TABS: 75.0000 mg | ORAL_TABLET | Freq: Every day | ORAL | 0 refills | Status: DC

## 2020-08-24 NOTE — Progress Notes (Signed)
Interim progress note  OBJECTIVE: Blood pressure (!) 180/98, pulse 84, temperature 99.6 F (37.6 C), temperature source Oral, resp. rate 18, SpO2 97 %.  Physical exam: General: Well-appearing, nontoxic-appearing Respiratory: Comfortable work of breathing on room air Cardio: Irregularly irregular rhythm, heart rate 91  Was contacted by patient's RN Joelene Millin at 2:10 AM regarding patient having a few current issues:  1.  Indigestion: Patient reports that she had meatloaf for dinner.  Was given sucralfate x1 for indigestion at the time after dinner.  Patient took the sucralfate and her indigestion went away.  During recent vitals check the patient stated that her indigestion had returned.  Of note, patient had cholecystectomy June 30, 2020.  At the time she denies shortness of breath, chest pain, nausea, vomiting, abdominal pain. -We will reorder Pepto-Bismol as this is what has worked for her previously at home -Also believe her lack of gallbladder is contributing to her indigestion, patient may benefit from more education of what she should/shouldn't eat after cholecystectomy.   2.  Arrhythmia on telemetry: Patient currently on telemetry.  Patient has history of irregular rhythm, was previously seen at [facility] clinic with concerns for paroxysmal A. fib.  EKG at the time showed normal sinus rhythm.  CHA2DS2-VASc score is 4, patient has not been started on anticoagulation as she has not been proven to have A. fib as of now.  Additionally, patient recently had bradycardia down to 38 appreciated on telemetry.  Because of her bradycardia her carvedilol 3.125 twice daily was discontinued on 08/22/2020.  Patient remains on amlodipine 10 mg daily.  Patient was appreciated to have arrhythmia on telemetry for which [facility] was contacted.  Telemetry strip reviewed by myself and Dr. Tarry Kos, showed a regular rhythm, sometimes concerns for second-degree block with mixed type I and type II presentation, occasional  episodes of bradycardia down to 40 bpm, but then switches to rate controlled atrial fibrillation, then back to sinus rhythm.  Patient's vital signs are stable (although BP slightly elevated, see below), she is breathing comfortably on room air and is in no apparent distress.  She is denying any acute chest pain at this time. -EKG ordered showed normal sinus rhythm, shortly after repeat EKG showed rate controlled A. fib -Continue telemetry -Ordering troponins to rule out usual cardiac event as cause of patient's indigestion -Check CMP, mag, Phos (phlebotomy contacted) -We will call cardiology if patient were to become unstable -Patient supposed to follow-up with cardiology for outpatient event monitoring.  Recommend patient continues to follow-up with cardiology for Holter monitor.  3.  Elevated BP: Blood pressure noted to be elevated at 180/98.  At this time she is denying headaches, chest pain, shortness of breath.  Blood pressure is elevated in the setting of her carvedilol 125 twice daily being discontinued since 08/22/2020.  Patient's anxiety and acute epigastric discomfort could be contributing to her acutely elevated BP, patient reports lack of sleep while being in the hospital which has been frustrating to her. -No intervention at this time as patient is otherwise asymptomatic, stable. -We will await troponins -Recommended nurse allows patient to sleep, however patient's vitals are currently ordered for 10 AM, 6 PM, and 2 AM.    Donna Lawson, [place], PGY-3 08/24/2020 3:32 AM

## 2020-08-24 NOTE — Progress Notes (Signed)
Internal Medicine Attending:   I saw and examined the patient. I reviewed Dr Jackson Latino note and I agree with the resident's findings and plan as documented in the resident's note.  As noted her autoimmune/infectious work-up has been slowly returning at this point low RA titer negative RPR, Luis level and negative ANA does help to provide some reassurance.  ANCA is not back yet but with low sed rate I would suspect this would be a bit less likely.  TB less likely given lack of exposure and normal chest x-ray.  Patient is fairly adamant that she does not do well with steroids and would not take high-dose steroids unless a very compelling reason.  This would also interfere with wound healing from her planned upcoming TCAR.  We will given her negative work-up so far we discussed plan for discharge today I will certainly call her if any of the autoimmune antibodies come back positive and we will develop a plan.  We will have her follow-up closely in our clinic as well as with ophthalmology she does have plans to see a retinal specialist later this month.  Additionally with her statin intolerance I did check a vitamin D level this came back deficient and have started her on 50,000 units of vitamin D2 daily she will need to continue this for at least 8 weeks and eventually transition to a low-dose over-the-counter vitamin D3 supplement.  I discussed this may help with the tolerability of her statin medication which she needs for her carotid stenosis and hyperlipidemia.  Additionally EKG last night appeared to be irregular computer read was A. fib on my personal review of it I do recognize P waves with some extra PACs.  I believe this is very similar to what got her sent to the A. fib clinic in March.  She has a plan for follow-up and heart rate monitor which I told her I think would be a good idea to follow up with the clinic.  Otherwise she is stable for discharge today.  Lucious Groves, DO

## 2020-08-24 NOTE — Progress Notes (Signed)
     Subjective:  ON Events: Indigestion, Arrhythmia on Tele.  Seen at bedside. Patient states that she is doing okay. Informed the night team that she just had indigestion, and was worried after she received additional testing. She is anxious about her procedure on Wednesday, but understands that it is a needed intervention, but wonders if this is GCA. No changes in her vision. Objective:  Vital signs in last 24 hours: Vitals:   08/23/20 1111 08/23/20 1711 08/23/20 2032 08/24/20 0207  BP: (!) 159/89 (!) 170/83 (!) 167/92 (!) 180/98  Pulse: 85 (!) 53 95 84  Resp: '18 14 17 18  '$ Temp: 98.1 F (36.7 C) 97.6 F (36.4 C) 98.6 F (37 C) 99.6 F (37.6 C)  TempSrc: Oral Oral Oral Oral  SpO2: 95% 95% 98% 97%  Physical Exam Constitutional:      General: She is not in acute distress.    Appearance: Normal appearance. She is not ill-appearing or toxic-appearing.  HENT:     Head: Normocephalic and atraumatic.  Cardiovascular:     Rate and Rhythm: Normal rate and regular rhythm.     Pulses: Normal pulses.     Heart sounds: Normal heart sounds. No murmur heard. No friction rub. No gallop.   Pulmonary:     Effort: Pulmonary effort is normal.     Breath sounds: Normal breath sounds.  Neurological:     Mental Status: She is alert and oriented to person, place, and time.     Assessment/Plan:   Left Retinal artery occlusion (2 branch)   Left carotid stenosis Likely in setting of L carotid stenosis.  - TCAR on 08/27/20 - ASA, Plavix and Lipitor recommended by neurology - FU with Neuro in 4 weeks.   Concern for Uvitis/ Vasculitis on opthalmology evaluation:  - Outpatient ophtho Dr Manuella Ghazi, he as concerned about some features on his exam for atypical vasculitis and posterior uveitis, awaiting results from the rest of the infectious and autoimmune workup. - RA, RPR, ACE, ANA negative  Hypertension:  Pressures continue to be elevated with systolics in the [id number], currently asymptomatic.  -  Amlodipine '10mg'$  daily -Consider thiazide in the outpatient setting.   History of statin myalgias:  - patient very hesistant to take statin, atorvastin ordered by neuro, appears to be doing well. - Vitamin D level pending  CKD 3b:  Continue to monitor kidney function  Hypokalemia, Chronic:  - Continue to follow BMP and replete as needed.   HLD, Chronic:  Started on statin by Neuro. - Continue Lipitor 40 mg daily  Dispo: Anticipated discharge in approximately 1-2 day(s).   Maudie Mercury, MD 08/24/2020, 6:11 AM Pager: [phone]

## 2020-08-24 NOTE — Discharge Summary (Signed)
Name: Donna Lawson MRN: [id number] DOB: [birth year] 75 y.o. PCP: Gaylan Gerold, DO  Date of Admission: 08/21/2020  4:43 PM Date of Discharge: 08/24/2020 Attending Physician: Joni Reining DO Discharge Diagnosis: 1. Left Retinal Artery Occlusion/Left Carotid Stenosis 2. Concern for Uvitis/Vasculitis on Ophthalmologic Evaluation 3. HTN 4.  Chronic Hypokalemia 5. Hyperlipidemia  Discharge Medications: Allergies as of 08/24/2020      Reactions   Atorvastatin Other (See Comments)   Other reaction(s): myalgias   Crestor [rosuvastatin Calcium] Other (See Comments)   Other reaction(s): myalgias   Lisinopril Swelling   Tongue swelling   Cefuroxime Axetil Other (See Comments)   Other reaction(s): not feel well   Oxycodone-acetaminophen Other (See Comments)   Other reaction(s): feel weird      Medication List    STOP taking these medications   oxyCODONE 5 MG immediate release tablet Commonly known as: Oxy IR/ROXICODONE     TAKE these medications   acetaminophen 500 MG tablet Commonly known as: TYLENOL Take 500 mg by mouth every 6 (six) hours as needed for moderate pain or headache.   albuterol 108 (90 Base) MCG/ACT inhaler Commonly known as: VENTOLIN HFA Inhale 2 puffs into the lungs every 6 (six) hours as needed for wheezing or shortness of breath.   ALPRAZolam 0.5 MG tablet Commonly known as: Xanax Take 1 tablet (0.5 mg total) at bedtime as needed by mouth for anxiety or sleep.   amLODipine 10 MG tablet Commonly known as: NORVASC Take 1 tablet (10 mg total) by mouth daily.   aspirin 81 MG EC tablet Take 1 tablet (81 mg total) by mouth daily. Swallow whole.   atorvastatin 40 MG tablet Commonly known as: LIPITOR Take 1 tablet (40 mg total) by mouth daily.   carvedilol 3.125 MG tablet Commonly known as: COREG Take 1 tablet (3.125 mg total) by mouth 2 (two) times daily with a meal.   clopidogrel 75 MG tablet Commonly known as: PLAVIX Take 1 tablet (75 mg total) by  mouth daily.   polyethylene glycol 17 g packet Commonly known as: MIRALAX / GLYCOLAX Take 17 g by mouth daily as needed for mild constipation.   potassium chloride 20 MEQ packet Commonly known as: KLOR-CON Take 20 mEq by mouth daily.   Vitamin D (Ergocalciferol) 1.25 MG (50000 UNIT) Caps capsule Commonly known as: DRISDOL Take 1 capsule (50,000 Units total) by mouth every 7 (seven) days. Start taking on: Aug 31, 2020       Disposition and follow-up:   Donna Lawson was discharged from [facility] in Stable condition.  At the hospital follow up visit please address:  1. Left Retinal Artery Occlusion/Left Carotid Stenosis  - Continue aspirin 81 mg daily  - Continue Plavix 75 mg daily   - Continue Lipitor 40 mg daily   - Present for surgical intervention on 5/11  2. Concern for Uvitis/Vasculitis on Ophthalmologic Evaluation  - Follow up B. Burgodori, TB, RPR, ANCA titers, Aldo + renin activity w/ ratio, HLA-B27 Ag  3. HTN  - Continue Norvasc 10 mg  - Monitor Vitals  - Consider Thiazide if pressures remain elevated.   4.  Chronic Hypokalemia  - Continue potassium chloride 20 MEQ packet daily   5. Hyperlipidemia  - Continue Lipitor 40 mg daily  6. Hx of Myalgias with Statin/ Vitamin D deficiency   - Continue Lipitor 40 mg daily  - Found to have low Vit D which is associated with myalgias.    - Vitamin D 50,000  capsule one time per week.     2.  Labs / imaging needed at time of follow-up: None  3.  Pending labs/ test needing follow-up: Follow up B. Burgodori, TB, RPR, ANCA titers, Aldo + renin activity w/ ratio, HLA-B27 Ag   Follow-up Appointments:  Follow-up Information    [facility]. Schedule an appointment as soon as possible for a visit in 4 week(s).   Specialty: Neurology Contact information: [address] [place] [phone]       Gaylan Gerold, DO. Schedule an appointment as  soon as possible for a visit in 1 week(s).   Specialty: Internal Medicine Contact information: [place] [address] [address] [place] Hospital Course by problem list: 1. Left Retinal Artery Occlusion/Left Carotid Stenosis: Patient presented from ophthalmology appointment with concerns for retinal artery occlusion in multiple branches of her L orbit after L vision loss. She underwent CT angiogram and MRI of the brain showing 70% stenosis of the L carotid artery. While inpatient an extensive immune/infectious workup was started with some concerns for Giant Cell Arteritis, which has since come back negative, some labs still pending. Discussed concerns with neurology and vascular surgery who both feel her symptoms came from her stenosis, and plan to perform a Left transcarotid artery revascularization on 08/27/20. Patient discharged in stable condition on plavix, ASA, and statin. Will undergo her procedure on 08/27/20.  2. Concern for Uvitis/Vasculitis on Ophthalmologic Evaluation Patient presented emergently from her ophthalmology appointment with concerns for Uvitis/Vasculitis. An immunological and infectious work up was started. She had a mildly elevated sedimentation rate (28) and a normal CRP. But given her Left sided vision loss, and 70% stenosis of her Left carotid it was felt that her stenosis was likely the major contributing factor of her vision loss. While she does have some labs pending, the majority of her immunological work up and infectious workup came back negative. She was discharged in stable condition and will undergo a transcartoid artery revascularization procedure on 08/28/20.  3. HTN Patient presented to the ED with a history of hypertension, on Norvasc. Her vital signs were monitored, and Norvasc continued. During her stay she had several elevated pressures with systolics of [phone]. She was discharged in stable condition.   4.  Chronic  Hypokalemia Patient with History of chronic hypokalemia. During her stay her potassium stores were repleted and she was discharged in stable condition with potassium chloride 20 MEQ packet daily.  5. Hyperlipidemia Patient with History of hyperlipidemia was started on Lipitor 40 mg daily. Does have Hx of myalgias to Crestor, but tolerated Lipitor during her hospital stay.   Pertinent Labs, Studies, and Procedures:  CLINICAL DATA:  Initial evaluation for neuro deficit, stroke suspected.  EXAM: MRI HEAD WITHOUT CONTRAST  TECHNIQUE: Multiplanar, multiecho pulse sequences of the brain and surrounding structures were obtained without intravenous contrast.  COMPARISON:  Prior CTs from 08/21/2020.  FINDINGS: Brain: Examination mildly degraded by motion artifact.  Cerebral volume within normal limits for age. Patchy T2/FLAIR hyperintensity within the periventricular and deep white matter both cerebral hemispheres most consistent with chronic small vessel ischemic disease, moderate in nature. Few scattered superimposed remote lacunar infarcts noted about the right basal ganglia and thalami.  No abnormal foci of restricted diffusion to suggest acute or subacute ischemia. Apparent vague diffusion signal involving the ventral pons on axial image 66 noted, not seen on corresponding  sequences, and favored to be artifactual nature. Gray-white matter differentiation maintained. No encephalomalacia to suggest chronic cortical infarction. No acute intracranial hemorrhage. 9 mm focus of chronic hemosiderin staining noted at the left occipital lobe (series 15, image 20), nonspecific, but of doubtful significance in the acute setting.  No mass lesion, midline shift or mass effect. No hydrocephalus or extra-axial fluid collection. Pituitary gland suprasellar region within normal limits. Midline structures intact.  Vascular: Major intracranial vascular flow voids are maintained.  Skull  and upper cervical spine: Craniocervical junction within normal limits. Bone marrow signal intensity normal. No scalp soft tissue abnormality.  Sinuses/Orbits: Patient status post bilateral ocular lens replacement. Globes and orbital soft tissues demonstrate no acute finding. Left sphenoid sinus disease noted. Paranasal sinuses are otherwise clear. Trace right mastoid effusion noted.  Other: None.  IMPRESSION: 1. No definite acute intracranial abnormality. Vague diffusion abnormality involving the left ventral pons on axial DWI image 66 favored to be artifactual in nature. If there is high clinical suspicion for a possible ischemic infarct at this location, a repeat study with thin section imaging through the brainstem would be suggested for further evaluation. 2. Underlying age-related cerebral atrophy with moderate chronic microvascular ischemic disease. 3. Left sphenoid sinus disease.  CLINICAL DATA:  Initial evaluation for neuro deficit, stroke suspected.  EXAM: CT ANGIOGRAPHY HEAD AND NECK  TECHNIQUE: Multidetector CT imaging of the head and neck was performed using the standard protocol during bolus administration of intravenous contrast. Multiplanar CT image reconstructions and MIPs were obtained to evaluate the vascular anatomy. Carotid stenosis measurements (when applicable) are obtained utilizing NASCET criteria, using the distal internal carotid diameter as the denominator.  CONTRAST:  108mL OMNIPAQUE IOHEXOL 350 MG/ML SOLN  COMPARISON:  Prior head CT from earlier the same day.  FINDINGS: CTA NECK FINDINGS  Aortic arch: Visualized aortic arch ectatic measuring up to 3.8 cm in diameter. Origin of the great vessels incompletely assessed on this exam. No hemodynamically significant stenosis seen about the origin the great vessels.  Right carotid system: Right CCA tortuous but is widely patent to the bifurcation without stenosis. Concentric mixed  plaque about the right bifurcation without significant stenosis. Right ICA tortuous but widely patent distally without stenosis, dissection or occlusion.  Left carotid system: Left CCA tortuous but is widely patent to the bifurcation. Eccentric calcified plaque at the left carotid bulb with associated stenosis of up to 70% by NASCET criteria. Left ICA tortuous but is widely patent distally without stenosis, dissection or occlusion.  Vertebral arteries: Both vertebral arteries arise from the subclavian arteries. No hemodynamically significant proximal subclavian artery stenosis. Vertebral arteries mildly tortuous but are widely patent without stenosis, dissection or occlusion.  Skeleton: No visible acute osseous finding. No worrisome osseous lesions.  Other neck: No other acute soft tissue abnormality within the neck. Approximate 4.6 cm right thyroid nodule. Per history, this has been previously evaluated and biopsied (ref: J Am Coll Radiol. 2015 Feb;12(2): 143-50). No other mass or adenopathy.  Upper chest: Visualized upper chest demonstrates no acute finding.  Review of the MIP images confirms the above findings  CTA HEAD FINDINGS  Anterior circulation: Petrous segments patent bilaterally. Mild atheromatous change within the carotid siphons without hemodynamically significant stenosis. A1 segments widely patent. Normal anterior communicating artery complex. Anterior cerebral arteries patent to their distal aspects without stenosis. No M1 stenosis or occlusion. Normal MCA bifurcations. Distal MCA branches well perfused bilaterally.  Posterior circulation: Both V4 segments patent to the vertebrobasilar junction without stenosis. Both  PICA origins patent and normal. Basilar widely patent to its distal aspect without stenosis. Superior cerebellar arteries patent bilaterally. Both PCAs primarily supplied via the basilar well perfused to their distal aspects. Small  right posterior communicating artery noted.  Venous sinuses: Not well assessed due to timing the contrast bolus.  Anatomic variants: None significant.  No aneurysm.  Review of the MIP images confirms the above findings  IMPRESSION: 1. Negative CTA for large vessel occlusion. 2. Atheromatous plaque at the left carotid bulb with associated short-segment stenosis of up to 70% by NASCET criteria. 3. Mild for age atheromatous change elsewhere about the major arterial vasculature of the head and neck. No other hemodynamically significant or correctable stenosis. 4. Diffuse tortuosity of the major arterial vasculature of the head and neck, suggesting chronic underlying hypertension. 5. Ectatic aortic arch measuring up to 3.8 cm. Recommend annual imaging followup by CTA or MRA. This recommendation follows 2010 ACCF/AHA/AATS/ACR/ASA/SCA/SCAI/SIR/STS/SVM Guidelines for the Diagnosis and Management of Patients with Thoracic Aortic Disease. Circulation.2010; 121: I967-E938. Aortic aneurysm NOS (ICD10-I71.9) 6. 4.6 cm right thyroid nodule. Per history, this has been previously evaluated and biopsied. Please refer to prior imaging/history for any potential recommendations regarding this finding. (ref: J Am Coll Radiol. 2015 Feb;12(2): 143-50).  CLINICAL DATA:  Headache  EXAM: CT HEAD WITHOUT CONTRAST  TECHNIQUE: Contiguous axial images were obtained from the base of the skull through the vertex without intravenous contrast.  COMPARISON:  None.  FINDINGS: Brain: No acute territorial infarction, hemorrhage or intracranial mass. Mild atrophy. Patchy white matter hypodensity consistent with chronic small vessel ischemic change. Nonenlarged ventricles.  Vascular: No hyperdense vessels.  Carotid vascular calcification  Skull: Normal. Negative for fracture or focal lesion.  Sinuses/Orbits: Mucosal thickening in the sphenoid sinuses  Other: None  IMPRESSION: 1. No CT  evidence for acute intracranial abnormality. Atrophy and mild chronic small vessel ischemic change of the white matter 2. Sinus disease    Ref Range & Units 5 d ago  Sed Rate 0 - 22 mm/hr 28High    Ref Range & Units 5 d ago  CRP <1.0 mg/dL 0.7     Component Ref Range & Units 4 d ago 3 wk ago 4 yr ago 5 yr ago 7 yr ago 8 yr ago  Cholesterol 0 - 200 mg/dL 213High   292High CM  240High CM  206High CM  259High CM   Triglycerides <150 mg/dL 100  175High R  214.0High R, CM  183.0High R, CM  118.0 R, CM  250.0High R, CM   HDL >40 mg/dL 60  63 R  64.40 R  64.10 R  67.90 R  58.80 R   Total CHOL/HDL Ratio RATIO 3.6  4.1 R, CM  5 R, CM  4 R, CM  3 R, CM  4 R, CM   VLDL 0 - 40 mg/dL 20   42.8High R  36.6 R  23.6 R  50.0High R   LDL Cholesterol 0 - 99 mg/dL 133High             Discharge Instructions: Discharge Instructions    Ambulatory referral to Neurology   Complete by: As directed    Follow up with stroke clinic NP (Jessica Vanschaick or Cecille Rubin, if both not available, consider Zachery Dauer, or Ahern) at [facility] in about 4 weeks. Thanks.   Diet - low sodium heart healthy   Complete by: As directed    Increase activity slowly   Complete by: As directed  Ref Range & Units 4 d ago  Anti Nuclear Antibody (ANA) Negative Negative      Ref Range & Units 4 d ago  Angiotensin-Converting Enzyme 14 - 82 U/L 34     Ref Range & Units 4 d ago  Lysozyme 2.5 - 12.9 ug/mL 8.2     Ref Range & Units 4 d ago  RPR Ser Ql NON REACTIVE NON REACTIVE     Ref Range & Units 4 d ago  Rhuematoid fact SerPl-aCnc <14.0 IU/mL 11.2     Ref Range & Units 4 d ago  Fluorescent Treponemal Ab, IgG Non Reactive Non Reactive     Ref Range & Units 2 d ago  Vit D, 25-Hydroxy 30 - 100 ng/mL 10.38Low      Signed: Maudie Mercury, MD 08/26/2020, 2:38 PM   Pager: [phone]

## 2020-08-24 NOTE — Discharge Instructions (Signed)
To Ms. Repinski,  It was a pleasure taking care of you during your stay at [facility]. During your stay you underwent a thorough work up for your left sided vision loss. While you were evaluated in the emergency department, imaging showed you had a stenosis of your left carotid that likely caused your vision loss. Please follow up with your procedure on 08/27/2020. We have additionally ordered labs to rule out signs of infection or autoimmune disorders. Your initial labs are negative, but we will have you follow in our clinic to follow up your results. You have additionally been prescribed some new medications (aspirine, Lipitor, and Plavix). Please continue to take your medications as indicated on the label. Please come back for evaluation if you notice worsening symptoms of vision loss, weakness, or slurring of your speech, or changes in your thought process.

## 2020-08-25 ENCOUNTER — Other Ambulatory Visit: Payer: Self-pay

## 2020-08-25 LAB — LYSOZYME, SERUM: Lysozyme: 8.2 ug/mL (ref 2.5–12.9)

## 2020-08-25 LAB — FLUORESCENT TREPONEMAL AB(FTA)-IGG-BLD: Fluorescent Treponemal Ab, IgG: NONREACTIVE

## 2020-08-25 NOTE — Progress Notes (Signed)
Surgical Instructions    Your procedure is scheduled on 08/27/20.  Report to [facility] Main Entrance "A" at 06:30 A.M., then check in with the Admitting office.  Call this number if you have problems the morning of surgery:  [phone]   If you have any questions prior to your surgery date call [phone]: Open Monday-Friday 8am-4pm    Remember:  Do not eat or drink after midnight the night before your surgery     Take these medicines the morning of surgery with A SIP OF WATER  acetaminophen (TYLENOL) if needed for moderate pain albuterol (VENTOLIN HFA) 108 (90 Base) as needed for wheezing or shortness of breath ALPRAZolam (XANAX) as needed for anxiety amLODipine (NORVASC)  atorvastatin (LIPITOR) carvedilol (COREG)  Aspirin clopidogrel (PLAVIX)    As of today, STOP taking any (unless otherwise instructed by your surgeon) Aleve, Naproxen, Ibuprofen, Motrin, Advil, Goody's, BC's, all herbal medications, fish oil, and all vitamins.  Please continue taking your aspirin and Plavix through the day of surgery.                      Do not wear jewelry, make up, or nail polish            Do not wear lotions, powders, perfumes, or deodorant.            Do not shave 48 hours prior to surgery.              Do not bring valuables to the hospital.            [facility] is not responsible for any belongings or valuables.  Do NOT Smoke (Tobacco/Vaping) or drink Alcohol 24 hours prior to your procedure If you use a CPAP at night, you may bring all equipment for your overnight stay.   Contacts, glasses, dentures or bridgework may not be worn into surgery, please bring cases for these belongings   For patients admitted to the hospital, discharge time will be determined by your treatment team.   Patients discharged the day of surgery will not be allowed to drive home, and someone needs to stay with them for 24 hours.    Special instructions:   [place]- Preparing For  Surgery  Before surgery, you can play an important role. Because skin is not sterile, your skin needs to be as free of germs as possible. You can reduce the number of germs on your skin by washing with CHG (chlorahexidine gluconate) Soap before surgery.  CHG is an antiseptic cleaner which kills germs and bonds with the skin to continue killing germs even after washing.    Oral Hygiene is also important to reduce your risk of infection.  Remember - BRUSH YOUR TEETH THE MORNING OF SURGERY WITH YOUR REGULAR TOOTHPASTE  Please do not use if you have an allergy to CHG or antibacterial soaps. If your skin becomes reddened/irritated stop using the CHG.  Do not shave (including legs and underarms) for at least 48 hours prior to first CHG shower. It is OK to shave your face.  Please follow these instructions carefully.   1. Shower the NIGHT BEFORE SURGERY and the MORNING OF SURGERY  2. If you chose to wash your hair, wash your hair first as usual with your normal shampoo.  3. After you shampoo, rinse your hair and body thoroughly to remove the shampoo.  4. Wash Face and genitals (private parts) with your normal soap.   5.  Shower  the NIGHT BEFORE SURGERY and the MORNING OF SURGERY with CHG Soap.   6. Use CHG Soap as you would any other liquid soap. You can apply CHG directly to the skin and wash gently with a scrungie or a clean washcloth.   7. Apply the CHG Soap to your body ONLY FROM THE NECK DOWN.  Do not use on open wounds or open sores. Avoid contact with your eyes, ears, mouth and genitals (private parts). Wash Face and genitals (private parts)  with your normal soap.   8. Wash thoroughly, paying special attention to the area where your surgery will be performed.  9. Thoroughly rinse your body with warm water from the neck down.  10. DO NOT shower/wash with your normal soap after using and rinsing off the CHG Soap.  11. Pat yourself dry with a CLEAN TOWEL.  12. Wear CLEAN PAJAMAS to bed  the night before surgery  13. Place CLEAN SHEETS on your bed the night before your surgery  14. DO NOT SLEEP WITH PETS.   Day of Surgery: Take a shower with CHG soap. Wear Clean/Comfortable clothing the morning of surgery Do not apply any deodorants/lotions.   Remember to brush your teeth WITH YOUR REGULAR TOOTHPASTE.   Please read over the following fact sheets that you were given.

## 2020-08-26 ENCOUNTER — Other Ambulatory Visit: Payer: Self-pay

## 2020-08-26 ENCOUNTER — Telehealth: Payer: Self-pay

## 2020-08-26 ENCOUNTER — Encounter (HOSPITAL_COMMUNITY): Payer: Self-pay

## 2020-08-26 ENCOUNTER — Ambulatory Visit (HOSPITAL_COMMUNITY): Payer: Medicare HMO | Admitting: Physician Assistant

## 2020-08-26 ENCOUNTER — Encounter (HOSPITAL_COMMUNITY)
Admission: RE | Admit: 2020-08-26 | Discharge: 2020-08-26 | Disposition: A | Discharge status: Home / Self Care | Payer: Medicare HMO | Source: Ambulatory Visit | Attending: Vascular Surgery | Admitting: Vascular Surgery

## 2020-08-26 ENCOUNTER — Ambulatory Visit (HOSPITAL_COMMUNITY): Payer: Medicare HMO

## 2020-08-26 ENCOUNTER — Other Ambulatory Visit (HOSPITAL_COMMUNITY): Payer: Medicare HMO

## 2020-08-26 DIAGNOSIS — Z01812 Encounter for preprocedural laboratory examination: Secondary | ICD-10-CM | POA: Insufficient documentation

## 2020-08-26 DIAGNOSIS — Z20822 Contact with and (suspected) exposure to covid-19: Secondary | ICD-10-CM | POA: Insufficient documentation

## 2020-08-26 LAB — COMPREHENSIVE METABOLIC PANEL
ALT: 10 U/L (ref 0–44)
AST: 15 U/L (ref 15–41)
Albumin: 3.9 g/dL (ref 3.5–5.0)
Alkaline Phosphatase: 98 U/L (ref 38–126)
Anion gap: 8 (ref 5–15)
BUN: 12 mg/dL (ref 8–23)
CO2: 25 mmol/L (ref 22–32)
Calcium: 9.7 mg/dL (ref 8.9–10.3)
Chloride: 103 mmol/L (ref 98–111)
Creatinine, Ser: 1.17 mg/dL — ABNORMAL HIGH (ref 0.44–1.00)
GFR, Estimated: 48 mL/min — ABNORMAL LOW (ref >60)
Glucose, Bld: 105 mg/dL — ABNORMAL HIGH (ref 70–99)
Potassium: 3.1 mmol/L — ABNORMAL LOW (ref 3.5–5.1)
Sodium: 136 mmol/L (ref 135–145)
Total Bilirubin: 0.6 mg/dL (ref 0.3–1.2)
Total Protein: 7.3 g/dL (ref 6.5–8.1)

## 2020-08-26 LAB — PLATELET MAPPING ADP AND AA
AA Aggregation: 1.8 % — ABNORMAL LOW (ref 89–100)
AA Inhibition: 98.2 % — ABNORMAL HIGH (ref 0–11)
ADP Aggregation: 7.3 % — ABNORMAL LOW (ref 83–100)
ADP Inhibition: 92.7 % — ABNORMAL HIGH (ref 0–17)
Activator F: 18.7 mm (ref 2–19)
Adenosine 5 Diphosphate (MA): 22.4 mm — ABNORMAL LOW (ref 45–69)
Arachidonic Acid (MA): 19.6 mm — ABNORMAL LOW (ref 51–71)
Kaolin with Heparinase: 69.6 mm — ABNORMAL HIGH (ref 53–68)

## 2020-08-26 LAB — URINALYSIS, ROUTINE W REFLEX MICROSCOPIC
Bilirubin Urine: NEGATIVE
Glucose, UA: NEGATIVE mg/dL
Ketones, ur: NEGATIVE mg/dL
Nitrite: NEGATIVE
Protein, ur: 30 mg/dL — AB
Specific Gravity, Urine: 1.013 (ref 1.005–1.030)
pH: 5 (ref 5.0–8.0)

## 2020-08-26 LAB — TYPE AND SCREEN
ABO/RH(D): A POS
Antibody Screen: NEGATIVE

## 2020-08-26 LAB — SURGICAL PCR SCREEN
MRSA, PCR: NEGATIVE
Staphylococcus aureus: POSITIVE — AB

## 2020-08-26 LAB — CBC
HCT: 38.9 % (ref 36.0–46.0)
Hemoglobin: 12.6 g/dL (ref 12.0–15.0)
MCH: 24.7 pg — ABNORMAL LOW (ref 26.0–34.0)
MCHC: 32.4 g/dL (ref 30.0–36.0)
MCV: 76.1 fL — ABNORMAL LOW (ref 80.0–100.0)
Platelets: 294 10*3/uL (ref 150–400)
RBC: 5.11 MIL/uL (ref 3.87–5.11)
RDW: 17.1 % — ABNORMAL HIGH (ref 11.5–15.5)
WBC: 9 10*3/uL (ref 4.0–10.5)
nRBC: 0 % (ref 0.0–0.2)

## 2020-08-26 LAB — PROTIME-INR
INR: 1.1 (ref 0.8–1.2)
Prothrombin Time: 14.1 seconds (ref 11.4–15.2)

## 2020-08-26 LAB — SARS CORONAVIRUS 2 (TAT 6-24 HRS): SARS Coronavirus 2: NEGATIVE

## 2020-08-26 LAB — MISC LABCORP TEST (SEND OUT): Labcorp test code: 164226

## 2020-08-26 LAB — B. BURGDORFI ANTIBODIES

## 2020-08-26 LAB — APTT: aPTT: 29 seconds (ref 24–36)

## 2020-08-26 MED ORDER — CIPROFLOXACIN HCL 500 MG PO TABS: 500.0000 mg | ORAL_TABLET | Freq: Two times a day (BID) | ORAL | 0 refills | Status: DC

## 2020-08-26 NOTE — Progress Notes (Signed)
PCP: [facility] Internal Medicine Residency Cardiologist: Hardin Negus PA-C x1  EKG: 08-24-20 CXR: n/a ECHO: 08-22-20 Stress Test: denies Cardiac Cath: denies  Covid tested during appt today, aware to quarantine.  [name] with anesthesia made aware of recent admission.   Patient denies shortness of breath, fever, cough, and chest pain at PAT appointment.  Patient verbalized understanding of instructions provided today at the PAT appointment.  Patient asked to review instructions at home and day of surgery.

## 2020-08-26 NOTE — Progress Notes (Signed)
Called and left message with surgeon office regarding labs today, specifically UA.

## 2020-08-26 NOTE — Telephone Encounter (Signed)
PAT called to report potential UTI from urinalysis. Contacted MD, okay to leave patient on schedule tomorrow for TCAR - sent in 7 day script of Cipro and informed patient.

## 2020-08-27 ENCOUNTER — Encounter (HOSPITAL_COMMUNITY)
Admission: RE | Disposition: A | Discharge status: Home / Self Care | Payer: Self-pay | Source: Home / Self Care | Attending: Vascular Surgery

## 2020-08-27 ENCOUNTER — Encounter (HOSPITAL_COMMUNITY): Payer: Self-pay | Admitting: Vascular Surgery

## 2020-08-27 ENCOUNTER — Inpatient Hospital Stay (HOSPITAL_COMMUNITY): Payer: Medicare HMO | Admitting: Physician Assistant

## 2020-08-27 ENCOUNTER — Inpatient Hospital Stay (HOSPITAL_COMMUNITY)
Admission: RE | Admit: 2020-08-27 | Discharge: 2020-08-29 | DRG: 035 | Disposition: A | Discharge status: Home / Self Care | Payer: Medicare HMO | Source: Ambulatory Visit | Attending: Vascular Surgery | Admitting: Vascular Surgery

## 2020-08-27 ENCOUNTER — Inpatient Hospital Stay (HOSPITAL_COMMUNITY): Payer: Medicare HMO

## 2020-08-27 ENCOUNTER — Encounter: Payer: Medicare HMO | Admitting: Student

## 2020-08-27 DIAGNOSIS — M1711 Unilateral primary osteoarthritis, right knee: Secondary | ICD-10-CM | POA: Diagnosis present

## 2020-08-27 DIAGNOSIS — Z885 Allergy status to narcotic agent status: Secondary | ICD-10-CM | POA: Diagnosis not present

## 2020-08-27 DIAGNOSIS — I10 Essential (primary) hypertension: Secondary | ICD-10-CM | POA: Diagnosis not present

## 2020-08-27 DIAGNOSIS — Z888 Allergy status to other drugs, medicaments and biological substances status: Secondary | ICD-10-CM

## 2020-08-27 DIAGNOSIS — Z8249 Family history of ischemic heart disease and other diseases of the circulatory system: Secondary | ICD-10-CM | POA: Diagnosis not present

## 2020-08-27 DIAGNOSIS — R490 Dysphonia: Secondary | ICD-10-CM | POA: Diagnosis not present

## 2020-08-27 DIAGNOSIS — Z8261 Family history of arthritis: Secondary | ICD-10-CM

## 2020-08-27 DIAGNOSIS — I491 Atrial premature depolarization: Secondary | ICD-10-CM | POA: Diagnosis not present

## 2020-08-27 DIAGNOSIS — Z87891 Personal history of nicotine dependence: Secondary | ICD-10-CM | POA: Diagnosis not present

## 2020-08-27 DIAGNOSIS — I6521 Occlusion and stenosis of right carotid artery: Secondary | ICD-10-CM | POA: Diagnosis not present

## 2020-08-27 DIAGNOSIS — E785 Hyperlipidemia, unspecified: Secondary | ICD-10-CM | POA: Diagnosis present

## 2020-08-27 DIAGNOSIS — H5340 Unspecified visual field defects: Secondary | ICD-10-CM | POA: Diagnosis present

## 2020-08-27 DIAGNOSIS — F419 Anxiety disorder, unspecified: Secondary | ICD-10-CM | POA: Diagnosis present

## 2020-08-27 DIAGNOSIS — H349 Unspecified retinal vascular occlusion: Secondary | ICD-10-CM | POA: Diagnosis not present

## 2020-08-27 DIAGNOSIS — I6522 Occlusion and stenosis of left carotid artery: Principal | ICD-10-CM | POA: Diagnosis present

## 2020-08-27 DIAGNOSIS — N179 Acute kidney failure, unspecified: Secondary | ICD-10-CM | POA: Diagnosis not present

## 2020-08-27 DIAGNOSIS — Z79899 Other long term (current) drug therapy: Secondary | ICD-10-CM

## 2020-08-27 DIAGNOSIS — Z20822 Contact with and (suspected) exposure to covid-19: Secondary | ICD-10-CM | POA: Diagnosis present

## 2020-08-27 DIAGNOSIS — R69 Illness, unspecified: Secondary | ICD-10-CM | POA: Diagnosis not present

## 2020-08-27 HISTORY — PX: TRANSCAROTID ARTERY REVASCULARIZATIONÂ: SHX6778

## 2020-08-27 HISTORY — PX: ULTRASOUND GUIDANCE FOR VASCULAR ACCESS: SHX6516

## 2020-08-27 LAB — QUANTIFERON-TB GOLD PLUS: QuantiFERON-TB Gold Plus: NEGATIVE

## 2020-08-27 LAB — POCT ACTIVATED CLOTTING TIME
Activated Clotting Time: 202 seconds
Activated Clotting Time: 369 seconds

## 2020-08-27 LAB — QUANTIFERON-TB GOLD PLUS (RQFGPL)
QuantiFERON Mitogen Value: 4.19 IU/mL
QuantiFERON Nil Value: 0.01 IU/mL
QuantiFERON TB1 Ag Value: 0.01 IU/mL
QuantiFERON TB2 Ag Value: 0.02 IU/mL

## 2020-08-27 LAB — ABO/RH: ABO/RH(D): A POS

## 2020-08-27 LAB — ANCA TITERS
Atypical P-ANCA titer: <1:20 {titer}
C-ANCA: <1:20 {titer}
P-ANCA: <1:20 {titer}

## 2020-08-27 SURGERY — TRANSCAROTID ARTERY REVASCULARIZATION (TCAR)
Anesthesia: General | Site: Neck | Laterality: Right

## 2020-08-27 MED ORDER — EPHEDRINE SULFATE-NACL 50-0.9 MG/10ML-% IV SOSY
PREFILLED_SYRINGE | INTRAVENOUS | Status: DC | PRN
  Administered 2020-08-27: 5 mg via INTRAVENOUS
  Administered 2020-08-27 (×3): 10 mg via INTRAVENOUS
  Administered 2020-08-27: 5 mg via INTRAVENOUS

## 2020-08-27 MED ORDER — ASPIRIN EC 81 MG PO TBEC
81.0000 mg | DELAYED_RELEASE_TABLET | Freq: Every day | ORAL | Status: DC
  Administered 2020-08-28 – 2020-08-29 (×2): 81 mg via ORAL
  Filled 2020-08-27 (×2): qty 1

## 2020-08-27 MED ORDER — ALPRAZOLAM 0.5 MG PO TABS: 0.5000 mg | ORAL_TABLET | Freq: Every evening | ORAL | Status: DC | PRN

## 2020-08-27 MED ORDER — POTASSIUM CHLORIDE CRYS ER 20 MEQ PO TBCR: 20.0000 meq | EXTENDED_RELEASE_TABLET | Freq: Every day | ORAL | Status: DC | PRN

## 2020-08-27 MED ORDER — VANCOMYCIN HCL IN DEXTROSE 1-5 GM/200ML-% IV SOLN
INTRAVENOUS | Status: AC
  Filled 2020-08-27: qty 200

## 2020-08-27 MED ORDER — CHLORHEXIDINE GLUCONATE CLOTH 2 % EX PADS: 6.0000 | MEDICATED_PAD | Freq: Once | CUTANEOUS | Status: DC

## 2020-08-27 MED ORDER — MIDAZOLAM HCL 2 MG/2ML IJ SOLN
INTRAMUSCULAR | Status: AC
  Filled 2020-08-27: qty 2

## 2020-08-27 MED ORDER — MAGNESIUM SULFATE 2 GM/50ML IV SOLN: 2.0000 g | Freq: Every day | INTRAVENOUS | Status: DC | PRN

## 2020-08-27 MED ORDER — PROPOFOL 10 MG/ML IV BOLUS
INTRAVENOUS | Status: AC
  Filled 2020-08-27: qty 40

## 2020-08-27 MED ORDER — ALBUTEROL SULFATE (2.5 MG/3ML) 0.083% IN NEBU: 3.0000 mL | INHALATION_SOLUTION | Freq: Four times a day (QID) | RESPIRATORY_TRACT | Status: DC | PRN

## 2020-08-27 MED ORDER — LIDOCAINE 2% (20 MG/ML) 5 ML SYRINGE
INTRAMUSCULAR | Status: AC
  Filled 2020-08-27: qty 5

## 2020-08-27 MED ORDER — HYDRALAZINE HCL 20 MG/ML IJ SOLN: 5.0000 mg | INTRAMUSCULAR | Status: DC | PRN

## 2020-08-27 MED ORDER — PHENYLEPHRINE HCL-NACL 10-0.9 MG/250ML-% IV SOLN
INTRAVENOUS | Status: DC | PRN
  Administered 2020-08-27: 30 ug/min via INTRAVENOUS

## 2020-08-27 MED ORDER — ONDANSETRON HCL 4 MG/2ML IJ SOLN: 4.0000 mg | Freq: Four times a day (QID) | INTRAMUSCULAR | Status: DC | PRN

## 2020-08-27 MED ORDER — LABETALOL HCL 5 MG/ML IV SOLN: 10.0000 mg | INTRAVENOUS | Status: DC | PRN

## 2020-08-27 MED ORDER — SODIUM CHLORIDE 0.9 % IV SOLN
INTRAVENOUS | Status: DC | PRN
  Administered 2020-08-27: 500 mL

## 2020-08-27 MED ORDER — VANCOMYCIN HCL IN DEXTROSE 1-5 GM/200ML-% IV SOLN
1000.0000 mg | INTRAVENOUS | Status: AC
  Administered 2020-08-27: 1000 mg via INTRAVENOUS

## 2020-08-27 MED ORDER — DEXAMETHASONE SODIUM PHOSPHATE 10 MG/ML IJ SOLN
INTRAMUSCULAR | Status: DC | PRN
  Administered 2020-08-27: 5 mg via INTRAVENOUS

## 2020-08-27 MED ORDER — CHLORHEXIDINE GLUCONATE 0.12 % MT SOLN: 15.0000 mL | Freq: Once | OROMUCOSAL | Status: AC

## 2020-08-27 MED ORDER — ALUM & MAG HYDROXIDE-SIMETH 200-200-20 MG/5ML PO SUSP: 15.0000 mL | ORAL | Status: DC | PRN

## 2020-08-27 MED ORDER — TRAMADOL HCL 50 MG PO TABS
50.0000 mg | ORAL_TABLET | Freq: Four times a day (QID) | ORAL | Status: DC | PRN
Start: 2020-08-27 — End: 2020-08-29
  Administered 2020-08-28 – 2020-08-29 (×2): 50 mg via ORAL
  Filled 2020-08-27 (×2): qty 1

## 2020-08-27 MED ORDER — ACETAMINOPHEN 325 MG PO TABS: 325.0000 mg | ORAL_TABLET | ORAL | Status: DC | PRN

## 2020-08-27 MED ORDER — DOCUSATE SODIUM 100 MG PO CAPS
100.0000 mg | ORAL_CAPSULE | Freq: Every day | ORAL | Status: DC
  Filled 2020-08-27 (×2): qty 1

## 2020-08-27 MED ORDER — PROTAMINE SULFATE 10 MG/ML IV SOLN
INTRAVENOUS | Status: DC | PRN
  Administered 2020-08-27: 50 mg via INTRAVENOUS

## 2020-08-27 MED ORDER — ACETAMINOPHEN 650 MG RE SUPP: 325.0000 mg | RECTAL | Status: DC | PRN

## 2020-08-27 MED ORDER — ONDANSETRON HCL 4 MG/2ML IJ SOLN
INTRAMUSCULAR | Status: AC
  Filled 2020-08-27: qty 2

## 2020-08-27 MED ORDER — BISACODYL 10 MG RE SUPP: 10.0000 mg | Freq: Every day | RECTAL | Status: DC | PRN

## 2020-08-27 MED ORDER — ONDANSETRON HCL 4 MG/2ML IJ SOLN: 4.0000 mg | Freq: Once | INTRAMUSCULAR | Status: DC | PRN

## 2020-08-27 MED ORDER — ROCURONIUM BROMIDE 10 MG/ML (PF) SYRINGE
PREFILLED_SYRINGE | INTRAVENOUS | Status: DC | PRN
  Administered 2020-08-27: 60 mg via INTRAVENOUS

## 2020-08-27 MED ORDER — GLYCOPYRROLATE PF 0.2 MG/ML IJ SOSY
PREFILLED_SYRINGE | INTRAMUSCULAR | Status: DC | PRN
  Administered 2020-08-27 (×3): .2 mg via INTRAVENOUS

## 2020-08-27 MED ORDER — PROPOFOL 10 MG/ML IV BOLUS
INTRAVENOUS | Status: DC | PRN
  Administered 2020-08-27: 70 mg via INTRAVENOUS

## 2020-08-27 MED ORDER — AMLODIPINE BESYLATE 10 MG PO TABS
10.0000 mg | ORAL_TABLET | Freq: Every day | ORAL | Status: DC
  Administered 2020-08-28 – 2020-08-29 (×2): 10 mg via ORAL
  Filled 2020-08-27 (×2): qty 1

## 2020-08-27 MED ORDER — PANTOPRAZOLE SODIUM 40 MG PO TBEC
40.0000 mg | DELAYED_RELEASE_TABLET | Freq: Every day | ORAL | Status: DC
  Administered 2020-08-28 – 2020-08-29 (×2): 40 mg via ORAL
  Filled 2020-08-27 (×3): qty 1

## 2020-08-27 MED ORDER — SUGAMMADEX SODIUM 200 MG/2ML IV SOLN
INTRAVENOUS | Status: DC | PRN
  Administered 2020-08-27: 200 mg via INTRAVENOUS

## 2020-08-27 MED ORDER — ONDANSETRON HCL 4 MG/2ML IJ SOLN
INTRAMUSCULAR | Status: DC | PRN
  Administered 2020-08-27: 4 mg via INTRAVENOUS

## 2020-08-27 MED ORDER — ATORVASTATIN CALCIUM 40 MG PO TABS
40.0000 mg | ORAL_TABLET | Freq: Every day | ORAL | Status: DC
  Administered 2020-08-28 – 2020-08-29 (×2): 40 mg via ORAL
  Filled 2020-08-27 (×2): qty 1

## 2020-08-27 MED ORDER — EPHEDRINE 5 MG/ML INJ
INTRAVENOUS | Status: AC
  Filled 2020-08-27: qty 10

## 2020-08-27 MED ORDER — DEXAMETHASONE SODIUM PHOSPHATE 10 MG/ML IJ SOLN
INTRAMUSCULAR | Status: AC
  Filled 2020-08-27: qty 1

## 2020-08-27 MED ORDER — METOPROLOL TARTRATE 5 MG/5ML IV SOLN: 2.0000 mg | INTRAVENOUS | Status: DC | PRN

## 2020-08-27 MED ORDER — CARVEDILOL 3.125 MG PO TABS
3.1250 mg | ORAL_TABLET | Freq: Two times a day (BID) | ORAL | Status: DC
  Administered 2020-08-27 – 2020-08-29 (×4): 3.125 mg via ORAL
  Filled 2020-08-27 (×4): qty 1

## 2020-08-27 MED ORDER — ORAL CARE MOUTH RINSE: 15.0000 mL | Freq: Once | OROMUCOSAL | Status: AC

## 2020-08-27 MED ORDER — ROCURONIUM BROMIDE 10 MG/ML (PF) SYRINGE
PREFILLED_SYRINGE | INTRAVENOUS | Status: AC
  Filled 2020-08-27: qty 10

## 2020-08-27 MED ORDER — HEPARIN SODIUM (PORCINE) 1000 UNIT/ML IJ SOLN
INTRAMUSCULAR | Status: DC | PRN
  Administered 2020-08-27: 8000 [IU] via INTRAVENOUS
  Administered 2020-08-27: 4000 [IU] via INTRAVENOUS

## 2020-08-27 MED ORDER — CLOPIDOGREL BISULFATE 75 MG PO TABS
75.0000 mg | ORAL_TABLET | Freq: Every day | ORAL | Status: DC
  Administered 2020-08-28 – 2020-08-29 (×2): 75 mg via ORAL
  Filled 2020-08-27 (×2): qty 1

## 2020-08-27 MED ORDER — PROPOFOL 500 MG/50ML IV EMUL
INTRAVENOUS | Status: DC | PRN
  Administered 2020-08-27: 125 ug/kg/min via INTRAVENOUS

## 2020-08-27 MED ORDER — LIDOCAINE 2% (20 MG/ML) 5 ML SYRINGE
INTRAMUSCULAR | Status: DC | PRN
  Administered 2020-08-27: 100 mg via INTRAVENOUS

## 2020-08-27 MED ORDER — VANCOMYCIN HCL 1000 MG/200ML IV SOLN
1000.0000 mg | Freq: Two times a day (BID) | INTRAVENOUS | Status: AC
  Administered 2020-08-27 – 2020-08-28 (×2): 1000 mg via INTRAVENOUS
  Filled 2020-08-27 (×2): qty 200

## 2020-08-27 MED ORDER — POLYETHYLENE GLYCOL 3350 17 G PO PACK: 17.0000 g | PACK | Freq: Every day | ORAL | Status: DC | PRN

## 2020-08-27 MED ORDER — IODIXANOL 320 MG/ML IV SOLN
INTRAVENOUS | Status: DC | PRN
  Administered 2020-08-27: 21 mL via INTRA_ARTERIAL

## 2020-08-27 MED ORDER — HYDROMORPHONE HCL 1 MG/ML IJ SOLN
0.5000 mg | INTRAMUSCULAR | Status: DC | PRN
  Administered 2020-08-27 (×2): 0.5 mg via INTRAVENOUS
  Filled 2020-08-27 (×2): qty 1

## 2020-08-27 MED ORDER — HYDROMORPHONE HCL 1 MG/ML IJ SOLN: 0.2500 mg | INTRAMUSCULAR | Status: DC | PRN

## 2020-08-27 MED ORDER — LACTATED RINGERS IV SOLN: INTRAVENOUS | Status: DC

## 2020-08-27 MED ORDER — SODIUM CHLORIDE 0.9 % IV SOLN: INTRAVENOUS | Status: DC

## 2020-08-27 MED ORDER — 0.9 % SODIUM CHLORIDE (POUR BTL) OPTIME
TOPICAL | Status: DC | PRN
  Administered 2020-08-27: 1000 mL

## 2020-08-27 MED ORDER — FENTANYL CITRATE (PF) 250 MCG/5ML IJ SOLN
INTRAMUSCULAR | Status: AC
  Filled 2020-08-27: qty 5

## 2020-08-27 MED ORDER — GUAIFENESIN-DM 100-10 MG/5ML PO SYRP: 15.0000 mL | ORAL_SOLUTION | ORAL | Status: DC | PRN

## 2020-08-27 MED ORDER — SODIUM CHLORIDE 0.9 % IV SOLN: 500.0000 mL | Freq: Once | INTRAVENOUS | Status: DC | PRN

## 2020-08-27 MED ORDER — CIPROFLOXACIN HCL 500 MG PO TABS
500.0000 mg | ORAL_TABLET | Freq: Two times a day (BID) | ORAL | Status: DC
  Administered 2020-08-27 – 2020-08-29 (×4): 500 mg via ORAL
  Filled 2020-08-27 (×4): qty 1

## 2020-08-27 MED ORDER — HEPARIN SODIUM (PORCINE) 1000 UNIT/ML IJ SOLN
INTRAMUSCULAR | Status: AC
  Filled 2020-08-27: qty 1

## 2020-08-27 MED ORDER — FENTANYL CITRATE (PF) 250 MCG/5ML IJ SOLN
INTRAMUSCULAR | Status: DC | PRN
  Administered 2020-08-27: 50 ug via INTRAVENOUS

## 2020-08-27 MED ORDER — CHLORHEXIDINE GLUCONATE 0.12 % MT SOLN
OROMUCOSAL | Status: AC
  Administered 2020-08-27: 15 mL via OROMUCOSAL
  Filled 2020-08-27: qty 15

## 2020-08-27 MED ORDER — HEMOSTATIC AGENTS (NO CHARGE) OPTIME
TOPICAL | Status: DC | PRN
  Administered 2020-08-27: 1 via TOPICAL

## 2020-08-27 MED ORDER — GLYCOPYRROLATE PF 0.2 MG/ML IJ SOSY
PREFILLED_SYRINGE | INTRAMUSCULAR | Status: AC
  Filled 2020-08-27: qty 1

## 2020-08-27 MED ORDER — PHENOL 1.4 % MT LIQD: 1.0000 | OROMUCOSAL | Status: DC | PRN

## 2020-08-27 SURGICAL SUPPLY — 56 items
BAG BANDED W/RUBBER/TAPE 36X54 (MISCELLANEOUS) ×3 IMPLANT
BALLN STERLING RX 6X30X80 (BALLOONS) ×3
BALLOON STERLING RX 6X30X80 (BALLOONS) ×2 IMPLANT
CANISTER SUCT 3000ML PPV (MISCELLANEOUS) ×3 IMPLANT
CLIP VESOCCLUDE MED 6/CT (CLIP) ×3 IMPLANT
CLIP VESOCCLUDE SM WIDE 6/CT (CLIP) ×6 IMPLANT
COVER DOME SNAP 22 D (MISCELLANEOUS) ×3 IMPLANT
COVER PROBE W GEL 5X96 (DRAPES) ×3 IMPLANT
DERMABOND ADVANCED (GAUZE/BANDAGES/DRESSINGS) ×1
DERMABOND ADVANCED .7 DNX12 (GAUZE/BANDAGES/DRESSINGS) ×2 IMPLANT
DRAPE FEMORAL ANGIO 80X135IN (DRAPES) ×3 IMPLANT
ELECT REM PT RETURN 9FT ADLT (ELECTROSURGICAL) ×3
ELECTRODE REM PT RTRN 9FT ADLT (ELECTROSURGICAL) ×2 IMPLANT
GLOVE BIO SURGEON STRL SZ7.5 (GLOVE) ×3 IMPLANT
GOWN STRL REUS W/ TWL LRG LVL3 (GOWN DISPOSABLE) ×4 IMPLANT
GOWN STRL REUS W/ TWL XL LVL3 (GOWN DISPOSABLE) ×2 IMPLANT
GOWN STRL REUS W/TWL LRG LVL3 (GOWN DISPOSABLE) ×2
GOWN STRL REUS W/TWL XL LVL3 (GOWN DISPOSABLE) ×1
GUIDEWIRE ENROUTE 0.014 (WIRE) ×3 IMPLANT
HEMOSTAT SNOW SURGICEL 2X4 (HEMOSTASIS) IMPLANT
INTRODUCER KIT GALT 7CM (INTRODUCER) ×1
KIT BASIN OR (CUSTOM PROCEDURE TRAY) ×3 IMPLANT
KIT ENCORE 26 ADVANTAGE (KITS) ×3 IMPLANT
KIT INTRODUCER GALT 7 (INTRODUCER) ×2 IMPLANT
KIT TURNOVER KIT B (KITS) ×3 IMPLANT
NEEDLE HYPO 25GX1X1/2 BEV (NEEDLE) IMPLANT
PACK CAROTID (CUSTOM PROCEDURE TRAY) ×3 IMPLANT
POSITIONER HEAD DONUT 9IN (MISCELLANEOUS) ×3 IMPLANT
POWDER SURGICEL 3.0 GRAM (HEMOSTASIS) ×3 IMPLANT
PROTECTION STATION PRESSURIZED (MISCELLANEOUS) ×3
SET MICROPUNCTURE 5F STIFF (MISCELLANEOUS) ×3 IMPLANT
SHEATH AVANTI 11CM 5FR (SHEATH) IMPLANT
SHUNT CAROTID BYPASS 10 (VASCULAR PRODUCTS) IMPLANT
SHUNT CAROTID BYPASS 12FRX15.5 (VASCULAR PRODUCTS) IMPLANT
STATION PROTECTION PRESSURIZED (MISCELLANEOUS) ×2 IMPLANT
STENT TRANSCAROTID SYSTEM 8X40 (Permanent Stent) ×3 IMPLANT
SUT MNCRL AB 4-0 PS2 18 (SUTURE) ×3 IMPLANT
SUT PROLENE 5 0 C 1 24 (SUTURE) ×3 IMPLANT
SUT PROLENE 6 0 BV (SUTURE) IMPLANT
SUT PROLENE 7 0 BV 1 (SUTURE) IMPLANT
SUT SILK 2 0 PERMA HAND 18 BK (SUTURE) ×3 IMPLANT
SUT SILK 2 0 SH CR/8 (SUTURE) IMPLANT
SUT SILK 3 0 (SUTURE)
SUT SILK 3-0 18XBRD TIE 12 (SUTURE) IMPLANT
SUT VIC AB 3-0 SH 27 (SUTURE) ×2
SUT VIC AB 3-0 SH 27X BRD (SUTURE) ×4 IMPLANT
SYR 10ML LL (SYRINGE) ×9 IMPLANT
SYR 20ML LL LF (SYRINGE) ×3 IMPLANT
SYR CONTROL 10ML LL (SYRINGE) IMPLANT
SYSTEM TRANSCAROTID NEUROPRTCT (MISCELLANEOUS) ×2 IMPLANT
TOWEL GREEN STERILE (TOWEL DISPOSABLE) ×3 IMPLANT
TRANSCAROTID NEUROPROTECT SYS (MISCELLANEOUS) ×3
TUBING ART PRESS 48 MALE/FEM (TUBING) IMPLANT
TUBING EXTENTION W/L.L. (IV SETS) IMPLANT
WATER STERILE IRR 1000ML POUR (IV SOLUTION) ×3 IMPLANT
WIRE BENTSON .035X145CM (WIRE) ×3 IMPLANT

## 2020-08-27 NOTE — Discharge Instructions (Signed)
   [facility] [place]  Discharge Instructions   Carotid Surgery  Please refer to the following instructions for your post-procedure care. Your surgeon or physician assistant will discuss any changes with you.  Activity  You are encouraged to walk as much as you can. You can slowly return to normal activities but must avoid strenuous activity and heavy lifting until your doctor tell you it's okay. Avoid activities such as vacuuming or swinging a golf club. You can drive after one week if you are comfortable and you are no longer taking prescription pain medications. It is normal to feel tired for serval weeks after your surgery. It is also normal to have difficulty with sleep habits, eating, and bowel movements after surgery. These will go away with time.  Bathing/Showering  Shower daily after you go home. Do not soak in a bathtub, hot tub, or swim until the incision heals completely.  Incision Care  Shower every day. Clean your incision with mild soap and water. Pat the area dry with a clean towel. You do not need a bandage unless otherwise instructed. Do not apply any ointments or creams to your incision. You may have skin glue on your incision. Do not peel it off. It will come off on its own in about one week. Your incision may feel thickened and raised for several weeks after your surgery. This is normal and the skin will soften over time.   For Men Only: It's okay to shave around the incision but do not shave the incision itself for 2 weeks. It is common to have numbness under your chin that could last for several months.  Diet  Resume your normal diet. There are no special food restrictions following this procedure. A low fat/low cholesterol diet is recommended for all patients with vascular disease. In order to heal from your surgery, it is CRITICAL to get adequate nutrition. Your body requires vitamins, minerals, and protein. Vegetables are the best source of  vitamins and minerals. Vegetables also provide the perfect balance of protein. Processed food has little nutritional value, so try to avoid this.  Medications  Resume taking all of your medications unless your doctor or physician assistant tells you not to. If your incision is causing pain, you may take over-the- counter pain relievers such as acetaminophen (Tylenol). If you were prescribed a stronger pain medication, please be aware these medications can cause nausea and constipation. Prevent nausea by taking the medication with a snack or meal. Avoid constipation by drinking plenty of fluids and eating foods with a high amount of fiber, such as fruits, vegetables, and grains.   Do not take Tylenol if you are taking prescription pain medications.  Follow Up  Our office will schedule a follow up appointment 2-3 weeks following discharge.  Please call us immediately for any of the following conditions  . Increased pain, redness, drainage (pus) from your incision site. . Fever of 101 degrees or higher. . If you should develop stroke (slurred speech, difficulty swallowing, weakness on one side of your body, loss of vision) you should call 911 and go to the nearest emergency room. .  Reduce your risk of vascular disease:  . Stop smoking. If you would like help call [facility] at 1-800-QUIT-NOW (1-800-784-8669) or [place] at 336-586-4000. . Manage your cholesterol . Maintain a desired weight . Control your diabetes . Keep your blood pressure down .  If you have any questions, please call the office at [phone]. 

## 2020-08-27 NOTE — Anesthesia Procedure Notes (Signed)
Arterial Line Insertion Start/End5/02/2021 8:15 AM, 08/27/2020 8:25 AM Performed by: Dorthea Cove, CRNA, CRNA  Patient location: Pre-op. Preanesthetic checklist: patient identified, IV checked, site marked, risks and benefits discussed, surgical consent, monitors and equipment checked, pre-op evaluation, timeout performed and anesthesia consent Lidocaine 1% used for infiltration Right, radial was placed Catheter size: 20 Fr Hand hygiene performed  and maximum sterile barriers used   Attempts: 1 Procedure performed without using ultrasound guided technique. Following insertion, dressing applied and Biopatch. Post procedure assessment: normal and unchanged  Patient tolerated the procedure well with no immediate complications.

## 2020-08-27 NOTE — Interval H&P Note (Signed)
History and Physical Interval Note:  08/27/2020 8:25 AM  Donna Lawson  has presented today for surgery, with the diagnosis of Carotid Stenosis.  The various methods of treatment have been discussed with the patient and family. After consideration of risks, benefits and other options for treatment, the patient has consented to  Procedure(s): LEFT TRANSCAROTID ARTERY REVASCULARIZATION (Left) as a surgical intervention.  The patient's history has been reviewed, patient examined, no change in status, stable for surgery.  I have reviewed the patient's chart and labs.  Questions were answered to the patient's satisfaction.     Servando Snare

## 2020-08-27 NOTE — Anesthesia Preprocedure Evaluation (Signed)
Anesthesia Evaluation  Patient identified by MRN, date of birth, ID band Patient awake    Reviewed: Allergy & Precautions, NPO status , Patient's Chart, lab work & pertinent test results  History of Anesthesia Complications (+) PONV  Airway Mallampati: II  TM Distance: >3 FB Neck ROM: Full    Dental no notable dental hx.    Pulmonary neg pulmonary ROS, former smoker,    Pulmonary exam normal breath sounds clear to auscultation       Cardiovascular hypertension, + Peripheral Vascular Disease  Normal cardiovascular exam Rhythm:Regular Rate:Normal     Neuro/Psych negative neurological ROS  negative psych ROS   GI/Hepatic negative GI ROS, Neg liver ROS,   Endo/Other  negative endocrine ROS  Renal/GU negative Renal ROS  negative genitourinary   Musculoskeletal negative musculoskeletal ROS (+)   Abdominal   Peds negative pediatric ROS (+)  Hematology negative hematology ROS (+)   Anesthesia Other Findings   Reproductive/Obstetrics negative OB ROS                             Anesthesia Physical Anesthesia Plan  ASA: III  Anesthesia Plan: General   Post-op Pain Management:    Induction: Intravenous  PONV Risk Score and Plan: 4 or greater and Ondansetron, Dexamethasone and Treatment may vary due to age or medical condition  Airway Management Planned: Oral ETT  Additional Equipment: Arterial line  Intra-op Plan:   Post-operative Plan: Extubation in OR  Informed Consent: I have reviewed the patients History and Physical, chart, labs and discussed the procedure including the risks, benefits and alternatives for the proposed anesthesia with the patient or authorized representative who has indicated his/her understanding and acceptance.     Dental advisory given  Plan Discussed with: CRNA and Surgeon  Anesthesia Plan Comments:         Anesthesia Quick Evaluation

## 2020-08-27 NOTE — Anesthesia Procedure Notes (Signed)
Procedure Name: Intubation Date/Time: 08/27/2020 8:37 AM Performed by: Dorthea Cove, CRNA Pre-anesthesia Checklist: Patient identified, Emergency Drugs available, Suction available and Patient being monitored Patient Re-evaluated:Patient Re-evaluated prior to induction Oxygen Delivery Method: Circle system utilized Preoxygenation: Pre-oxygenation with 100% oxygen Induction Type: IV induction Ventilation: Mask ventilation without difficulty and Oral airway inserted - appropriate to patient size Laryngoscope Size: Mac and 4 Grade View: Grade I Tube type: Oral Tube size: 7.0 mm Number of attempts: 1 Airway Equipment and Method: Stylet and Oral airway Placement Confirmation: ETT inserted through vocal cords under direct vision,  positive ETCO2 and breath sounds checked- equal and bilateral Secured at: 21 cm Tube secured with: Tape Dental Injury: Teeth and Oropharynx as per pre-operative assessment

## 2020-08-27 NOTE — Progress Notes (Signed)
Mobility Specialist: Progress Note   08/27/20 1734  Mobility  Activity Ambulated in room  Level of Assistance Minimal assist, patient does 75% or more  Assistive Device Front wheel walker  Distance Ambulated (ft) 60 ft  Mobility Ambulated with assistance in room  Mobility Response Tolerated well  Mobility performed by Mobility specialist  $Mobility charge 1 Mobility   Pre-Mobility: 79 HR, 93% SpO2 Post-Mobility: 80 HR, 97% SpO2  Pt ambulated on 2 L/min [place]. Pt was minA to sit from supine to EOB and from EOB to stand. Pt c/o neck pain during ambulation, otherwise asx. Pt back in bed with call bell at her side.   [facility] Willaim Mode Mobility Specialist Mobility Specialist Phone: [phone]

## 2020-08-27 NOTE — Op Note (Signed)
Patient name: Barbarella Wiker MRN: [id number] DOB: [birth year] Sex: female  08/27/2020 Pre-operative Diagnosis: symptomatic left ica stenosis with retinal embolus Post-operative diagnosis:  Same Surgeon:  Eda Paschal. Donzetta Matters, MD Assistant: Leontine Locket, PA Procedure Performed: 1.  Transcarotid artery placement of 8 x 40 mm Enroute stent with flow reversal distal embolic protection 2.  Ultrasound-guided cannulation right common femoral vein for placement of flow reversal sheath  Indications: 75 year old female with recent history of symptomatic left ICA stenosis with retinal artery embolus and now visual field defect.  She has been placed on aspirin and Plavix and also a statin drug.  She is now indicated for stenting.  Assistant was necessary to expedite the case.  Findings: Common carotid artery was very tortuous as was the distal ICA.  A stent was placed at the ICA bifurcation extending into the common carotid artery.  This was an 8 x 40 mm stent and at completion the stent was patent by 2 views.   Procedure:  The patient was identified in the holding area and taken to the operating room where she is fully supine operative when general anesthesia induced.  She was sterilely prepped and draped in the left neck and chest as well as the bilateral groins in usual fashion, antibiotics were minister timeout was called.  Ultrasound was initially used to identify the common carotid artery this was marked on the skin.  We then used ultrasound to identify the right common femoral vein.  This was cannulated micropuncture needle followed by wire sheath.  Bentson wires placed followed by an 8 [place] sheath.  This was sutured to the skin with silk suture flushed with heparinized saline.  Vertical incision was then made on the neck.  We dissected down unfortunately initially we were lateral to the sternocleidomastoid.  We identified that subclavian artery brachial plexus we were in the wrong plane there.  We did  have lymphatic leak this was triply clipped and bovied.  We then regroup we entered between the 2 heads of the sternocleidomastoid identified the internal jugular vein.  The crossing vein was divided.  The common carotid artery was encircled with a vessel loop and umbilical tape patient was fully heparinized initially with 8000 units of heparin and an additional 4000 was given ACT was greater than 300.  A 5-0 Prolene U stitch was placed.  We then cannulated the common carotid artery placed a wire to 3 cm followed by sheath 3 cm.  Angiography was performed.  We placed a stiff wire just to the proximal to the bifurcation and then placed our 8 French flow reversal sheath.  This was fixed to the skin with silk suture.  We then establish flow reversal and confirmed flow.  After ACT returned returned greater than 300 we performed a TCAR timeout and patient had received Robinul on induction.  The common carotid artery was clamped with our vessel loop and flow reversal was again confirmed.  We then used the balloon and the wire to cross the lesion.  This was primarily dilated with a 6 x 30 balloon followed by placement of 8 x 40 stent.  Completion in 2 views after 2 minutes of waiting demonstrated no residual stenosis.  Satisfied a wire was removed.  We disconnected our flow reversal in our clamp was taken down.  We then removed our sheath from the common carotid artery and cinched our 5-0 Prolene U stitch.  Doppler confirmed flow in the common and carotid artery that was expected low  resistance.  50 mg of protamine was administered.  The sheath from the right groin was removed and pressure held till hemostasis obtained.  We irrigated the wound to the neck.  We did further inspect our lymphatic leak this was clipped and ultimately sutured.  We did have to close down the lateral neck space with running 3-0 Vicryl suture.  We then reinspected our common carotid cannulation site this was hemostatic.  We then closed the  platysma with running 3-0 Vicryl suture and closed the skin with 4 Monocryl.  Dermabond placed to level the skin.  She was awakened from anesthesia she was noted to be neurologically intact and she was transferred to the recovery area in stable condition.  All counts were correct at completion.  EBL: 100cc   Keauna Brasel C. Donzetta Matters, MD [facility] [place] Office: [phone] Pager: [phone]

## 2020-08-27 NOTE — Progress Notes (Signed)
Patient brought to 4E from PACU. Telemetry box applied, CCMD notified. CHG bath completed. VSS. Patient states pain 5/10. Waiting on pharmacy to verify pain medication order.Patient oriented to room and staff. Call bell in reach. Daughter present.  Daymon Larsen, RN

## 2020-08-27 NOTE — Transfer of Care (Signed)
Immediate Anesthesia Transfer of Care Note  Patient: Donna Lawson  Procedure(s) Performed: LEFT TRANSCAROTID ARTERY REVASCULARIZATION (Left Neck) ULTRASOUND GUIDANCE FOR VASCULAR ACCESS (Right Groin)  Patient Location: PACU  Anesthesia Type:General  Level of Consciousness: awake, drowsy and patient cooperative  Airway & Oxygen Therapy: Patient Spontanous Breathing and Patient connected to face mask oxygen  Post-op Assessment: Report given to RN and Post -op Vital signs reviewed and stable  Post vital signs: Reviewed and stable  Last Vitals:  Vitals Value Taken Time  BP 118/66 08/27/20 1043  Temp    Pulse 70 08/27/20 1045  Resp 27 08/27/20 1045  SpO2 94 % 08/27/20 1045  Vitals shown include unvalidated device data.  Last Pain:  Vitals:   08/27/20 0717  TempSrc:   PainSc: 0-No pain      Patients Stated Pain Goal: 3 ([id number])  Complications: No complications documented.

## 2020-08-27 NOTE — Anesthesia Postprocedure Evaluation (Signed)
Anesthesia Post Note  Patient: Faylinn Roszak  Procedure(s) Performed: LEFT TRANSCAROTID ARTERY REVASCULARIZATION (Left Neck) ULTRASOUND GUIDANCE FOR VASCULAR ACCESS (Right Groin)     Patient location during evaluation: PACU Anesthesia Type: General Level of consciousness: awake and alert Pain management: pain level controlled Vital Signs Assessment: post-procedure vital signs reviewed and stable Respiratory status: spontaneous breathing, nonlabored ventilation, respiratory function stable and patient connected to nasal cannula oxygen Cardiovascular status: blood pressure returned to baseline and stable Postop Assessment: no apparent nausea or vomiting Anesthetic complications: no   No complications documented.  Last Vitals:  Vitals:   08/27/20 1115 08/27/20 1125  BP: 133/79   Pulse: 73 69  Resp: 19 17  Temp:  36.4 C  SpO2: 99% 99%    Last Pain:  Vitals:   08/27/20 1115  TempSrc:   PainSc: Asleep                 Lateshia Schmoker S

## 2020-08-27 NOTE — Plan of Care (Signed)
  Problem: Health Behavior/Discharge Planning: Goal: Ability to manage health-related needs will improve Outcome: Progressing   Problem: Clinical Measurements: Goal: Will remain free from infection Outcome: Progressing Goal: Diagnostic test results will improve Outcome: Progressing   

## 2020-08-28 ENCOUNTER — Other Ambulatory Visit: Payer: Self-pay

## 2020-08-28 ENCOUNTER — Encounter (HOSPITAL_COMMUNITY): Payer: Self-pay | Admitting: Vascular Surgery

## 2020-08-28 DIAGNOSIS — I6521 Occlusion and stenosis of right carotid artery: Secondary | ICD-10-CM

## 2020-08-28 LAB — CBC
HCT: 27.1 % — ABNORMAL LOW (ref 36.0–46.0)
Hemoglobin: 9 g/dL — ABNORMAL LOW (ref 12.0–15.0)
MCH: 25 pg — ABNORMAL LOW (ref 26.0–34.0)
MCHC: 33.2 g/dL (ref 30.0–36.0)
MCV: 75.3 fL — ABNORMAL LOW (ref 80.0–100.0)
Platelets: 201 10*3/uL (ref 150–400)
RBC: 3.6 MIL/uL — ABNORMAL LOW (ref 3.87–5.11)
RDW: 16.8 % — ABNORMAL HIGH (ref 11.5–15.5)
WBC: 9.2 10*3/uL (ref 4.0–10.5)
nRBC: 0 % (ref 0.0–0.2)

## 2020-08-28 LAB — LIPID PANEL
Cholesterol: 132 mg/dL (ref 0–200)
HDL: 60 mg/dL (ref >40)
LDL Cholesterol: 56 mg/dL (ref 0–99)
Total CHOL/HDL Ratio: 2.2 RATIO
Triglycerides: 81 mg/dL (ref <150)
VLDL: 16 mg/dL (ref 0–40)

## 2020-08-28 LAB — BASIC METABOLIC PANEL
Anion gap: 9 (ref 5–15)
BUN: 13 mg/dL (ref 8–23)
CO2: 21 mmol/L — ABNORMAL LOW (ref 22–32)
Calcium: 8.8 mg/dL — ABNORMAL LOW (ref 8.9–10.3)
Chloride: 103 mmol/L (ref 98–111)
Creatinine, Ser: 1.23 mg/dL — ABNORMAL HIGH (ref 0.44–1.00)
GFR, Estimated: 46 mL/min — ABNORMAL LOW (ref >60)
Glucose, Bld: 149 mg/dL — ABNORMAL HIGH (ref 70–99)
Potassium: 3.7 mmol/L (ref 3.5–5.1)
Sodium: 133 mmol/L — ABNORMAL LOW (ref 135–145)

## 2020-08-28 LAB — HLA-B27 ANTIGEN: HLA-B27: NEGATIVE

## 2020-08-28 NOTE — Discharge Summary (Incomplete)
Discharge Summary     Donna Lawson May 07, 1945 75 y.o. female  [id number]  Admission Date: 08/27/2020  Discharge Date: 08/28/2020  Physician: Thomes Lolling*  Admission Diagnosis: Carotid artery stenosis, symptomatic, right [I65.21]   HPI:   This is a 75 y.o. female with past medical history significant for hypertension, hyperlipidemia, frequent PACs not anticoagulated who was sent to the emergency department by her ophthalmologist due to partial vision loss of left eye.  Work-up included MRI brain which was negative for acute CVA.  CTA neck demonstrated 70% of the left carotid bulb.  She denies any further strokelike symptoms including slurring speech or right-sided weakness.  ESR and CRP labs are relatively normal.  Work-up also included echocardiogram which is pending.  She is a former tobacco user.  She denies claudication, rest pain, or nonhealing wounds of bilateral lower extremities.  Hospital Course:  The patient was admitted to the hospital and taken to the operating room on 08/27/2020 and underwent *** carotid endarterectomy.    Findings: ***  The pt tolerated the procedure well and was transported to the PACU in {DESC; GOOD/FAIR/POOR:18685} condition.   By POD 1, the pt neuro status ***   Recent Labs    08/26/20 1200 08/28/20 0346  NA 136 133*  K 3.1* 3.7  CL 103 103  CO2 25 21*  GLUCOSE 105* 149*  BUN 12 13  CALCIUM 9.7 8.8*   Recent Labs    08/26/20 1200 08/28/20 0346  WBC 9.0 9.2  HGB 12.6 9.0*  HCT 38.9 27.1*  PLT 294 201   Recent Labs    08/26/20 1200  INR 1.1       Discharge Diagnosis:  Carotid artery stenosis, symptomatic, right [I65.21]  Secondary Diagnosis: Patient Active Problem List   Diagnosis Date Noted  . Carotid artery stenosis, symptomatic, right 08/27/2020  . Vitamin D deficiency 08/24/2020  . Retinal artery occlusion 08/21/2020  . AKI (acute kidney injury) ([place]) 08/01/2020  . Recurrent upper abdominal  pain with history of cholecystectomy 08/01/2020  . Need for Tdap vaccination 08/01/2020  . 13-polyvalent pneumococcal conjugate vaccine administered 08/01/2020  . Hypokalemia 07/17/2020  . Chronic cough 06/18/2020  . Microcytic anemia 06/18/2020  . PAC (premature atrial contraction) 06/17/2020  . Dysuria 04/06/2017  . Anxiety 03/29/2013  . Hypertension   . Hyperlipidemia   . Osteoarthritis of knee    Past Medical History:  Diagnosis Date  . Anxiety   . Arthritis   . Diverticula, colon 1999  . Heart murmur   . History of blood transfusion   . Hyperlipidemia   . Hypertension   . Osteoarthritis of knee    right  . PONV (postoperative nausea and vomiting)     Allergies as of 08/28/2020      Reactions   Atorvastatin Other (See Comments)   Other reaction(s): myalgias   Crestor [rosuvastatin Calcium] Other (See Comments)   Other reaction(s): myalgias   Lisinopril Swelling   Tongue swelling   Cefuroxime Axetil Other (See Comments)   Other reaction(s): not feel well   Oxycodone-acetaminophen Other (See Comments)   Other reaction(s): feel weird    Med Rec must be completed prior to using this [facility]***        [facility] Discharge Instructions Carotid Endarterectomy (CEA)  Please refer to the following instructions for your post-procedure care. Your surgeon or physician assistant will discuss any changes with you.  Activity  You are encouraged to walk as much as you  can. You can slowly return to normal activities but must avoid strenuous activity and heavy lifting until your doctor tell you it's OK. Avoid activities such as vacuuming or swinging a golf club. You can drive after one week if you are comfortable and you are no longer taking prescription pain medications. It is normal to feel tired for serval weeks after your surgery. It is also normal to have difficulty with sleep habits, eating, and bowel movements after surgery. These will  go away with time.  Bathing/Showering  You may shower after you come home. Do not soak in a bathtub, hot tub, or swim until the incision heals completely.  Incision Care  Shower every day. Clean your incision with mild soap and water. Pat the area dry with a clean towel. You do not need a bandage unless otherwise instructed. Do not apply any ointments or creams to your incision. You may have skin glue on your incision. Do not peel it off. It will come off on its own in about one week. Your incision may feel thickened and raised for several weeks after your surgery. This is normal and the skin will soften over time. For Men Only: It's OK to shave around the incision but do not shave the incision itself for 2 weeks. It is common to have numbness under your chin that could last for several months.  Diet  Resume your normal diet. There are no special food restrictions following this procedure. A low fat/low cholesterol diet is recommended for all patients with vascular disease. In order to heal from your surgery, it is CRITICAL to get adequate nutrition. Your body requires vitamins, minerals, and protein. Vegetables are the best source of vitamins and minerals. Vegetables also provide the perfect balance of protein. Processed food has little nutritional value, so try to avoid this.  Medications  Resume taking all of your medications unless your doctor or physician assistant tells you not to.  If your incision is causing pain, you may take over-the- counter pain relievers such as acetaminophen (Tylenol). If you were prescribed a stronger pain medication, please be aware these medications can cause nausea and constipation.  Prevent nausea by taking the medication with a snack or meal. Avoid constipation by drinking plenty of fluids and eating foods with a high amount of fiber, such as fruits, vegetables, and grains.  Do not take Tylenol if you are taking prescription pain medications.  Follow Up  Our  office will schedule a follow up appointment 2-3 weeks following discharge.  Please call us immediately for any of the following conditions  . Increased pain, redness, drainage (pus) from your incision site. . Fever of 101 degrees or higher. . If you should develop stroke (slurred speech, difficulty swallowing, weakness on one side of your body, loss of vision) you should call 911 and go to the nearest emergency room. .  Reduce your risk of vascular disease:  . Stop smoking. If you would like help call [facility] at 1-800-QUIT-NOW 865-124-9714) or [place] at [phone]. . Manage your cholesterol . Maintain a desired weight . Control your diabetes . Keep your blood pressure down .  If you have any questions, please call the office at [phone].  Prescriptions given: 1.  Norco #12 No Refill  Disposition: home  Patient's condition: is Good  Follow up: 1. VVS in 4 weeks with carotid duplex   Leontine Locket, PA-C [facility] [phone]   --- For [facility] use ---   Modified  Rankin score at D/C (0-6): 0  IV medication needed for:  1. Hypertension: No 2. Hypotension: No  Post-op Complications: No  1. Post-op CVA or TIA: No  If yes: Event classification (right eye, left eye, right cortical, left cortical, verterobasilar, other): n/a  If yes: Timing of event (intra-op, <6 hrs post-op, >=6 hrs post-op, unknown): n/a  2. CN injury: No  If yes: CN n/a injuried   3. Myocardial infarction: No  If yes: Dx by (EKG or clinical, Troponin): n/a  4.  CHF: No  5.  Dysrhythmia (new): No  6. Wound infection: No  7. Reperfusion symptoms: No  8. Return to OR: No  If yes: return to OR for (bleeding, neurologic, other CEA incision, other): n/a  Discharge medications: Statin use:  No-allergies ASA use:  Yes   Beta blocker use:  Yes ACE-Inhibitor use:  No  ARB use:  No CCB use: Yes P2Y12 Antagonist use: Yes, [ x] Plavix, $RemoveBe'[ ]'FHGucbmTc$   Plasugrel, $RemoveBef'[ ]'xhjBZiyXoF$  Ticlopinine, $RemoveBefor'[ ]'FHdySrTQyxSG$  Ticagrelor, $RemoveBefo'[ ]'wuOKxMbCrNs$  Other, $Remov'[ ]'jEMjZV$  No for medical reason, $RemoveBeforeD'[ ]'GXpLqiPMaIupbI$  Non-compliant, $RemoveBeforeD'[ ]'LUKSKlgUmEErRT$  Not-indicated Anti-coagulant use:  No, $Re'[ ]'vYc$  Warfarin, $RemoveBe'[ ]'PTFQKeyLQ$  Rivaroxaban, $RemoveBefor'[ ]'dxPHybSnMDsa$  Dabigatran,

## 2020-08-28 NOTE — Progress Notes (Addendum)
  Progress Note    08/28/2020 7:24 AM 1 Day Post-Op  Subjective:  Says she is hoarse with sore throat but hasn't had any trouble swallowing.  Says her peripheral vision is improved.  Has some pain around her incision.  Wants to know how long she has to be on plavix b/c plavix and asa upset her stomach.  afebrile HR  Vitals:   08/27/20 2330 08/28/20 0243  BP: 138/79 (!) 155/81  Pulse:  77  Resp: 16 16  Temp: 98 F (36.7 C) 97.6 F (36.4 C)  SpO2:  98%     Physical Exam: Neuro:  In tact; tongue is midline; moving all extremities equally. Lungs:  Non labored Incision:  Left neck incision looks good with ecchymosis.  Right groin soft without hematoma  CBC    Component Value Date/Time   WBC 9.2 08/28/2020 0346   RBC 3.60 (L) 08/28/2020 0346   HGB 9.0 (L) 08/28/2020 0346   HGB 12.0 06/18/2020 1038   HCT 27.1 (L) 08/28/2020 0346   HCT 37.7 06/18/2020 1038   PLT 201 08/28/2020 0346   PLT 393 06/18/2020 1038   MCV 75.3 (L) 08/28/2020 0346   MCV 73 (L) 06/18/2020 1038   MCH 25.0 (L) 08/28/2020 0346   MCHC 33.2 08/28/2020 0346   RDW 16.8 (H) 08/28/2020 0346   RDW 16.7 (H) 06/18/2020 1038   LYMPHSABS 1.5 08/21/2020 1647   LYMPHSABS 1.5 06/18/2020 1038   MONOABS 0.4 08/21/2020 1647   EOSABS 0.1 08/21/2020 1647   EOSABS 0.2 06/18/2020 1038   BASOSABS 0.1 08/21/2020 1647   BASOSABS 0.1 06/18/2020 1038    BMET    Component Value Date/Time   NA 133 (L) 08/28/2020 0346   NA 137 07/31/2020 1153   K 3.7 08/28/2020 0346   CL 103 08/28/2020 0346   CO2 21 (L) 08/28/2020 0346   GLUCOSE 149 (H) 08/28/2020 0346   BUN 13 08/28/2020 0346   BUN 11 07/31/2020 1153   CREATININE 1.23 (H) 08/28/2020 0346   CALCIUM 8.8 (L) 08/28/2020 0346   GFRNONAA 46 (L) 08/28/2020 0346   GFRAA >60 07/03/2019 1430     Intake/Output Summary (Last 24 hours) at 08/28/2020 0724 Last data filed at 08/27/2020 2234 Gross per 24 hour  Intake 1896.84 ml  Output 100 ml  Net 1796.84 ml      Assessment/Plan:  This is a 75 y.o. female who is s/p left TCAR 1 Day Post-Op  -pt is doing well this am.   She has some hoarseness but no difficulty swallowing. -incision looks good with some ecchymosis.  Right groin soft without hematoma -pt will need to be on plavix-will d/w Dr. Donzetta Matters duration she will need to be on it. -pt neuro exam is in tact -pt has ambulated several times to the bathroom during the night -pt has voided -f/u with [facility] in 4 weeks with duplex   Leontine Locket, PA-C [facility] [phone]  I have independently interviewed and examined patient and agree with PA assessment and plan above.  She has significant bruising with Plavix and will plan for 4 weeks of Plavix with indefinite aspirin to follow.  Haidee Stogsdill C. Donzetta Matters, MD [facility] [place] Office: [phone] Pager: [phone]

## 2020-08-29 MED ORDER — HYDROCODONE-ACETAMINOPHEN 5-325 MG PO TABS: 1.0000 | ORAL_TABLET | Freq: Four times a day (QID) | ORAL | 0 refills | Status: DC | PRN

## 2020-08-29 NOTE — Discharge Summary (Signed)
Discharge Summary     Donna Lawson [birth year] 75 y.o. female  [id number]  Admission Date: 08/27/2020  Discharge Date: 08/29/2020 Physician: Thomes Lolling*  Admission Diagnosis: Carotid artery stenosis, symptomatic, right [I65.21]   HPI:   This is a 75 y.o. female with left transcarotid artery revascularization secondary to symptomatic left ICA stenosis with retinal embolus.  Hospital Course:  The patient was admitted to the hospital and taken to the operating room on 08/27/2020 and underwent left carotid endarterectomy. She tolerated the procedure well.  Findings: Common carotid artery was very tortuous as was the distal ICA.  A stent was placed at the ICA bifurcation extending into the common carotid artery.  This was an 8 x 40 mm stent and at completion the stent was patent by 2 views.  The pt tolerated the procedure well and was transported to the PACU in excellent condition.   By POD 1, the pt neuro status intact with improved peripheral vision. She was hemodynamically stable. Significant ecchymosis of incision, upper chest. No hematoma. She will continue Plavix for four weeks and transition to aspirin indefinitely.  The remainder of the hospital course consisted of increasing mobilization and increasing intake of solids without difficulty.  On POD 2, her vital signs were stable and she was afebrile. She was tolerating her diet and her pain was controlled.  She was ready for DC in satisfactory condition.   Recent Labs    08/28/20 0346  NA 133*  K 3.7  CL 103  CO2 21*  GLUCOSE 149*  BUN 13  CALCIUM 8.8*   Recent Labs    08/28/20 0346  WBC 9.2  HGB 9.0*  HCT 27.1*  PLT 201   No results for input(s): INR in the last 72 hours.   Discharge Instructions    Discharge patient   Complete by: As directed    Discharge disposition: 01-Home or Self Care   Discharge patient date: 08/28/2020      Discharge Diagnosis:  Carotid artery stenosis,  symptomatic, right [I65.21]  Secondary Diagnosis: Patient Active Problem List   Diagnosis Date Noted  . Carotid artery stenosis, symptomatic, right 08/27/2020  . Vitamin D deficiency 08/24/2020  . Retinal artery occlusion 08/21/2020  . AKI (acute kidney injury) ([place]) 08/01/2020  . Recurrent upper abdominal pain with history of cholecystectomy 08/01/2020  . Need for Tdap vaccination 08/01/2020  . 13-polyvalent pneumococcal conjugate vaccine administered 08/01/2020  . Hypokalemia 07/17/2020  . Chronic cough 06/18/2020  . Microcytic anemia 06/18/2020  . PAC (premature atrial contraction) 06/17/2020  . Dysuria 04/06/2017  . Anxiety 03/29/2013  . Hypertension   . Hyperlipidemia   . Osteoarthritis of knee    Past Medical History:  Diagnosis Date  . Anxiety   . Arthritis   . Diverticula, colon 1999  . Heart murmur   . History of blood transfusion   . Hyperlipidemia   . Hypertension   . Osteoarthritis of knee    right  . PONV (postoperative nausea and vomiting)     Allergies as of 08/29/2020      Reactions   Atorvastatin Other (See Comments)   Other reaction(s): myalgias   Crestor [rosuvastatin Calcium] Other (See Comments)   Other reaction(s): myalgias   Lisinopril Swelling   Tongue swelling   Cefuroxime Axetil Other (See Comments)   Other reaction(s): not feel well   Oxycodone-acetaminophen Other (See Comments)   Other reaction(s): feel weird      Medication List    TAKE these medications  acetaminophen 500 MG tablet Commonly known as: TYLENOL Take 500 mg by mouth every 6 (six) hours as needed for moderate pain or headache.   albuterol 108 (90 Base) MCG/ACT inhaler Commonly known as: VENTOLIN HFA Inhale 2 puffs into the lungs every 6 (six) hours as needed for wheezing or shortness of breath.   ALPRAZolam 0.5 MG tablet Commonly known as: Xanax Take 1 tablet (0.5 mg total) at bedtime as needed by mouth for anxiety or sleep.   amLODipine 10 MG tablet Commonly  known as: NORVASC Take 1 tablet (10 mg total) by mouth daily.   aspirin 81 MG EC tablet Take 1 tablet (81 mg total) by mouth daily. Swallow whole.   atorvastatin 40 MG tablet Commonly known as: LIPITOR Take 1 tablet (40 mg total) by mouth daily.   carvedilol 3.125 MG tablet Commonly known as: COREG Take 1 tablet (3.125 mg total) by mouth 2 (two) times daily with a meal.   ciprofloxacin 500 MG tablet Commonly known as: CIPRO Take 1 tablet (500 mg total) by mouth 2 (two) times daily.   clopidogrel 75 MG tablet Commonly known as: PLAVIX Take 1 tablet (75 mg total) by mouth daily.   HYDROcodone-acetaminophen 5-325 MG tablet Commonly known as: Norco Take 1 tablet by mouth every 6 (six) hours as needed for moderate pain.   polyethylene glycol 17 g packet Commonly known as: MIRALAX / GLYCOLAX Take 17 g by mouth daily as needed for mild constipation.   potassium chloride 20 MEQ packet Commonly known as: KLOR-CON Take 20 mEq by mouth daily.   Vitamin D (Ergocalciferol) 1.25 MG (50000 UNIT) Caps capsule Commonly known as: DRISDOL Take 1 capsule (50,000 Units total) by mouth every 7 (seven) days. Start taking on: Aug 31, 2020        [facility] Discharge Instructions Carotid Endarterectomy (CEA)  Please refer to the following instructions for your post-procedure care. Your surgeon or physician assistant will discuss any changes with you.  Activity  You are encouraged to walk as much as you can. You can slowly return to normal activities but must avoid strenuous activity and heavy lifting until your doctor tell you it's OK. Avoid activities such as vacuuming or swinging a golf club. You can drive after one week if you are comfortable and you are no longer taking prescription pain medications. It is normal to feel tired for serval weeks after your surgery. It is also normal to have difficulty with sleep habits, eating, and bowel movements after  surgery. These will go away with time.  Bathing/Showering  You may shower after you come home. Do not soak in a bathtub, hot tub, or swim until the incision heals completely.  Incision Care  Shower every day. Clean your incision with mild soap and water. Pat the area dry with a clean towel. You do not need a bandage unless otherwise instructed. Do not apply any ointments or creams to your incision. You may have skin glue on your incision. Do not peel it off. It will come off on its own in about one week. Your incision may feel thickened and raised for several weeks after your surgery. This is normal and the skin will soften over time. For Men Only: It's OK to shave around the incision but do not shave the incision itself for 2 weeks. It is common to have numbness under your chin that could last for several months.  Diet  Resume your normal diet. There are no  special food restrictions following this procedure. A low fat/low cholesterol diet is recommended for all patients with vascular disease. In order to heal from your surgery, it is CRITICAL to get adequate nutrition. Your body requires vitamins, minerals, and protein. Vegetables are the best source of vitamins and minerals. Vegetables also provide the perfect balance of protein. Processed food has little nutritional value, so try to avoid this.  Medications  Resume taking all of your medications unless your doctor or physician assistant tells you not to.  If your incision is causing pain, you may take over-the- counter pain relievers such as acetaminophen (Tylenol). If you were prescribed a stronger pain medication, please be aware these medications can cause nausea and constipation.  Prevent nausea by taking the medication with a snack or meal. Avoid constipation by drinking plenty of fluids and eating foods with a high amount of fiber, such as fruits, vegetables, and grains.  Do not take Tylenol if you are taking prescription pain  medications.  Follow Up  Our office will schedule a follow up appointment 2-3 weeks following discharge.  Please call us immediately for any of the following conditions  . Increased pain, redness, drainage (pus) from your incision site. . Fever of 101 degrees or higher. . If you should develop stroke (slurred speech, difficulty swallowing, weakness on one side of your body, loss of vision) you should call 911 and go to the nearest emergency room. .  Reduce your risk of vascular disease:  . Stop smoking. If you would like help call [facility] at 1-800-QUIT-NOW 909-185-5722) or [facility] at (819) 222-8164. . Manage your cholesterol . Maintain a desired weight . Control your diabetes . Keep your blood pressure down .  If you have any questions, please call the office at [phone].  Prescriptions given: 1.   Roxicet #15 No Refill 2.    Disposition: home  Patient's condition: is Good  Follow up: 1. Dr. Donzetta Matters in 2 weeks.   Risa Grill, PA-C [facility] [phone]   --- For [facility] use ---   Modified Rankin score at D/C (0-6): 0  IV medication needed for:  1. Hypertension: No 2. Hypotension: No  Post-op Complications: No  1. Post-op CVA or TIA: No  If yes: Event classification (right eye, left eye, right cortical, left cortical, verterobasilar, other):   If yes: Timing of event (intra-op, <6 hrs post-op, >=6 hrs post-op, unknown):   2. CN injury: No  If yes: CN n/a injuried   3. Myocardial infarction: No  If yes: Dx by (EKG or clinical, Troponin):   4.  CHF: No  5.  Dysrhythmia (new): No  6. Wound infection: No  7. Reperfusion symptoms: No  8. Return to OR: No  If yes: return to OR for (bleeding, neurologic, other CEA incision, other):   Discharge medications: Statin use:  Yes ASA use:  Yes   Beta blocker use:  Yes ACE-Inhibitor use:  No  ARB use:  No CCB use: No P2Y12 Antagonist use: Yes, [ x] Plavix, '[ ]'$   Plasugrel, '[ ]'$  Ticlopinine, '[ ]'$  Ticagrelor, '[ ]'$  Other, '[ ]'$  No for medical reason, '[ ]'$  Non-compliant, '[ ]'$  Not-indicated Anti-coagulant use:  No, '[ ]'$  Warfarin, '[ ]'$  Rivaroxaban, '[ ]'$  Dabigatran,

## 2020-08-29 NOTE — Progress Notes (Signed)
Progress Note    08/29/2020 8:21 AM 2 Days Post-Op  Subjective: No complaints.  Tolerating diet.  Minimal incisional soreness   Vitals:   08/29/20 0444 08/29/20 0805  BP: (!) 144/72 (!) 152/74  Pulse: 60 68  Resp: 16 17  Temp: 98.3 F (36.8 C) 98.2 F (36.8 C)  SpO2: 92% 92%    Physical Exam: General appearance: Awake, alert in no apparent distress Cardiac: Heart rate and rhythm are regular Respirations: Nonlabored Incisions: Left neck incision well approximated without bleeding or hematoma Extremities: Moves all extremities well.  5 out of 5 bilateral hand grip strength. Neurologic: She is alert and oriented x4.  Her speech is fluent.  Her tongue is midline.  Her face is symmetric.   CBC    Component Value Date/Time   WBC 9.2 08/28/2020 0346   RBC 3.60 (L) 08/28/2020 0346   HGB 9.0 (L) 08/28/2020 0346   HGB 12.0 06/18/2020 1038   HCT 27.1 (L) 08/28/2020 0346   HCT 37.7 06/18/2020 1038   PLT 201 08/28/2020 0346   PLT 393 06/18/2020 1038   MCV 75.3 (L) 08/28/2020 0346   MCV 73 (L) 06/18/2020 1038   MCH 25.0 (L) 08/28/2020 0346   MCHC 33.2 08/28/2020 0346   RDW 16.8 (H) 08/28/2020 0346   RDW 16.7 (H) 06/18/2020 1038   LYMPHSABS 1.5 08/21/2020 1647   LYMPHSABS 1.5 06/18/2020 1038   MONOABS 0.4 08/21/2020 1647   EOSABS 0.1 08/21/2020 1647   EOSABS 0.2 06/18/2020 1038   BASOSABS 0.1 08/21/2020 1647   BASOSABS 0.1 06/18/2020 1038    BMET    Component Value Date/Time   NA 133 (L) 08/28/2020 0346   NA 137 07/31/2020 1153   K 3.7 08/28/2020 0346   CL 103 08/28/2020 0346   CO2 21 (L) 08/28/2020 0346   GLUCOSE 149 (H) 08/28/2020 0346   BUN 13 08/28/2020 0346   BUN 11 07/31/2020 1153   CREATININE 1.23 (H) 08/28/2020 0346   CALCIUM 8.8 (L) 08/28/2020 0346   GFRNONAA 46 (L) 08/28/2020 0346   GFRAA >60 07/03/2019 1430     Intake/Output Summary (Last 24 hours) at 08/29/2020 0821 Last data filed at 08/29/2020 0806 Gross per 24 hour  Intake 480 ml  Output  --  Net 480 ml    HOSPITAL MEDICATIONS Scheduled Meds: . amLODipine  10 mg Oral Daily  . aspirin EC  81 mg Oral Daily  . atorvastatin  40 mg Oral Daily  . carvedilol  3.125 mg Oral BID WC  . ciprofloxacin  500 mg Oral BID  . clopidogrel  75 mg Oral Daily  . docusate sodium  100 mg Oral Daily  . pantoprazole  40 mg Oral Daily   Continuous Infusions: . sodium chloride    . sodium chloride 75 mL/hr at 08/27/20 1216  . magnesium sulfate bolus IVPB     PRN Meds:.sodium chloride, acetaminophen **OR** acetaminophen, albuterol, ALPRAZolam, alum & mag hydroxide-simeth, bisacodyl, guaiFENesin-dextromethorphan, hydrALAZINE, HYDROmorphone (DILAUDID) injection, labetalol, magnesium sulfate bolus IVPB, metoprolol tartrate, ondansetron, phenol, polyethylene glycol, potassium chloride, traMADol  Assessment and Plan: POD 2 left transcarotid artery revascularization secondary to symptomatic left ICA stenosis with retinal embolus.  She is hemodynamically stable and neurologically intact.  She is ready for discharge home. She will continue Plavix for 1 month and then discontinue secondary to easy bleeding and bruisability.  She will continue aspirin therapy indefinitely.  Continue atorvastatin. She will follow-up in 4 weeks with carotid duplex. Ducted to call EMS should she  develop signs or symptoms of stroke.  We discussed incisional care and to call office should she develop significant swelling of her incision. -DVT prophylaxis: SCDs   Risa Grill, PA-C [facility] [phone] 08/29/2020  8:21 AM

## 2020-08-29 NOTE — Progress Notes (Signed)
Pt being D/C education provided, VSS, telebox was returned.   Chrisandra Carota, RN 08/29/2020 1:19 PM

## 2020-08-29 NOTE — Plan of Care (Signed)
  Problem: Clinical Measurements: Goal: Will remain free from infection Outcome: Progressing Goal: Diagnostic test results will improve Outcome: Progressing   

## 2020-08-29 NOTE — Progress Notes (Signed)
PHARMACIST LIPID MONITORING  Donna Lawson is a 75 y.o. female admitted on 08/27/2020 with left retinal artery occlusion/left carotid stenosis, admitted for surgical intervetion on 5/1.  S/p left TCAR stent done on 5/11 .  Pharmacy has been consulted to optimize lipid-lowering therapy with the indication of secondary prevention for clinical ASCVD.  Recent Labs: Lipid Panel (last 6 months):   Lab Results  Component Value Date   CHOL 132 08/28/2020   TRIG 81 08/28/2020   HDL 60 08/28/2020   CHOLHDL 2.2 08/28/2020   VLDL 16 08/28/2020   LDLCALC 56 08/28/2020    Hepatic function panel (last 6 months):   Lab Results  Component Value Date   AST 15 08/26/2020   ALT 10 08/26/2020   ALKPHOS 98 08/26/2020   BILITOT 0.6 08/26/2020    SCr (since admission):   Serum creatinine: 1.23 mg/dL (H) 08/28/20 0346 Estimated creatinine clearance: 34.7 mL/min (A)  Current therapy and lipid therapy tolerance Current lipid-lowering therapy: Atorvastatin '40mg'$  daily  Previous lipid-lowering therapies (if applicable): Rosuvastatin Documented or reported allergies or intolerances to lipid-lowering therapies (if applicable):  Myalgias due to Rosuvastatin and Atorvastatin documented.   Assessment:   75 y.o female S/p L TCAR -currently on atorvastain 40 mg daily PTA/ resumed as inpatient.  This was recently started on 08/22/20.  LDL on 5/12 = 56  (6days ago LDL = 133)   -Intolerances noted to atorvastatin and rosuvastatin causes myalgias, however noted she  takes atorvastatin'40mg'$  PTA. EPIC review pt had been on atorvastatin since~2014 but stopped recently around 06/17/20. Recently she was admitted to [facility] on  5/6 for Left Retinal Artery Occlusion/Left Carotid Stenosis>at that time MD restarted atorvastatin 5/6 then discharged home 5/8 on Atorv '40mg'$ , with plan for surgical intervention on 5/11> now completed.  Considering just recently started back on high intensity statin, Atorvastatin '40mg'$  daily, I will not  make any adjustments to atorvastatin at this time.  On prior hosptial admission  08/22/20 MD noted patient would benefit from starting aPCSK9 inhibitoras an outpatient.  Plan:    1.Statin intensity (high intensity recommended for all patients regardless of the LDL):  No statin changes. The patient is already on a high intensity statin.   2.Add ezetimibe (if any one of the following):   Not indicated at this time.  3.Refer to lipid clinic:   No  4.Follow-up with:  Primary care provider - Gaylan Gerold, DO  5.Follow-up labs after discharge:  No changes in lipid therapy, repeat a lipid panel in one year.       Thank you for allowing pharmacy to be part of this patients care team.  Nicole Cella, [place] Pharmacist [phone] Please check AMION for all [place] phone numbers After 10:00 PM, call [place] [phone] 08/29/2020, 9:38 AM

## 2020-09-02 LAB — ALDOSTERONE + RENIN ACTIVITY W/ RATIO
ALDO / PRA Ratio: 6.5 (ref 0.0–30.0)
Aldosterone: 19.9 ng/dL (ref 0.0–30.0)
PRA LC/MS/MS: 3.046 ng/mL/hr (ref 0.167–5.380)

## 2020-09-13 ENCOUNTER — Other Ambulatory Visit: Payer: Self-pay | Admitting: Internal Medicine

## 2020-09-13 DIAGNOSIS — I10 Essential (primary) hypertension: Secondary | ICD-10-CM

## 2020-09-17 MED ORDER — CLOPIDOGREL BISULFATE 75 MG PO TABS: 75.0000 mg | ORAL_TABLET | Freq: Every day | ORAL | 0 refills | Status: DC

## 2020-09-17 MED ORDER — CARVEDILOL 3.125 MG PO TABS: 3.1250 mg | ORAL_TABLET | Freq: Two times a day (BID) | ORAL | 0 refills | Status: DC

## 2020-09-17 MED ORDER — AMLODIPINE BESYLATE 10 MG PO TABS
10.0000 mg | ORAL_TABLET | Freq: Every day | ORAL | 0 refills | Status: DC
Start: 2020-09-17 — End: 2020-09-24

## 2020-09-17 MED ORDER — ATORVASTATIN CALCIUM 40 MG PO TABS: 40.0000 mg | ORAL_TABLET | Freq: Every day | ORAL | 0 refills | Status: DC

## 2020-09-19 DIAGNOSIS — H348322 Tributary (branch) retinal vein occlusion, left eye, stable: Secondary | ICD-10-CM | POA: Diagnosis not present

## 2020-09-19 DIAGNOSIS — H34232 Retinal artery branch occlusion, left eye: Secondary | ICD-10-CM | POA: Diagnosis not present

## 2020-09-20 ENCOUNTER — Other Ambulatory Visit: Payer: Self-pay | Admitting: Internal Medicine

## 2020-09-20 DIAGNOSIS — E876 Hypokalemia: Secondary | ICD-10-CM

## 2020-09-20 DIAGNOSIS — I10 Essential (primary) hypertension: Secondary | ICD-10-CM

## 2020-09-25 DIAGNOSIS — I700 Atherosclerosis of aorta: Secondary | ICD-10-CM | POA: Insufficient documentation

## 2020-09-25 DIAGNOSIS — G4700 Insomnia, unspecified: Secondary | ICD-10-CM | POA: Insufficient documentation

## 2020-09-25 DIAGNOSIS — E041 Nontoxic single thyroid nodule: Secondary | ICD-10-CM | POA: Insufficient documentation

## 2020-09-25 DIAGNOSIS — R7303 Prediabetes: Secondary | ICD-10-CM

## 2020-09-25 DIAGNOSIS — M5430 Sciatica, unspecified side: Secondary | ICD-10-CM | POA: Insufficient documentation

## 2020-09-25 HISTORY — DX: Prediabetes: R73.03

## 2020-09-26 ENCOUNTER — Ambulatory Visit (INDEPENDENT_AMBULATORY_CARE_PROVIDER_SITE_OTHER): Payer: Medicare HMO | Admitting: Physician Assistant

## 2020-09-26 ENCOUNTER — Other Ambulatory Visit: Payer: Self-pay

## 2020-09-26 ENCOUNTER — Ambulatory Visit (HOSPITAL_COMMUNITY)
Admission: RE | Admit: 2020-09-26 | Discharge: 2020-09-26 | Disposition: A | Discharge status: Home / Self Care | Payer: Medicare HMO | Source: Ambulatory Visit | Attending: Vascular Surgery | Admitting: Vascular Surgery

## 2020-09-26 ENCOUNTER — Other Ambulatory Visit: Payer: Self-pay | Admitting: *Deleted

## 2020-09-26 ENCOUNTER — Encounter (HOSPITAL_COMMUNITY): Payer: Medicare HMO

## 2020-09-26 VITALS — BP 157/88 | HR 78 | Temp 97.8°F | Resp 20 | Ht 62.0 in | Wt 142.2 lb

## 2020-09-26 DIAGNOSIS — I6521 Occlusion and stenosis of right carotid artery: Secondary | ICD-10-CM | POA: Diagnosis not present

## 2020-09-26 DIAGNOSIS — H349 Unspecified retinal vascular occlusion: Secondary | ICD-10-CM

## 2020-09-26 DIAGNOSIS — Z95828 Presence of other vascular implants and grafts: Secondary | ICD-10-CM

## 2020-09-26 NOTE — Progress Notes (Signed)
POST OPERATIVE OFFICE NOTE    CC:  F/u for surgery  HPI:  This is a 75 y.o. female who is s/p Left Transcarotid artery revascularization by Dr. Donzetta Matters on 08/27/20. This was performed for symptomatic left ICA stenosis with retinal embolus. She did well post operatively and was discharge POD#2. She explains that she still has partial peripheral vision loss in left eye. No new symptoms since she was discharged. Says overall she is feeling well. Some itching and pulling/ sticking sensations in incision line. She continues to have issues with significant bruising and upset stomach with the Aspirin and Plavix.  She is on Aspirin, Plavix and Liptor.  She takes Carvedilol and Amlodipine for Hypertension management  Allergies  Allergen Reactions   Atorvastatin Other (See Comments)    Other reaction(s): myalgias   Crestor [Rosuvastatin Calcium] Other (See Comments)    Other reaction(s): myalgias   Lisinopril Swelling    Tongue swelling   Cefuroxime Axetil Other (See Comments)    Other reaction(s): not feel well   Oxycodone-Acetaminophen Other (See Comments)    Other reaction(s): feel weird    Current Outpatient Medications  Medication Sig Dispense Refill   ALPRAZolam (XANAX) 0.5 MG tablet Take 1 tablet (0.5 mg total) at bedtime as needed by mouth for anxiety or sleep. 30 tablet 1   amLODipine (NORVASC) 10 MG tablet TAKE 1 TABLET BY MOUTH EVERY DAY 30 tablet 0   aspirin 81 MG EC tablet TAKE 1 TABLET (81 MG TOTAL) BY MOUTH DAILY. SWALLOW WHOLE. 30 tablet 0   atorvastatin (LIPITOR) 40 MG tablet TAKE 1 TABLET BY MOUTH EVERY DAY 30 tablet 0   carvedilol (COREG) 3.125 MG tablet TAKE 1 TABLET (3.125 MG TOTAL) BY MOUTH 2 (TWO) TIMES DAILY WITH A MEAL. 60 tablet 0   clopidogrel (PLAVIX) 75 MG tablet Take 1 tablet (75 mg total) by mouth daily. 39 tablet 0   potassium chloride (KLOR-CON) 20 MEQ packet TAKE 1 PACKET BY MOUTH EVERY DAY AS DIRECTED 30 packet 0   No current facility-administered medications  for this visit.     ROS:  See HPI  Physical Exam:  Vitals:   09/26/20 0929 09/26/20 0932  BP: (!) 169/91 (!) 157/88  Pulse: 78   Resp: 20   Temp: 97.8 F (36.6 C)   TempSrc: Temporal   SpO2: 98%   Weight: 142 lb 3.2 oz (64.5 kg)   Height: '5\' 2"'$  (1.575 m)    General: well appearing, not in any distress Incision:  left neck incisions is clean, dry and intact. Healing well. Dermabond still present. Some surrounding fullness but soft and non tender. No erythema or ecchymosis Extremities:  moving all extremities without deficits Neuro: CN intact. Speech coherent. Tongue midline. Face symmetric. Sensation is intact and equal bilaterally   Non Invasive Vascular Lab: VAS US Carotid Duplex: Summary:  Right Carotid: Velocities in the right ICA are consistent with a 1-39% stenosis. Enlarged thyroid causing displacement of the proximal CCA.   Left Carotid: There is no evidence of stenosis in the left distal CCA to ICA stent.   Vertebrals:  Bilateral vertebral arteries demonstrate antegrade flow.  Subclavians: Normal flow hemodynamics were seen in bilateral subclavian arteries.   Assessment/Plan:  This is a 75 y.o. female who is s/p left TCAR by Dr. Donzetta Matters 08/27/20. She is doing well post op. She has had  no new neurological symptoms. She still has visual field loss in left eye. Duplex shows patent L ICA stent with no recurrent  stenosis. Right ICA with 1-39% stenosis. Normal vertebral and subclavian artery hemodynamics. Of note she has a thyroid nodule. Patient has known about this and has routine follow up regarding this. - She can discontinue Plavix at this time. She has had issues with significant bruising and abdominal pain associated with it so it was discussed with her at discharge a recommended shorter course. She has taken it for 4 weeks now - She will continue her Aspirin 81 mg and Lipitor - She will return for follow up in 6 months with carotid duplex   Karoline Caldwell,  PA-C [facility] [phone]  On call MD: Dr. Stanford Breed

## 2020-09-29 ENCOUNTER — Other Ambulatory Visit: Payer: Self-pay | Admitting: Internal Medicine

## 2020-10-08 ENCOUNTER — Encounter: Payer: Self-pay | Admitting: *Deleted

## 2020-10-08 NOTE — Progress Notes (Unsigned)

## 2020-10-13 NOTE — Progress Notes (Unsigned)
Things That May Be Affecting Your Health:  Alcohol  Hearing loss x Pain    Depression  Home Safety  Sexual Health   Diabetes  Lack of physical activity  Stress   Difficulty with daily activities  Loneliness x Tiredness   Drug use  Medicines  Tobacco use   Falls  Motor Vehicle Safety  Weight   Food choices  Oral Health  Other    YOUR PERSONALIZED HEALTH PLAN : 1. Schedule your next subsequent Medicare Wellness visit in one year 2. Attend all of your regular appointments to address your medical issues 3. Complete the preventative screenings and services   Annual Wellness Visit   Medicare Covered Preventative Screenings and [place] Men and Women Who How Often Need? Date of Last Service Action  Abdominal Aortic Aneurysm Adults with AAA risk factors Once      Alcohol Misuse and Counseling All Adults Screening once a year if no alcohol misuse. Counseling up to 4 face to face sessions.     Bone Density Measurement  Adults at risk for osteoporosis Once every 2 yrs x     Lipid Panel Z13.6 All adults without CV disease Once every 5 yrs       Colorectal Cancer  Stool sample or Colonoscopy All adults 43 and older  Once every year Every 10 years x       Depression All Adults Once a year  Today   Diabetes Screening Blood glucose, post glucose load, or GTT Z13.1 All adults at risk Pre-diabetics Once per year Twice per year      Diabetes  Self-Management Training All adults Diabetics 10 hrs first year; 2 hours subsequent years. Requires Copay     Glaucoma Diabetics Family history of glaucoma African Americans 53 yrs + Hispanic Americans 51 yrs + Annually - requires coppay      Hepatitis C Z72.89 or F19.20 High Risk for HCV Born between 1945 and 1965 Annually Once x     HIV Z11.4 All adults based on risk Annually btw ages 43 & 43 regardless of risk Annually > 65 yrs if at increased risk      Lung Cancer Screening Asymptomatic adults aged 72-77 with 30  pack yr history and current smoker OR quit within the last 15 yrs Annually Must have counseling and shared decision making documentation before first screen      Medical Nutrition Therapy Adults with  Diabetes Renal disease Kidney transplant within past 3 yrs 3 hours first year; 2 hours subsequent years     Obesity and Counseling All adults Screening once a year Counseling if BMI 30 or higher  Today   Tobacco Use Counseling Adults who use tobacco  Up to 8 visits in one year     Vaccines Z23 Hepatitis B Influenza  Pneumonia  Adults  Once Once every flu season Two different vaccines separated by one year     Next Annual Wellness Visit People with Medicare Every year  Today     Services & Screenings Women Who How Often Need  Date of Last Service Action  Mammogram  Z12.31 Women over 26 One baseline ages 45-39. Annually ager 40 yrs+      Pap tests All women Annually if high risk. Every 2 yrs for normal risk women      Screening for cervical cancer with  Pap (Z01.419 nl or Z01.411abnl) & HPV Z11.51 Women aged 22 to 73 Once every 5 yrs  Screening pelvic and breast exams All women Annually if high risk. Every 2 yrs for normal risk women     Sexually Transmitted Diseases Chlamydia Gonorrhea Syphilis All at risk adults Annually for non pregnant females at increased risk         [place] Men Who How Ofter Need  Date of Last Service Action  Prostate Cancer - DRE & PSA Men over 50 Annually.  DRE might require a copay.        Sexually Transmitted Diseases Syphilis All at risk adults Annually for men at increased risk      Health Maintenance List Health Maintenance  Topic Date Due   Hepatitis C Screening  Never done   Zoster Vaccines- Shingrix (1 of 2) Never done   DEXA SCAN  Never done   COVID-19 Vaccine (3 - Booster for Pfizer series) 11/11/2019   INFLUENZA VACCINE  11/17/2020   PNA vac Low Risk Adult (2 of 2 - PPSV23) 07/31/2021   TETANUS/TDAP   08/01/2030   HPV VACCINES  Aged Out

## 2020-10-21 ENCOUNTER — Encounter: Payer: Self-pay | Admitting: *Deleted

## 2020-10-30 ENCOUNTER — Other Ambulatory Visit: Payer: Self-pay

## 2020-10-30 MED ORDER — CARVEDILOL 3.125 MG PO TABS: 3.1250 mg | ORAL_TABLET | Freq: Two times a day (BID) | ORAL | 0 refills | Status: DC

## 2020-10-30 MED ORDER — CARVEDILOL 3.125 MG PO TABS: 3.1250 mg | ORAL_TABLET | Freq: Two times a day (BID) | ORAL | 3 refills | Status: DC

## 2020-10-30 NOTE — Telephone Encounter (Signed)
Pt is requesting her   carvedilol (COREG) 3.125 MG tablet sent to  [place], [address] Phone:  [phone]  Fax:  [phone]      ( Pt stated that she only has three days of medication left )

## 2020-10-30 NOTE — Addendum Note (Signed)
Addended by: Riesa Pope on: 10/30/2020 10:45 AM   Modules accepted: Orders

## 2020-11-04 ENCOUNTER — Inpatient Hospital Stay: Payer: Self-pay | Admitting: Adult Health

## 2020-11-27 ENCOUNTER — Ambulatory Visit (INDEPENDENT_AMBULATORY_CARE_PROVIDER_SITE_OTHER): Payer: Medicare HMO | Admitting: Student

## 2020-11-27 ENCOUNTER — Encounter: Payer: Self-pay | Admitting: Student

## 2020-11-27 DIAGNOSIS — Z760 Encounter for issue of repeat prescription: Secondary | ICD-10-CM | POA: Diagnosis not present

## 2020-11-27 DIAGNOSIS — F419 Anxiety disorder, unspecified: Secondary | ICD-10-CM

## 2020-11-27 DIAGNOSIS — I10 Essential (primary) hypertension: Secondary | ICD-10-CM

## 2020-11-27 DIAGNOSIS — E876 Hypokalemia: Secondary | ICD-10-CM

## 2020-11-27 DIAGNOSIS — I6522 Occlusion and stenosis of left carotid artery: Secondary | ICD-10-CM

## 2020-11-27 DIAGNOSIS — R69 Illness, unspecified: Secondary | ICD-10-CM | POA: Diagnosis not present

## 2020-11-27 NOTE — Assessment & Plan Note (Addendum)
Patient with history of hypokalemia dated back to 2014.  She was on HCTZ since 2013 and was stopped in 2016?  (Patient could not recall).  She also had symptomatic cholelithiasis in April 2022 and had a cholecystectomy done in May.  Said that during those few months, she could not eat or drink.   Last BMP showed normalization of potassium 0.7.  Assessment and plan This is a chronic issue.  Her HCTZ can play a role in this but she was still hypokalemic when she was not on HCTZ.  Her poor p.o. intake from cholelithiasis also affecting her potassium this year.  Aldo/renin ratio was checked during her last hospitalization and came back normal.  There was no acidosis to suggest RTA.  Low suspicion of Bartter/Lidleman without any alkalosis.  Patient reports eating a balanced diet with plenty of vegetables she also denies nausea, vomiting or diarrhea.  -Will check magnesium at next visit -Will also check urine osmolarity, serum osmolality, urine potassium and serum potassium calculate potassium tubular excretion

## 2020-11-27 NOTE — Progress Notes (Signed)
  [facility] Internal Medicine Residency Telephone Encounter Continuity Care Appointment  HPI:  This telephone encounter was created for [name] on 11/27/2020 for the following purpose/cc medications refill.   Past Medical History:  Past Medical History:  Diagnosis Date   Anxiety    Arthritis    Diverticula, colon 1999   Heart murmur    History of blood transfusion    Hyperlipidemia    Hypertension    Osteoarthritis of knee    right   PONV (postoperative nausea and vomiting)      ROS:  Per HPI   Assessment / Plan / Recommendations:  Please see A&P under problem oriented charting for assessment of the patient's acute and chronic medical conditions.  As always, pt is advised that if symptoms worsen or new symptoms arise, they should go to an urgent care facility or to to ER for further evaluation.   Consent and Medical Decision Making:  Patient discussed with Dr. Philipp Ovens This is a telephone encounter between Donna Lawson and Gaylan Gerold on 11/27/2020 for medication refill. The visit was conducted with the patient located at home and Gaylan Gerold at [facility]. The patient's identity was confirmed using their DOB and current address. The patient has consented to being evaluated through a telephone encounter and understands the associated risks (an examination cannot be done and the patient may need to come in for an appointment) / benefits (allows the patient to remain at home, decreasing exposure to coronavirus). I personally spent 16 minutes on medical discussion.

## 2020-11-27 NOTE — Assessment & Plan Note (Addendum)
Patient with history of hypertension.  Currently taking amlodipine 10 mg and Coreg 3.135 mg BID.  Pressure was not controlled during last specialty visit.  Patient said that she has whitecoat hypertension.  Her daughter measured her blood pressure at home last month and it was 134/80.  States that she never ran out of medications and taking the medications instructed.  -Patient cannot come in to the office because her car broke down.  She will come in when they can obtain a new car. -Advised patient to start checking her blood pressure a few times a week for the next 2 weeks and keep a log.  We will do another telehealth visit in 2 weeks for blood pressure recheck.  If high, we will adjust her regimen. -Continue amlodipine 10 mg and Coreg 3.125 mg BID.  Refill sent

## 2020-11-27 NOTE — Assessment & Plan Note (Signed)
Patient with history of left retinal artery and carotid artery stenosis.  She underwent revascularization procedure in June 2022.  Current regimen include atorvastatin 40 mg and aspirin 81 mg.  Last LDL was 56.  She last saw her specialist in July and was told to stop Plavix.  States that her left eye vision is not improving significantly.  -Continue aspirin 81 mg -Continue atorvastatin 40 mg -Follow-up with vascular surgeon

## 2020-11-27 NOTE — Assessment & Plan Note (Signed)
Reports that she only had 5 tablets of Xanax left.  She mainly use them at night for sleep.  I discussed with her the risk and benefit of using benzodiazepines in the elderly.  I explained to her the side effects and complication including sedation, fall or confusion.  We will not prescribe any further Xanax for her.  Patient verbalizes understanding.

## 2020-12-01 ENCOUNTER — Encounter: Payer: Medicare HMO | Admitting: Student

## 2020-12-02 NOTE — Progress Notes (Signed)
Internal Medicine Clinic Attending  Case discussed with Dr. Nguyen  At the time of the visit.  We reviewed the resident's history and exam and pertinent patient test results.  I agree with the assessment, diagnosis, and plan of care documented in the resident's note. 

## 2020-12-08 ENCOUNTER — Inpatient Hospital Stay: Payer: Self-pay | Admitting: Adult Health

## 2020-12-08 ENCOUNTER — Encounter: Payer: Medicare HMO | Admitting: Student

## 2021-01-01 ENCOUNTER — Other Ambulatory Visit: Payer: Self-pay | Admitting: Student

## 2021-01-15 ENCOUNTER — Ambulatory Visit: Payer: Medicare HMO | Admitting: Adult Health

## 2021-01-15 ENCOUNTER — Other Ambulatory Visit: Payer: Self-pay

## 2021-01-15 ENCOUNTER — Encounter: Payer: Self-pay | Admitting: Adult Health

## 2021-01-15 VITALS — BP 153/87 | HR 73 | Ht 62.0 in | Wt 140.0 lb

## 2021-01-15 DIAGNOSIS — I10 Essential (primary) hypertension: Secondary | ICD-10-CM

## 2021-01-15 DIAGNOSIS — E785 Hyperlipidemia, unspecified: Secondary | ICD-10-CM | POA: Diagnosis not present

## 2021-01-15 DIAGNOSIS — H34232 Retinal artery branch occlusion, left eye: Secondary | ICD-10-CM | POA: Diagnosis not present

## 2021-01-15 DIAGNOSIS — I6522 Occlusion and stenosis of left carotid artery: Secondary | ICD-10-CM

## 2021-01-15 NOTE — Patient Instructions (Signed)
Continue aspirin 81 mg daily  and atorvastatin '40mg'$  daily  for secondary stroke prevention  Continue to follow up with PCP regarding cholesterol and blood pressure management  Maintain strict control of hypertension with blood pressure goal below 130/90 and cholesterol with LDL cholesterol (bad cholesterol) goal below 70 mg/dL.   Ensure follow up with vascular surgery for repeat carotid ultrasound as advised    Followup in the future with me in 6 months or call earlier if needed       Thank you for coming to see [place] at [facility]. I hope we have been able to provide you high quality care today.  You may receive a patient satisfaction survey over the next few weeks. We would appreciate your feedback and comments so that we may continue to improve ourselves and the health of our patients.

## 2021-01-15 NOTE — Progress Notes (Signed)
[facility] [address] [place]. [place] [address] [phone]       HOSPITAL FOLLOW UP NOTE  [name] Date of Birth:  April 12, 1946 Medical Record Number:  [id number]   Reason for Referral:  hospital stroke follow up    SUBJECTIVE:   CHIEF COMPLAINT:  Chief Complaint  Patient presents with   Follow-up    RM 3 alone Pt is well, lost partial vision in L eye but overall stable.     HPI:   Donna Lawson is a 75 year old woman with PMH significant for HTN, HLD with statin intolerances, anxiety, and  heart murmur who presented on 08/21/2020 with sudden onset of left eye vision loss with ophthalmological exam concerning for left eye optic disc edema.  Personally reviewed hospitalization pertinent progress notes, lab work imaging.  Evaluated by Dr. Erlinda Hong for left visual loss possibly in setting of BRAO secondary to left carotid stenosis.  CTA head/neck eccentric calcified plaque of left carotid bulb with stenosis up to 70%.  Evaluated by [facility] with plans on undergoing left TCAR stent.  MRI brain negative for acute stroke.  2D echo EF 60%.  LDL 133 -initiated atorvastatin 40 mg daily.  A1c 5.2. GCA ruled out during admission with ESR 28 and CRP 0.7.  Residual left eye vision with peripheral visual loss (nasal more than temporal).  Recommended DAPT with duration determined by VVS.  Discharged home on 5/8 without therapy needs.  Today, 01/15/2021, Donna Lawson is being seen for hospital follow up.  She underwent left TCAR with stent on 5/11 by Dr. Donzetta Matters without complication.  Reports continued left eye visual impairment with good days and bad days. Outer portion of left eye affected. Feels like she is looking through a telescope. She has returned back to all prior activities without difficulties.  She does not drive nor has ever drove.  She relies on family for transportation.  She does c/o jaw achiness after chewing and posterior neck tightness since hospitalization - she  does endorse increase stress since her stroke. No new stroke/TIA symptoms. Completed DAPT on aspirin alone and atorvastatin $RemoveBeforeDE'40mg'VaSZYbyPxIlaLcq$  daily tolerating without side effects (difficulty tolerating DAPT with stomach issues). Blood pressure today 153/87. Monitors at home and around the same level as today.  No further concerns at this time     [place] 08/21/2020 IMPRESSION: 1. No CT evidence for acute intracranial abnormality. Atrophy and mild chronic small vessel ischemic change of the white matter 2. Sinus disease  CTA HEAD/NECK 08/21/2020 IMPRESSION: 1. Negative CTA for large vessel occlusion. 2. Atheromatous plaque at the left carotid bulb with associated short-segment stenosis of up to 70% by NASCET criteria. 3. Mild for age atheromatous change elsewhere about the major arterial vasculature of the head and neck. No other hemodynamically significant or correctable stenosis. 4. Diffuse tortuosity of the major arterial vasculature of the head and neck, suggesting chronic underlying hypertension.  MR BRAIN 08/21/2020 IMPRESSION: 1. No definite acute intracranial abnormality. Vague diffusion abnormality involving the left ventral pons on axial DWI image 66 favored to be artifactual in nature. If there is high clinical suspicion for a possible ischemic infarct at this location, a repeat study with thin section imaging through the brainstem would be suggested for further evaluation. 2. Underlying age-related cerebral atrophy with moderate chronic microvascular ischemic disease. 3. Left sphenoid sinus disease.       ROS:   14 system review of systems performed and negative with exception of those listed in  HPI  PMH:  Past Medical History:  Diagnosis Date   Anxiety    Arthritis    Diverticula, colon 1999   Heart murmur    History of blood transfusion    Hyperlipidemia    Hypertension    Osteoarthritis of knee    right   PONV (postoperative nausea and vomiting)      PSH:  Past Surgical History:  Procedure Laterality Date   ABDOMINAL SURGERY     CHOLECYSTECTOMY N/A 07/01/2020   Procedure: LAPAROSCOPIC CHOLECYSTECTOMY;  Surgeon: Griselda Miner, MD;  Location: [facility] OR;  Service: General;  Laterality: N/A;   COLONOSCOPY     GALLBLADDER SURGERY  07/2020   INTRAOPERATIVE CHOLANGIOGRAM N/A 07/01/2020   Procedure: INTRAOPERATIVE CHOLANGIOGRAM;  Surgeon: Griselda Miner, MD;  Location[facility] OR;  Service: General;  Laterality: N/A;   TRANSCAROTID ARTERY REVASCULARIZATION  Left 08/27/2020   Procedure: LEFT TRANSCAROTID ARTERY REVASCULARIZATION;  Surgeon: Maeola Harman, MD;  Location[facility] OR;  Service: Vascular;  Laterality: Left;   ULTRASOUND GUIDANCE FOR VASCULAR ACCESS Right 08/27/2020   Procedure: ULTRASOUND GUIDANCE FOR VASCULAR ACCESS;  Surgeon: Maeola Harman, MD;  Location: [facility] OR;  Service: Vascular;  Laterality: Right;    Social History:  Social History   Socioeconomic History   Marital status: Widowed    Spouse name: Not on file   Number of children: Not on file   Years of education: Not on file   Highest education level: Not on file  Occupational History   Not on file  Tobacco Use   Smoking status: Former   Smokeless tobacco: Never  Vaping Use   Vaping Use: Never used  Substance and Sexual Activity   Alcohol use: No    Alcohol/week: 0.0 standard drinks   Drug use: No   Sexual activity: Not on file  Other Topics Concern   Not on file  Social History Narrative   Widowed, lives alone with 2 dogs   Social Determinants of Health   Financial Resource Strain: Not on file  Food Insecurity: Not on file  Transportation Needs: Not on file  Physical Activity: Not on file  Stress: Not on file  Social Connections: Not on file  Intimate Partner Violence: Not on file    Family History:  Family History  Problem Relation Age of Onset   Arthritis Mother    Arthritis Father    Hypertension Other     Medications:    Current Outpatient Medications on File Prior to Visit  Medication Sig Dispense Refill   ALPRAZolam (XANAX) 0.5 MG tablet Take 1 tablet (0.5 mg total) at bedtime as needed by mouth for anxiety or sleep. 30 tablet 1   amLODipine (NORVASC) 10 MG tablet TAKE 1 TABLET BY MOUTH EVERY DAY 30 tablet 0   aspirin 81 MG EC tablet TAKE 1 TABLET (81 MG TOTAL) BY MOUTH DAILY. SWALLOW WHOLE. 30 tablet 0   atorvastatin (LIPITOR) 40 MG tablet Take 1 tablet by mouth once daily 90 tablet 2   carvedilol (COREG) 3.125 MG tablet Take 1 tablet (3.125 mg total) by mouth 2 (two) times daily with a meal. 90 tablet 3   [DISCONTINUED] lisinopril-hydrochlorothiazide (PRINZIDE,ZESTORETIC) 20-25 MG per tablet Take 1 tablet by mouth 2 (two) times daily. Take 1 by mouth daily     No current facility-administered medications on file prior to visit.    Allergies:   Allergies  Allergen Reactions   Atorvastatin Other (See Comments)    Other reaction(s): myalgias  Crestor [Rosuvastatin Calcium] Other (See Comments)    Other reaction(s): myalgias   Lisinopril Swelling    Tongue swelling   Cefuroxime Axetil Other (See Comments)    Other reaction(s): not feel well   Oxycodone-Acetaminophen Other (See Comments)    Other reaction(s): feel weird      OBJECTIVE:  Physical Exam  Vitals:   01/15/21 1304  BP: (!) 153/87  Pulse: 73  Weight: 140 lb (63.5 kg)  Height: $Remove'5\' 2"'eiitxeW$  (1.575 m)   Body mass index is 25.61 kg/m. No results found.  General: well developed, well nourished, very pleasant elderly female, seated, in no evident distress Head: head normocephalic and atraumatic.   Neck: supple with no carotid or supraclavicular bruits Cardiovascular: regular rate and rhythm, no murmurs Musculoskeletal: no deformity Skin:  no rash/petichiae Vascular:  Normal pulses all extremities   Neurologic Exam Mental Status: Awake and fully alert.  Fluent speech and language.  Oriented to place and time. Recent and remote  memory intact. Attention span, concentration and fund of knowledge appropriate. Mood and affect appropriate.  Cranial Nerves: Fundoscopic exam reveals sharp disc margins. Pupils equal, briskly reactive to light. Extraocular movements full without nystagmus. Visual fields OD full to confrontation OS loss of peripheral vision, central vision intact. Hearing intact. Facial sensation intact. Face, tongue, palate moves normally and symmetrically.  Motor: Normal bulk and tone. Normal strength in all tested extremity muscles Sensory.: intact to touch , pinprick , position and vibratory sensation.  Coordination: Rapid alternating movements normal in all extremities. Finger-to-nose and heel-to-shin performed accurately bilaterally. Gait and Station: Arises from chair without difficulty. Stance is normal. Gait demonstrates normal stride length and balance without use of assistive device. Tandem walk and heel toe with mild difficulty.  Reflexes: 1+ and symmetric. Toes downgoing.     NIHSS  1 Modified Rankin  1      ASSESSMENT: Donna Lawson is a 75 y.o. year old female with BRAO in setting of left carotid stenosis on 08/21/2020. Vascular risk factors include HTN, HLD, advanced age, former tobacco use and left carotid stenosis.      PLAN:  OS BRAO :  Residual deficit: OS visual impairment - stable with some improvement per patient.  Routinely followed by ophthalmology.   Continue aspirin 81 mg daily  and atorvastatin 40 mg daily for secondary stroke prevention.   Discussed secondary stroke prevention measures and importance of close PCP follow up for aggressive stroke risk factor management. I have gone over the pathophysiology of stroke, warning signs and symptoms, risk factors and their management in some detail with instructions to go to the closest emergency room for symptoms of concern. Left carotid stenosis: s/p TCAR 08/27/2020 by Dr. Donzetta Matters. Plans on f/u around 03/2021 for surveillance  monitoring HTN: BP goal <130/90.  Stable on current regimen per PCP HLD: LDL goal <70. Recent LDL 56 down from 133.  Continue atorvastatin 40 mg daily per PCP Neck tightness, jaw pain: Persistent since hospitalization.  Likely in setting of increased stressors.  Advised to follow-up with dentist regarding possible TMJ and/or grinding of teeth and to avoid clenching of her jaw. Discussed neck stretching exercises and importance of stress relaxation measures     Follow up in 6 months or call earlier if needed   CC:  [facility] provider: Dr. Leonie Man PCP: Gaylan Gerold, DO    I spent 56 minutes of face-to-face and non-face-to-face time with patient.  This included previsit chart review including review of recent hospitalization, lab review, study  review, electronic health record documentation, patient education regarding recent stroke including etiology, secondary stroke prevention measures and importance of managing stroke risk factors, residual deficits and typical recovery time and answered all other questions to patient satisfaction  Frann Rider, [facility]  [facility] [address] [place], [place] [address]  Phone [phone] Fax [phone] Note: This document was prepared with digital dictation and possible smart phrase technology. Any transcriptional errors that result from this process are unintentional.

## 2021-01-18 NOTE — Progress Notes (Signed)
I agree with the above plan 

## 2021-02-19 ENCOUNTER — Other Ambulatory Visit: Payer: Self-pay

## 2021-02-19 DIAGNOSIS — I10 Essential (primary) hypertension: Secondary | ICD-10-CM

## 2021-02-20 MED ORDER — AMLODIPINE BESYLATE 10 MG PO TABS: 10.0000 mg | ORAL_TABLET | Freq: Every day | ORAL | 2 refills | Status: DC

## 2021-02-21 IMAGING — US US THYROID
1 series · 13 of 25 positions shown · non-contrast
Comparison: CT 07/03/2019

CLINICAL DATA: Right thyroid nodule noted on CT chest

EXAM:
THYROID ULTRASOUND
TECHNIQUE: Ultrasound examination of the thyroid gland and adjacent soft
tissues was performed.

[Series 1: us thyroid · 0.07mm/px · 13 of 40 slices shown]
[im 1/40]
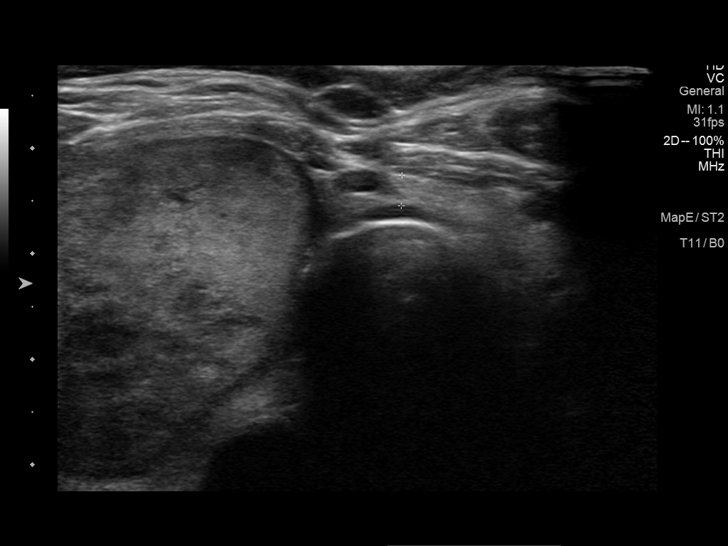
[im 4/40]
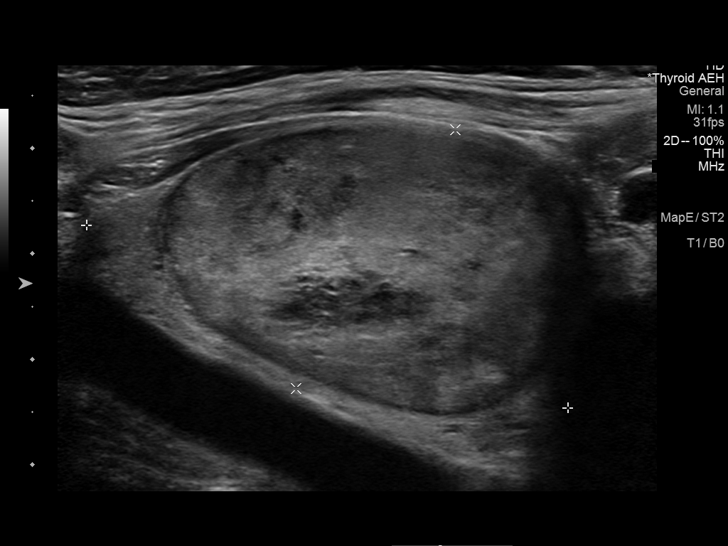
[im 7/40]
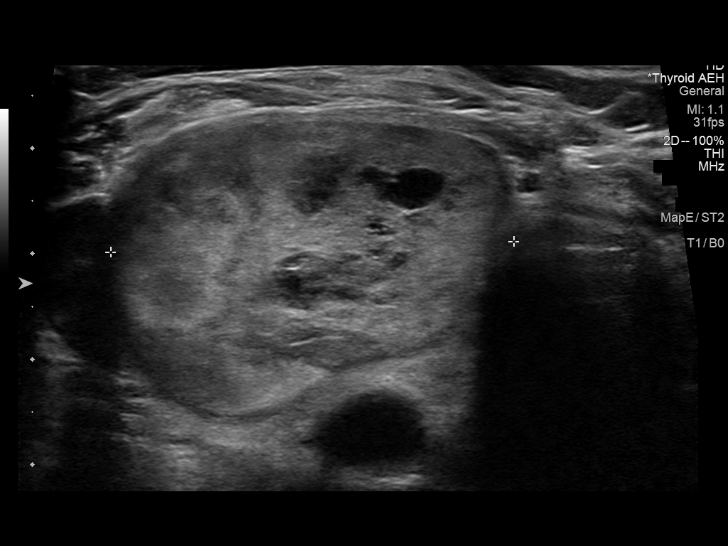
[im 10/40]
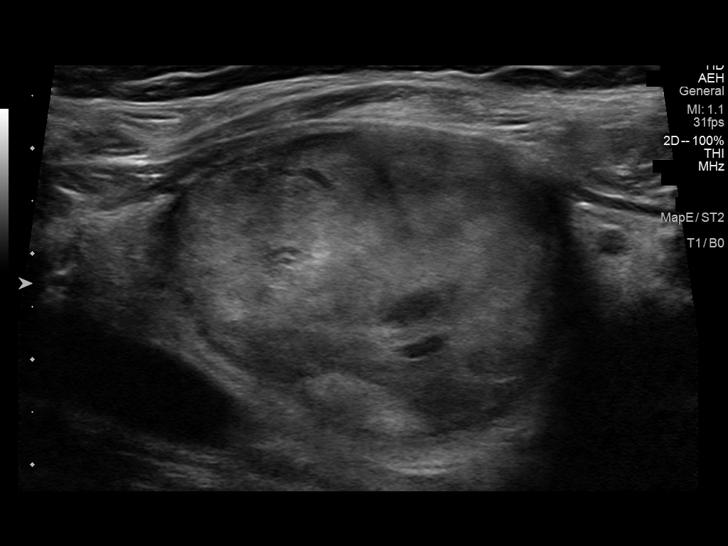
[im 14/40]
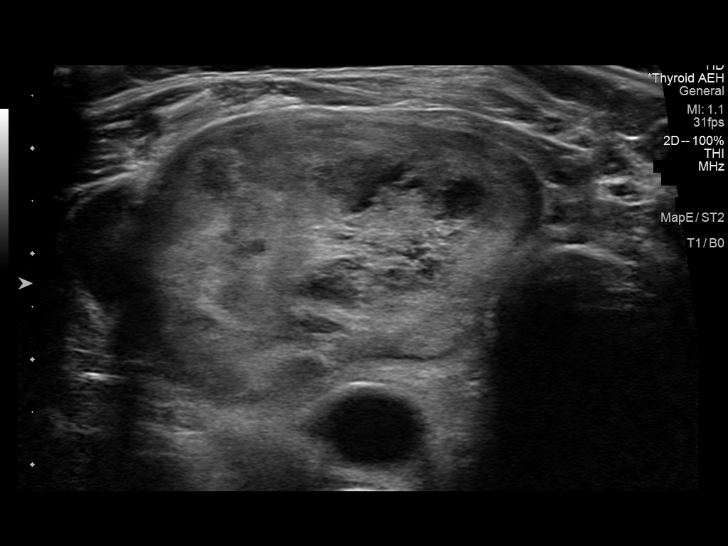
[im 17/40]
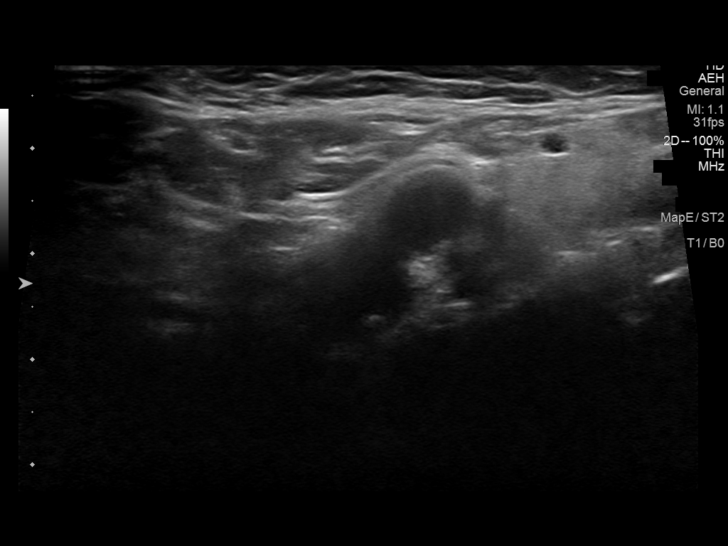
[im 20/40]
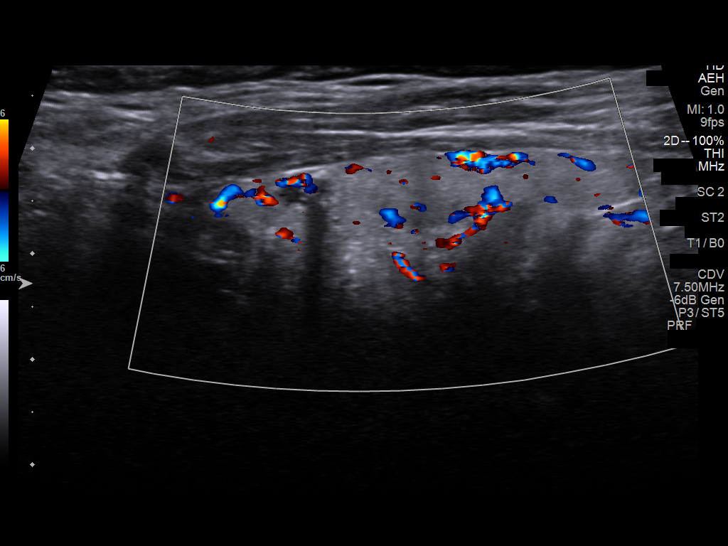
[im 23/40]
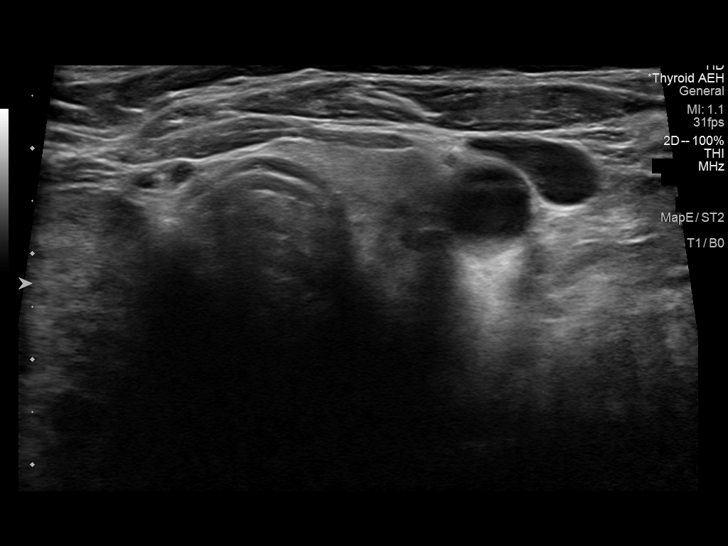
[im 27/40]
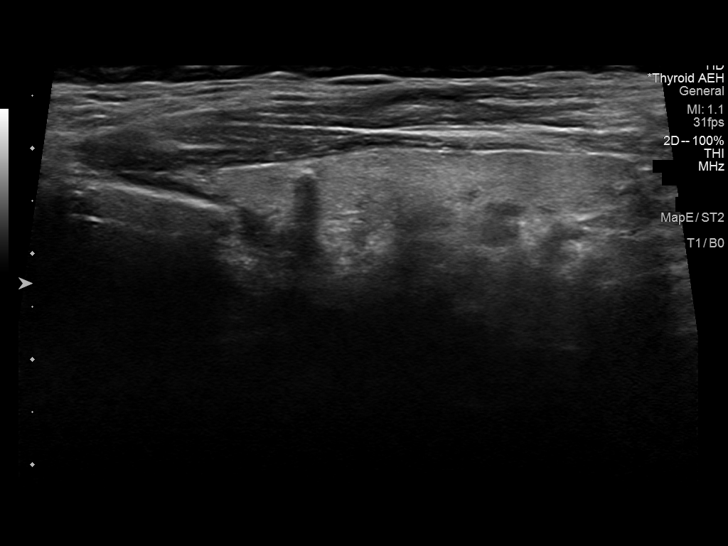
[im 30/40]
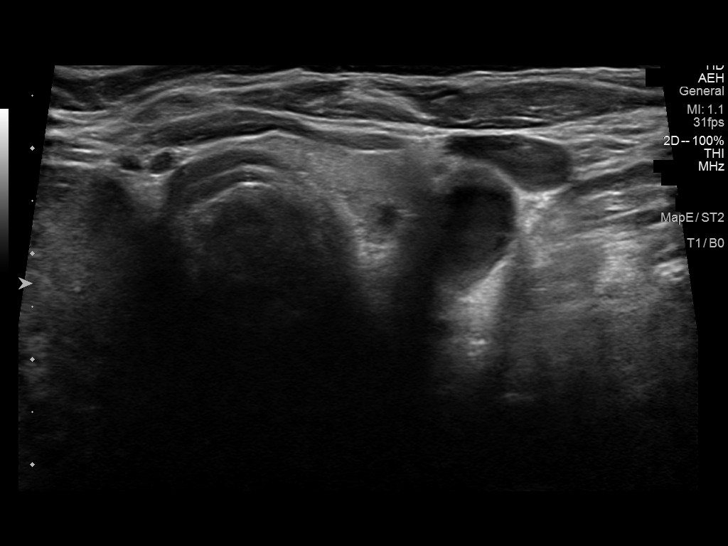
[im 33/40]
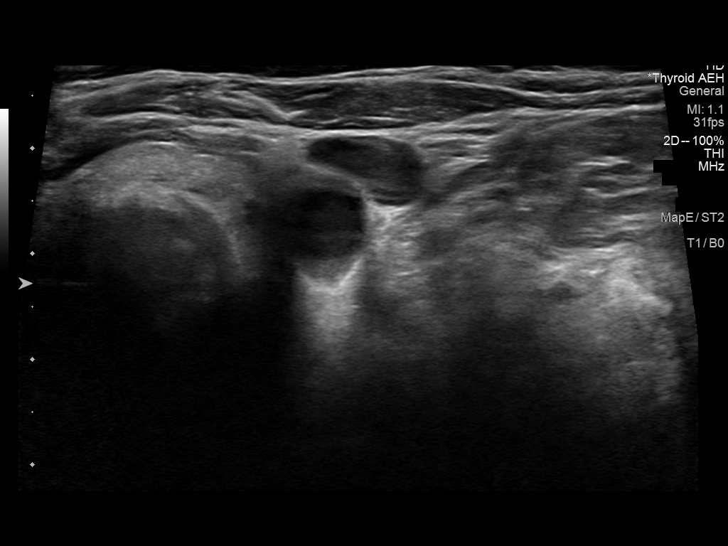
[im 36/40]
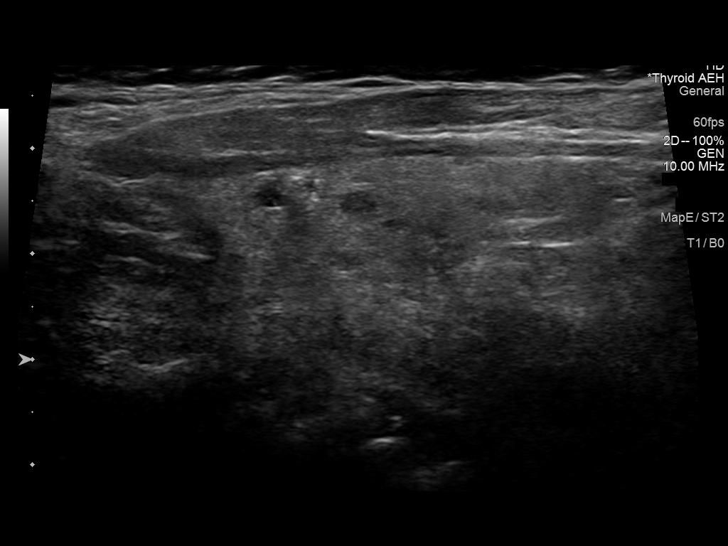
[im 40/40]
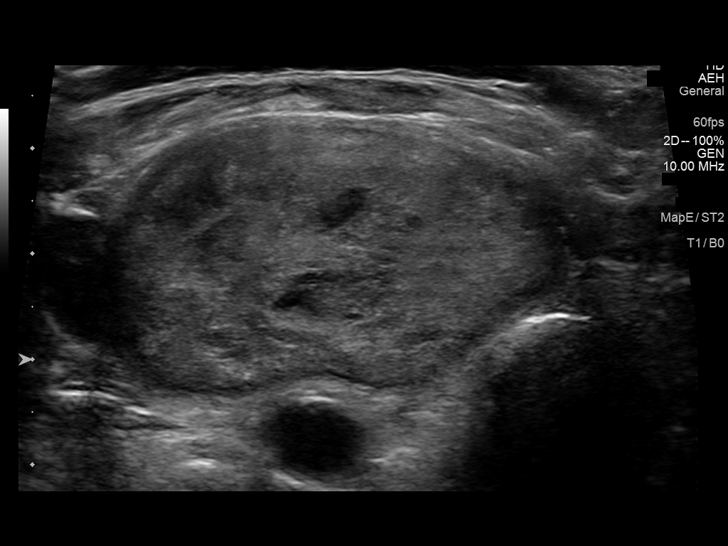

[13 of 25 positions shown; findings below may reference images not displayed]

FINDINGS: Parenchymal Echotexture: Mildly heterogenous

Isthmus: 0.3 cm thickness

Right lobe: 4.9 x 2.9 x 3.8 cm

Left lobe: 4.1 x 1.3 x 1 cm

_________________________________________________________

Estimated total number of nodules >/= 1 cm: 1

Number of spongiform nodules >/=  2 cm not described below (TR1): 0

Number of mixed cystic and solid nodules >/= 1.5 cm not described
below (TR2): 0

_________________________________________________________

Nodule # 1:

Location: Right; Mid

Maximum size: 4.1 cm; Other 2 dimensions: 4.1 x 2.5 cm

Composition: solid/almost completely solid (2)

Echogenicity: hypoechoic (2)

Shape: not taller-than-wide (0)

Margins: smooth (0)

Echogenic foci: none (0)

ACR TI-RADS total points: 4.

ACR TI-RADS risk category: TR4 (4-6 points).

ACR TI-RADS recommendations:

**Given size (>/= 1.5 cm) and appearance, fine needle aspiration of
this moderately suspicious nodule should be considered based on
TI-RADS criteria.
IMPRESSION: 1. Solitary  4.1 cm right thyroid nodule.  Recommend FNA biopsy.

The above is in keeping with the ACR TI-RADS recommendations - [HOSPITAL] 0496;[DATE].

## 2021-02-23 ENCOUNTER — Other Ambulatory Visit: Payer: Self-pay | Admitting: Student

## 2021-03-02 ENCOUNTER — Ambulatory Visit (INDEPENDENT_AMBULATORY_CARE_PROVIDER_SITE_OTHER): Payer: Medicare HMO | Admitting: Pharmacist

## 2021-03-02 ENCOUNTER — Encounter: Payer: Self-pay | Admitting: Pharmacist

## 2021-03-02 VITALS — BP 150/89 | Wt 140.0 lb

## 2021-03-02 DIAGNOSIS — Z0000 Encounter for general adult medical examination without abnormal findings: Secondary | ICD-10-CM | POA: Diagnosis not present

## 2021-03-02 NOTE — Patient Instructions (Addendum)
Things That May Be Affecting Your Health:   Alcohol   Hearing loss x Pain     Depression   Home Safety   Sexual Health    Diabetes   Lack of physical activity   Stress    Difficulty with daily activities   Loneliness x Tiredness    Drug use   Medicines   Tobacco use    Falls   Motor Vehicle Safety   Weight    Food choices   Oral Health   Other      YOUR PERSONALIZED HEALTH PLAN : 1. Schedule your next subsequent Medicare Wellness visit in one year 2. Attend all of your regular appointments to address your medical issues 3. Complete the preventative screenings and services 4. We recommend you obtain your Shingles, flu, and COVID-19 booster shots. Please let us know if you obtain any vaccines outside of our clinic so we may update your records. 5. Please also bring in documentation of your DEXA scan if you have that available     Annual Wellness Visit                       Medicare Covered Preventative Screenings and [place] Men and Women Who How Often Need? Date of Last Service Action  Abdominal Aortic Aneurysm Adults with AAA risk factors Once        Alcohol Misuse and Counseling All Adults Screening once a year if no alcohol misuse. Counseling up to 4 face to face sessions.        Bone Density Measurement  Adults at risk for osteoporosis Once every 2 yrs x      Lipid Panel Z13.6 All adults without CV disease Once every 5 yrs            Colorectal Cancer  Stool sample or Colonoscopy All adults 20 and older   Once every year Every 10 years x            Depression All Adults Once a year   Today    Diabetes Screening Blood glucose, post glucose load, or GTT Z13.1 All adults at risk Pre-diabetics Once per year Twice per year          Diabetes  Self-Management Training All adults Diabetics 10 hrs first year; 2 hours subsequent years. Requires Copay        Glaucoma Diabetics Family history of glaucoma African Americans 42 yrs + Hispanic Americans 49  yrs + Annually - requires coppay          Hepatitis C Z72.89 or F19.20 High Risk for HCV Born between 1945 and 1965 Annually Once x        HIV Z11.4 All adults based on risk Annually btw ages 62 & 50 regardless of risk Annually > 65 yrs if at increased risk          Lung Cancer Screening Asymptomatic adults aged 77-77 with 30 pack yr history and current smoker OR quit within the last 15 yrs Annually Must have counseling and shared decision making documentation before first screen          Medical Nutrition Therapy Adults with  Diabetes Renal disease Kidney transplant within past 3 yrs 3 hours first year; 2 hours subsequent years        Obesity and Counseling All adults Screening once a year Counseling if BMI 30 or higher   Today    Tobacco Use Counseling Adults who  use tobacco  Up to 8 visits in one year        Vaccines Z23 Hepatitis B Influenza  Pneumonia  Adults   Once Once every flu season Two different vaccines separated by one year        Next Annual Wellness Visit People with Medicare Every year   Today        [place] Women Who How Often Need  Date of Last Service Action  Mammogram  Z12.31 Women over 61 One baseline ages 85-39. Annually ager 40 yrs+          Pap tests All women Annually if high risk. Every 2 yrs for normal risk women          Screening for cervical cancer with  Pap (Z01.419 nl or Z01.411abnl) & HPV Z11.51 Women aged 60 to 48 Once every 5 yrs        Screening pelvic and breast exams All women Annually if high risk. Every 2 yrs for normal risk women        Sexually Transmitted Diseases Chlamydia Gonorrhea Syphilis All at risk adults Annually for non pregnant females at increased risk                [place] Men Who How Ofter Need  Date of Last Service Action  Prostate Cancer - DRE & PSA Men over 50 Annually.  DRE might require a copay.              Sexually Transmitted Diseases Syphilis All at risk adults  Annually for men at increased risk          Health Maintenance List     Health Maintenance  Topic Date Due   Hepatitis C Screening  Never done   Zoster Vaccines- Shingrix (1 of 2) Never done   DEXA SCAN  Never done   COVID-19 Vaccine (3 - Booster for Pfizer series) 11/11/2019   INFLUENZA VACCINE  11/17/2020   PNA vac Low Risk Adult (2 of 2 - PPSV23) 07/31/2021   TETANUS/TDAP  08/01/2030   HPV VACCINES  Aged Out     Fall Prevention in the Home, Adult Falls can cause injuries and can happen to people of all ages. There are many things you can do to make your home safe and to help prevent falls. Ask for help when making these changes. What actions can I take to prevent falls? General Instructions Use good lighting in all rooms. Replace any light bulbs that burn out. Turn on the lights in dark areas. Use night-lights. Keep items that you use often in easy-to-reach places. Lower the shelves around your home if needed. Set up your furniture so you have a clear path. Avoid moving your furniture around. Do not have throw rugs or other things on the floor that can make you trip. Avoid walking on wet floors. If any of your floors are uneven, fix them. Add color or contrast paint or tape to clearly mark and help you see: Grab bars or handrails. First and last steps of staircases. Where the edge of each step is. If you use a stepladder: Make sure that it is fully opened. Do not climb a closed stepladder. Make sure the sides of the stepladder are locked in place. Ask someone to hold the stepladder while you use it. Know where your pets are when moving through your home. What can I do in the bathroom?   Keep the floor dry. Clean up any  water on the floor right away. Remove soap buildup in the tub or shower. Use nonskid mats or decals on the floor of the tub or shower. Attach bath mats securely with double-sided, nonslip rug tape. If you need to sit down in the shower, use a plastic,  nonslip stool. Install grab bars by the toilet and in the tub and shower. Do not use towel bars as grab bars. What can I do in the bedroom? Make sure that you have a light by your bed that is easy to reach. Do not use any sheets or blankets for your bed that hang to the floor. Have a firm chair with side arms that you can use for support when you get dressed. What can I do in the kitchen? Clean up any spills right away. If you need to reach something above you, use a step stool with a grab bar. Keep electrical cords out of the way. Do not use floor polish or wax that makes floors slippery. What can I do with my stairs? Do not leave any items on the stairs. Make sure that you have a light switch at the top and the bottom of the stairs. Make sure that there are handrails on both sides of the stairs. Fix handrails that are broken or loose. Install nonslip stair treads on all your stairs. Avoid having throw rugs at the top or bottom of the stairs. Choose a carpet that does not hide the edge of the steps on the stairs. Check carpeting to make sure that it is firmly attached to the stairs. Fix carpet that is loose or worn. What can I do on the outside of my home? Use bright outdoor lighting. Fix the edges of walkways and driveways and fix any cracks. Remove anything that might make you trip as you walk through a door, such as a raised step or threshold. Trim any bushes or trees on paths to your home. Check to see if handrails are loose or broken and that both sides of all steps have handrails. Install guardrails along the edges of any raised decks and porches. Clear paths of anything that can make you trip, such as tools or rocks. Have leaves, snow, or ice cleared regularly. Use sand or salt on paths during winter. Clean up any spills in your garage right away. This includes grease or oil spills. What other actions can I take? Wear shoes that: Have a low heel. Do not wear high heels. Have  rubber bottoms. Feel good on your feet and fit well. Are closed at the toe. Do not wear open-toe sandals. Use tools that help you move around if needed. These include: Canes. Walkers. Scooters. Crutches. Review your medicines with your doctor. Some medicines can make you feel dizzy. This can increase your chance of falling. Ask your doctor what else you can do to help prevent falls. Where to find more information Centers for Disease Control and Prevention, [place]: http://www.wolf.info/ National Institute on Aging: http://kim-miller.com/ Contact a doctor if: You are afraid of falling at home. You feel weak, drowsy, or dizzy at home. You fall at home. Summary There are many simple things that you can do to make your home safe and to help prevent falls. Ways to make your home safe include removing things that can make you trip and installing grab bars in the bathroom. Ask for help when making these changes in your home. This information is not intended to replace advice given to you by your health care  provider. Make sure you discuss any questions you have with your health care provider. Document Revised: 11/07/2019 Document Reviewed: 11/07/2019 Elsevier Patient Education  [place] Maintenance, Female Adopting a healthy lifestyle and getting preventive care are important in promoting health and wellness. Ask your health care provider about: The right schedule for you to have regular tests and exams. Things you can do on your own to prevent diseases and keep yourself healthy. What should I know about diet, weight, and exercise? Eat a healthy diet  Eat a diet that includes plenty of vegetables, fruits, low-fat dairy products, and lean protein. Do not eat a lot of foods that are high in solid fats, added sugars, or sodium. Maintain a healthy weight Body mass index (BMI) is used to identify weight problems. It estimates body fat based on height and weight. Your health care provider can  help determine your BMI and help you achieve or maintain a healthy weight. Get regular exercise Get regular exercise. This is one of the most important things you can do for your health. Most adults should: Exercise for at least 150 minutes each week. The exercise should increase your heart rate and make you sweat (moderate-intensity exercise). Do strengthening exercises at least twice a week. This is in addition to the moderate-intensity exercise. Spend less time sitting. Even light physical activity can be beneficial. Watch cholesterol and blood lipids Have your blood tested for lipids and cholesterol at 76 years of age, then have this test every 5 years. Have your cholesterol levels checked more often if: Your lipid or cholesterol levels are high. You are older than 77 years of age. You are at high risk for heart disease. What should I know about cancer screening? Depending on your health history and family history, you may need to have cancer screening at various ages. This may include screening for: Breast cancer. Cervical cancer. Colorectal cancer. Skin cancer. Lung cancer. What should I know about heart disease, diabetes, and high blood pressure? Blood pressure and heart disease High blood pressure causes heart disease and increases the risk of stroke. This is more likely to develop in people who have high blood pressure readings or are overweight. Have your blood pressure checked: Every 3-5 years if you are 54-40 years of age. Every year if you are 5 years old or older. Diabetes Have regular diabetes screenings. This checks your fasting blood sugar level. Have the screening done: Once every three years after age 27 if you are at a normal weight and have a low risk for diabetes. More often and at a younger age if you are overweight or have a high risk for diabetes. What should I know about preventing infection? Hepatitis B If you have a higher risk for hepatitis B, you should  be screened for this virus. Talk with your health care provider to find out if you are at risk for hepatitis B infection. Hepatitis C Testing is recommended for: Everyone born from [birth year] through 1965. Anyone with known risk factors for hepatitis C. Sexually transmitted infections (STIs) Get screened for STIs, including gonorrhea and chlamydia, if: You are sexually active and are younger than 76 years of age. You are older than 76 years of age and your health care provider tells you that you are at risk for this type of infection. Your sexual activity has changed since you were last screened, and you are at increased risk for chlamydia or gonorrhea. Ask your health care provider if you are  at risk. Ask your health care provider about whether you are at high risk for HIV. Your health care provider may recommend a prescription medicine to help prevent HIV infection. If you choose to take medicine to prevent HIV, you should first get tested for HIV. You should then be tested every 3 months for as long as you are taking the medicine. Pregnancy If you are about to stop having your period (premenopausal) and you may become pregnant, seek counseling before you get pregnant. Take 400 to 800 micrograms (mcg) of folic acid every day if you become pregnant. Ask for birth control (contraception) if you want to prevent pregnancy. Osteoporosis and menopause Osteoporosis is a disease in which the bones lose minerals and strength with aging. This can result in bone fractures. If you are 59 years old or older, or if you are at risk for osteoporosis and fractures, ask your health care provider if you should: Be screened for bone loss. Take a calcium or vitamin D supplement to lower your risk of fractures. Be given hormone replacement therapy (HRT) to treat symptoms of menopause. Follow these instructions at home: Alcohol use Do not drink alcohol if: Your health care provider tells you not to drink. You are  pregnant, may be pregnant, or are planning to become pregnant. If you drink alcohol: Limit how much you have to: 0-1 drink a day. Know how much alcohol is in your drink. In the [place] one drink equals one 12 oz bottle of beer (355 mL), one 5 oz glass of wine (148 mL), or one 1 oz glass of hard liquor (44 mL). Lifestyle Do not use any products that contain nicotine or tobacco. These products include cigarettes, chewing tobacco, and vaping devices, such as e-cigarettes. If you need help quitting, ask your health care provider. Do not use street drugs. Do not share needles. Ask your health care provider for help if you need support or information about quitting drugs. General instructions Schedule regular health, dental, and eye exams. Stay current with your vaccines. Tell your health care provider if: You often feel depressed. You have ever been abused or do not feel safe at home. Summary Adopting a healthy lifestyle and getting preventive care are important in promoting health and wellness. Follow your health care provider's instructions about healthy diet, exercising, and getting tested or screened for diseases. Follow your health care provider's instructions on monitoring your cholesterol and blood pressure. This information is not intended to replace advice given to you by your health care provider. Make sure you discuss any questions you have with your health care provider. Document Revised: 08/25/2020 Document Reviewed: 08/25/2020 Elsevier Patient Education  [place].

## 2021-03-02 NOTE — Progress Notes (Signed)
This AWV is being conducted by [place] only. The patient was located at home and I was located in [facility]. The patient's identity was confirmed using their DOB and current address. The patient or his/her legal guardian has consented to being evaluated through a telephone encounter and understands the associated risks (an examination cannot be done and the patient may need to come in for an appointment) / benefits (allows the patient to remain at home, decreasing exposure to coronavirus). I personally spent 30 minutes conducting the AWV.  Subjective:   Donna Lawson is a 75 y.o. female who presents for a Medicare Annual Wellness Visit.  The following items have been reviewed and updated today in the appropriate area in the EMR.   Health Risk Assessment  Height, weight, BMI, and BP Visual acuity if needed Depression screen Fall risk / safety level Advance directive discussion Medical and family history were reviewed and updated Updating list of other providers & suppliers Medication reconciliation, including over the counter medicines Cognitive screen Written screening schedule Risk Factor list Personalized health advice, risky behaviors, and treatment advice  Social History   Social History Narrative   Widowed, lives alone with 2 dogs      Current Social History 03/02/2021        Patient lives with grandson in a home which is 2 stories. There are not steps up to the entrance the patient uses.       Patient's method of transportation is via family member.      The highest level of education was high school diploma.      The patient currently retired.      Identified important Relationships are "my children"       Pets : 1 dog       Interests / Fun: "I weed, sometimes I paint, I watch TV, I take care of my home"       Current Stressors: "none"       Religious / Personal Beliefs: "Catholic"        Cardiac Risk Factors include: advanced age (>57men, >59  women);sedentary lifestyle;hypertension;dyslipidemia    Objective:    Vitals: BP (!) 150/89   Wt 140 lb (63.5 kg)   BMI 25.61 kg/m  Vitals are patient reported  Activities of Daily Living In your present state of health, do you have any difficulty performing the following activities: 03/02/2021 08/28/2020  Hearing? N N  Vision? Y N  Difficulty concentrating or making decisions? N N  Walking or climbing stairs? N N  Dressing or bathing? N N  Doing errands, shopping? N N  Preparing Food and eating ? N -  Using the Toilet? N -  In the past six months, have you accidently leaked urine? N -  Do you have problems with loss of bowel control? N -  Managing your Medications? N -  Managing your Finances? N -  Housekeeping or managing your Housekeeping? N -  Some recent data might be hidden    Goals  Goals      Patient Stated     "I would like my health to improve to how it was before my stroke, especially my vision"        Fall Risk Fall Risk  03/02/2021 07/31/2020 06/18/2020 02/25/2017 07/01/2016  Falls in the past year? 0 0 0 No No  Number falls in past yr: 0 - - - -  Injury with Fall? 0 - - - -  Risk for fall  due to : No Fall Risks No Fall Risks No Fall Risks - -  Follow up Falls evaluation completed;Falls prevention discussed;Education provided - Falls prevention discussed - -    Depression Screen PHQ 2/9 Scores 07/31/2020 06/18/2020 02/25/2017 07/01/2016  PHQ - 2 Score 0 0 0 0  PHQ- 9 Score 0 1 - -     Cognitive Testing Six-Item Cognitive Screener   "I would like to ask you some questions that ask you to use your memory. I am going to name three objects. Please wait until I say all three words, then repeat them. Remember what they are  because I am going to ask you to name them again in a few minutes. Please repeat these words for me: APPLE--TABLE--PENNY." (Interviewer may repeat names 3 times if necessary but repetition not scored.)  Did patient correctly repeat all three  words? Yes - may proceed with screen  What year is this? Correct What month is this? Correct What day of the week is this? Correct  What were the three objects I asked you to remember? Apple Correct Table Correct Penny Correct  Score one point for each incorrect answer.  A score of 2 or more points warrants additional investigation.  Patient's score 0     Assessment and Plan:    During the course of the visit the patient was educated and counseled about appropriate screening and preventive services as documented in the assessment and plan.  Recommended patient obtain Shingles, flu, and COVID-19 booster vaccines. Patient states she will obtain all vaccines besides the Shingles. She does not want this vaccine.  The printed AVS was given to the patient and included an updated screening schedule, a list of risk factors, and personalized health advice.      Hughes Better, RPH-CPP  03/02/2021

## 2021-03-04 NOTE — Progress Notes (Signed)
Resident attestation Reviewed visit by Dr. Georgina Peer, agree with recommendations.  Will forward to attending for any other notes or attestation needed.

## 2021-03-06 NOTE — Progress Notes (Signed)
Internal Medicine Clinic Attending  Case discussed with Dr. Johnney Ou.  I reviewed the AWV findings.  I agree with the assessment, diagnosis, and plan of care documented in the AWV note.

## 2021-03-21 ENCOUNTER — Encounter (HOSPITAL_COMMUNITY): Payer: Self-pay | Admitting: *Deleted

## 2021-03-21 ENCOUNTER — Emergency Department (HOSPITAL_COMMUNITY): Payer: Medicare HMO

## 2021-03-21 ENCOUNTER — Other Ambulatory Visit: Payer: Self-pay

## 2021-03-21 ENCOUNTER — Emergency Department (HOSPITAL_COMMUNITY)
Admission: EM | Admit: 2021-03-21 | Discharge: 2021-03-22 | Disposition: A | Discharge status: Home / Self Care | Payer: Medicare HMO | Attending: Emergency Medicine | Admitting: Emergency Medicine

## 2021-03-21 DIAGNOSIS — R29818 Other symptoms and signs involving the nervous system: Secondary | ICD-10-CM | POA: Diagnosis not present

## 2021-03-21 DIAGNOSIS — I10 Essential (primary) hypertension: Secondary | ICD-10-CM | POA: Insufficient documentation

## 2021-03-21 DIAGNOSIS — Y900 Blood alcohol level of less than 20 mg/100 ml: Secondary | ICD-10-CM | POA: Insufficient documentation

## 2021-03-21 DIAGNOSIS — H538 Other visual disturbances: Secondary | ICD-10-CM | POA: Diagnosis not present

## 2021-03-21 DIAGNOSIS — N289 Disorder of kidney and ureter, unspecified: Secondary | ICD-10-CM | POA: Diagnosis not present

## 2021-03-21 DIAGNOSIS — I672 Cerebral atherosclerosis: Secondary | ICD-10-CM | POA: Diagnosis not present

## 2021-03-21 DIAGNOSIS — Z20822 Contact with and (suspected) exposure to covid-19: Secondary | ICD-10-CM | POA: Insufficient documentation

## 2021-03-21 DIAGNOSIS — Z79899 Other long term (current) drug therapy: Secondary | ICD-10-CM | POA: Diagnosis not present

## 2021-03-21 DIAGNOSIS — Z7982 Long term (current) use of aspirin: Secondary | ICD-10-CM | POA: Insufficient documentation

## 2021-03-21 DIAGNOSIS — E876 Hypokalemia: Secondary | ICD-10-CM | POA: Diagnosis not present

## 2021-03-21 DIAGNOSIS — I6529 Occlusion and stenosis of unspecified carotid artery: Secondary | ICD-10-CM | POA: Diagnosis not present

## 2021-03-21 DIAGNOSIS — I639 Cerebral infarction, unspecified: Secondary | ICD-10-CM | POA: Insufficient documentation

## 2021-03-21 DIAGNOSIS — Z87891 Personal history of nicotine dependence: Secondary | ICD-10-CM | POA: Diagnosis not present

## 2021-03-21 DIAGNOSIS — J341 Cyst and mucocele of nose and nasal sinus: Secondary | ICD-10-CM | POA: Diagnosis not present

## 2021-03-21 DIAGNOSIS — R69 Illness, unspecified: Secondary | ICD-10-CM | POA: Diagnosis not present

## 2021-03-21 LAB — DIFFERENTIAL
Abs Immature Granulocytes: 0.05 10*3/uL (ref 0.00–0.07)
Basophils Absolute: 0.1 10*3/uL (ref 0.0–0.1)
Basophils Relative: 1 %
Eosinophils Absolute: 0.2 10*3/uL (ref 0.0–0.5)
Eosinophils Relative: 3 %
Immature Granulocytes: 1 %
Lymphocytes Relative: 13 %
Lymphs Abs: 1.2 10*3/uL (ref 0.7–4.0)
Monocytes Absolute: 0.5 10*3/uL (ref 0.1–1.0)
Monocytes Relative: 6 %
Neutro Abs: 7.3 10*3/uL (ref 1.7–7.7)
Neutrophils Relative %: 76 %

## 2021-03-21 LAB — I-STAT CHEM 8, ED
BUN: 26 mg/dL — ABNORMAL HIGH (ref 8–23)
Calcium, Ion: 1.11 mmol/L — ABNORMAL LOW (ref 1.15–1.40)
Chloride: 101 mmol/L (ref 98–111)
Creatinine, Ser: 2 mg/dL — ABNORMAL HIGH (ref 0.44–1.00)
Glucose, Bld: 131 mg/dL — ABNORMAL HIGH (ref 70–99)
HCT: 32 % — ABNORMAL LOW (ref 36.0–46.0)
Hemoglobin: 10.9 g/dL — ABNORMAL LOW (ref 12.0–15.0)
Potassium: 2.8 mmol/L — ABNORMAL LOW (ref 3.5–5.1)
Sodium: 136 mmol/L (ref 135–145)
TCO2: 25 mmol/L (ref 22–32)

## 2021-03-21 LAB — PROTIME-INR
INR: 1.1 (ref 0.8–1.2)
Prothrombin Time: 13.8 seconds (ref 11.4–15.2)

## 2021-03-21 LAB — RESP PANEL BY RT-PCR (FLU A&B, COVID) ARPGX2
Influenza A by PCR: NEGATIVE
Influenza B by PCR: NEGATIVE
SARS Coronavirus 2 by RT PCR: NEGATIVE

## 2021-03-21 LAB — URINALYSIS, ROUTINE W REFLEX MICROSCOPIC
Bilirubin Urine: NEGATIVE
Glucose, UA: 50 mg/dL — AB
Ketones, ur: NEGATIVE mg/dL
Leukocytes,Ua: NEGATIVE
Nitrite: NEGATIVE
Protein, ur: >=300 mg/dL — AB
Specific Gravity, Urine: 1.004 — ABNORMAL LOW (ref 1.005–1.030)
pH: 6 (ref 5.0–8.0)

## 2021-03-21 LAB — CBC
HCT: 34 % — ABNORMAL LOW (ref 36.0–46.0)
Hemoglobin: 11.8 g/dL — ABNORMAL LOW (ref 12.0–15.0)
MCH: 26.7 pg (ref 26.0–34.0)
MCHC: 34.7 g/dL (ref 30.0–36.0)
MCV: 76.9 fL — ABNORMAL LOW (ref 80.0–100.0)
Platelets: 262 10*3/uL (ref 150–400)
RBC: 4.42 MIL/uL (ref 3.87–5.11)
RDW: 14.8 % (ref 11.5–15.5)
WBC: 9.3 10*3/uL (ref 4.0–10.5)
nRBC: 0 % (ref 0.0–0.2)

## 2021-03-21 LAB — COMPREHENSIVE METABOLIC PANEL
ALT: 10 U/L (ref 0–44)
AST: 16 U/L (ref 15–41)
Albumin: 3.9 g/dL (ref 3.5–5.0)
Alkaline Phosphatase: 98 U/L (ref 38–126)
Anion gap: 10 (ref 5–15)
BUN: 25 mg/dL — ABNORMAL HIGH (ref 8–23)
CO2: 25 mmol/L (ref 22–32)
Calcium: 9.4 mg/dL (ref 8.9–10.3)
Chloride: 100 mmol/L (ref 98–111)
Creatinine, Ser: 1.84 mg/dL — ABNORMAL HIGH (ref 0.44–1.00)
GFR, Estimated: 28 mL/min — ABNORMAL LOW (ref >60)
Glucose, Bld: 146 mg/dL — ABNORMAL HIGH (ref 70–99)
Potassium: 2.7 mmol/L — CL (ref 3.5–5.1)
Sodium: 135 mmol/L (ref 135–145)
Total Bilirubin: 0.9 mg/dL (ref 0.3–1.2)
Total Protein: 7.3 g/dL (ref 6.5–8.1)

## 2021-03-21 LAB — RAPID URINE DRUG SCREEN, HOSP PERFORMED
Amphetamines: NOT DETECTED
Barbiturates: NOT DETECTED
Benzodiazepines: NOT DETECTED
Cocaine: NOT DETECTED
Opiates: NOT DETECTED
Tetrahydrocannabinol: NOT DETECTED

## 2021-03-21 LAB — ETHANOL: Alcohol, Ethyl (B): <10 mg/dL (ref <10)

## 2021-03-21 LAB — APTT: aPTT: 32 seconds (ref 24–36)

## 2021-03-21 NOTE — ED Triage Notes (Signed)
The pt is c/o  rt eye blurred since this am  stroke in may that involved her lt eye alert  and oriented x 4

## 2021-03-21 NOTE — ED Provider Notes (Addendum)
Emergency Medicine Provider Triage Evaluation Note  Donna Lawson , a 75 y.o. female  was evaluated in triage.  Pt complains of right eye blurry vision that began at 0900 this morning. Blurriness started suddenly and has not gotten any better. Associated HA. She has had a previous CVA last may with similar symptoms. Denies any other stroke symptoms  Review of Systems  Positive: See above Negative:   Physical Exam  BP (!) 183/90 (BP Location: Left Arm)   Pulse 82   Temp 98.8 F (37.1 C) (Oral)   Resp 15   SpO2 97%  Gen:   Awake, no distress   Resp:  Normal effort  MSK:   Moves extremities without difficulty  Other:  Speech normal. A and O x3. Motor normal. Sensation normal. Gait normal. Coordination normal..  Medical Decision Making  Medically screening exam initiated at 8:06 PM.  Appropriate orders placed.  Donna Lawson was informed that the remainder of the evaluation will be completed by another provider, this initial triage assessment does not replace that evaluation, and the importance of remaining in the ED until their evaluation is complete.     Adolphus Birchwood, PA-C 03/21/21 [address] 03/21/21 2009    Carmin Muskrat, MD 03/21/21 2043

## 2021-03-21 NOTE — ED Notes (Signed)
Pt called 3x no response  

## 2021-03-22 MED ORDER — CARVEDILOL 3.125 MG PO TABS: 6.2500 mg | ORAL_TABLET | Freq: Two times a day (BID) | ORAL | 3 refills | Status: DC

## 2021-03-22 NOTE — ED Notes (Signed)
Provider at bedside

## 2021-03-22 NOTE — ED Notes (Signed)
Visual acuity Rt eye 20/50 Lt eye 20/63, bilat eye 20/50. Wears reading glasses.

## 2021-03-22 NOTE — ED Notes (Signed)
Pt now in room.   

## 2021-03-22 NOTE — ED Notes (Signed)
Pt not in room at this time

## 2021-03-22 NOTE — ED Provider Notes (Signed)
[facility] 2 EMERGENCY DEPARTMENT Provider Note   CSN: [id number] Arrival date & time: 03/21/21  1847     History Chief Complaint  Patient presents with   Eye Problem    Donna Lawson is a 75 y.o. female.  Had an episode of abnormal vision the earlier in the day. It happened in her right eye. She had a stroke in the past which was an RAO so she was worried for another stroke.  She did not lose vision in her right eye this time.  She states that the color seem to be altered somehow.  She had no other associated symptoms.  No weakness, paresthesias or numbness.  No facial droop according to her daughter.  She can see normally out of her eye now.  She usually wears glasses but within that limitation she feels like her vision is normal and its been normal the whole time that she has been here.  She has an appointment with her primary doctor this week.  She has appointments with vascular coming up and neurology as well.   Eye Problem     Past Medical History:  Diagnosis Date   Anxiety    Arthritis    Diverticula, colon 1999   Heart murmur    History of blood transfusion    Hyperlipidemia    Hypertension    Osteoarthritis of knee    right   PONV (postoperative nausea and vomiting)     Patient Active Problem List   Diagnosis Date Noted   Left carotid artery stenosis 11/27/2020   Hardening of the aorta (main artery of the heart) ([place]) 09/25/2020   Prediabetes 09/25/2020   Thyroid nodule 09/25/2020   Vitamin D deficiency 08/24/2020   Retinal artery occlusion 08/21/2020   AKI (acute kidney injury) ([place]) 08/01/2020   Recurrent upper abdominal pain with history of cholecystectomy 08/01/2020   Hypokalemia 07/17/2020   Chronic cough 06/18/2020   Microcytic anemia 06/18/2020   PAC (premature atrial contraction) 06/17/2020   Anxiety 03/29/2013   Hypertension    Hyperlipidemia    Osteoarthritis of knee     Past Surgical History:  Procedure Laterality Date    ABDOMINAL SURGERY     CHOLECYSTECTOMY N/A 07/01/2020   Procedure: LAPAROSCOPIC CHOLECYSTECTOMY;  Surgeon: Jovita Kussmaul, MD;  Location: [facility];  Service: General;  Laterality: N/A;   COLONOSCOPY     GALLBLADDER SURGERY  07/2020   INTRAOPERATIVE CHOLANGIOGRAM N/A 07/01/2020   Procedure: INTRAOPERATIVE CHOLANGIOGRAM;  Surgeon: Jovita Kussmaul, MD;  Location: [place];  Service: General;  Laterality: N/A;   TRANSCAROTID ARTERY REVASCULARIZATION  Left 08/27/2020   Procedure: LEFT TRANSCAROTID ARTERY REVASCULARIZATION;  Surgeon: Waynetta Sandy, MD;  Location: [place];  Service: Vascular;  Laterality: Left;   ULTRASOUND GUIDANCE FOR VASCULAR ACCESS Right 08/27/2020   Procedure: ULTRASOUND GUIDANCE FOR VASCULAR ACCESS;  Surgeon: Waynetta Sandy, MD;  Location: [place];  Service: Vascular;  Laterality: Right;     OB History   No obstetric history on file.     Family History  Problem Relation Age of Onset   Hypertension Son    Hypertension Other    Arthritis Adoptive Father    Arthritis Adoptive Mother     Social History   Tobacco Use   Smoking status: Former   Smokeless tobacco: Never  Scientific laboratory technician Use: Never used  Substance Use Topics   Alcohol use: No    Alcohol/week: 0.0 standard drinks   Drug use:  No    Home Medications Prior to Admission medications   Medication Sig Start Date End Date Taking? Authorizing Provider  ALPRAZolam Duanne Moron) 0.5 MG tablet Take 1 tablet (0.5 mg total) at bedtime as needed by mouth for anxiety or sleep. 02/25/17   Nche, Charlene Brooke, NP  amLODipine (NORVASC) 10 MG tablet Take 1 tablet (10 mg total) by mouth daily. 02/20/21   Gaylan Gerold, DO  aspirin 81 MG EC tablet TAKE 1 TABLET (81 MG TOTAL) BY MOUTH DAILY. SWALLOW WHOLE. 09/24/20   Sanjuan Dame, MD  atorvastatin (LIPITOR) 40 MG tablet Take 1 tablet by mouth once daily 01/06/21   Gaylan Gerold, DO  carvedilol (COREG) 3.125 MG tablet Take 2 tablets (6.25 mg total) by mouth 2  (two) times daily with a meal. 03/22/21   Alva Kuenzel, Corene Cornea, MD  lisinopril-hydrochlorothiazide (PRINZIDE,ZESTORETIC) 20-25 MG per tablet Take 1 tablet by mouth 2 (two) times daily. Take 1 by mouth daily 03/17/12 05/10/12  [provider]    Allergies    Atorvastatin, Crestor [rosuvastatin calcium], Lisinopril, Cefuroxime axetil, and Oxycodone-acetaminophen  Review of Systems   Review of Systems  All other systems reviewed and are negative.  Physical Exam Updated Vital Signs BP (!) 185/77 (BP Location: Right Arm)   Pulse 61   Temp (!) 97.4 F (36.3 C) (Oral)   Resp 14   Ht 5' (1.524 m)   Wt 63.5 kg   SpO2 98%   BMI 27.34 kg/m   Physical Exam Vitals and nursing note reviewed.  Constitutional:      Appearance: She is well-developed.  HENT:     Head: Normocephalic and atraumatic.     Nose: No congestion or rhinorrhea.     Mouth/Throat:     Mouth: Mucous membranes are moist.     Pharynx: Oropharynx is clear.  Eyes:     Pupils: Pupils are equal, round, and reactive to light.  Cardiovascular:     Rate and Rhythm: Normal rate and regular rhythm.  Pulmonary:     Effort: No respiratory distress.     Breath sounds: No stridor.  Abdominal:     General: Abdomen is flat. There is no distension.  Musculoskeletal:        General: No swelling or tenderness. Normal range of motion.     Cervical back: Normal range of motion.  Skin:    General: Skin is warm and dry.  Neurological:     General: No focal deficit present.     Mental Status: She is alert and oriented to person, place, and time. Mental status is at baseline.     Cranial Nerves: No cranial nerve deficit.     Motor: No weakness.     Coordination: Coordination normal.    ED Results / Procedures / Treatments   Labs (all labs ordered are listed, but only abnormal results are displayed) Labs Reviewed  CBC - Abnormal; Notable for the following components:      Result Value   Hemoglobin 11.8 (*)    HCT 34.0 (*)     MCV 76.9 (*)    All other components within normal limits  COMPREHENSIVE METABOLIC PANEL - Abnormal; Notable for the following components:   Potassium 2.7 (*)    Glucose, Bld 146 (*)    BUN 25 (*)    Creatinine, Ser 1.84 (*)    GFR, Estimated 28 (*)    All other components within normal limits  URINALYSIS, ROUTINE W REFLEX MICROSCOPIC - Abnormal; Notable for the following  components:   Color, Urine STRAW (*)    Specific Gravity, Urine 1.004 (*)    Glucose, UA 50 (*)    Hgb urine dipstick SMALL (*)    Protein, ur >=300 (*)    Bacteria, UA RARE (*)    All other components within normal limits  I-STAT CHEM 8, ED - Abnormal; Notable for the following components:   Potassium 2.8 (*)    BUN 26 (*)    Creatinine, Ser 2.00 (*)    Glucose, Bld 131 (*)    Calcium, Ion 1.11 (*)    Hemoglobin 10.9 (*)    HCT 32.0 (*)    All other components within normal limits  RESP PANEL BY RT-PCR (FLU A&B, COVID) ARPGX2  ETHANOL  PROTIME-INR  APTT  DIFFERENTIAL  RAPID URINE DRUG SCREEN, HOSP PERFORMED    EKG EKG Interpretation  Date/Time:  Saturday March 21 2021 20:09:16 EST Ventricular Rate:  76 PR Interval:  192 QRS Duration: 86 QT Interval:  426 QTC Calculation: 479 R Axis:   79 Text Interpretation: Sinus rhythm with occasional Premature ventricular complexes Nonspecific ST and T wave abnormality Prolonged QT Abnormal ECG Confirmed by Merrily Pew [phone]) on 03/22/2021 4:51:19 AM  Radiology CT Head Wo Contrast  Result Date: 03/21/2021 CLINICAL DATA:  Neuro deficit, acute, stroke suspected EXAM: CT HEAD WITHOUT CONTRAST TECHNIQUE: Contiguous axial images were obtained from the base of the skull through the vertex without intravenous contrast. COMPARISON:  None. BRAIN: BRAIN Patchy and confluent areas of decreased attenuation are noted throughout the deep and periventricular white matter of the cerebral hemispheres bilaterally, compatible with chronic microvascular ischemic disease. No  evidence of large-territorial acute infarction. No parenchymal hemorrhage. No mass lesion. No extra-axial collection. No mass effect or midline shift. No hydrocephalus. Basilar cisterns are patent. Vascular: No hyperdense vessel. Atherosclerotic calcifications are present within the cavernous internal carotid and vertebral arteries. Skull: No acute fracture or focal lesion. Sinuses/Orbits: Mucosal retention cyst within the left sphenoid sinus. Paranasal sinuses and mastoid air cells are clear. The orbits are unremarkable. Other: None. IMPRESSION: No acute intracranial abnormality. Electronically Signed   By: Iven Finn M.D.   On: 03/21/2021 21:41   MR BRAIN WO CONTRAST  Result Date: 03/21/2021 CLINICAL DATA:  Right eye blurry vision EXAM: MRI HEAD WITHOUT CONTRAST TECHNIQUE: Multiplanar, multiecho pulse sequences of the brain and surrounding structures were obtained without intravenous contrast. COMPARISON:  08/21/2020 FINDINGS: Brain: No acute infarct, mass effect or extra-axial collection. Focus of chronic hemorrhage in the posterior left temporal lobe, unchanged there is multifocal hyperintense T2-weighted signal within the white matter. Generalized volume loss without a clear lobar predilection. The midline structures are normal. Vascular: Major flow voids are preserved. Skull and upper cervical spine: Normal calvarium and skull base. Visualized upper cervical spine and soft tissues are normal. Sinuses/Orbits:No paranasal sinus fluid levels or advanced mucosal thickening. No mastoid or middle ear effusion. Normal orbits. IMPRESSION: 1. No acute intracranial abnormality. 2. Unchanged chronic hemorrhage in the posterior left temporal lobe. Electronically Signed   By: Ulyses Jarred M.D.   On: 03/21/2021 23:39    Procedures Procedures   Medications Ordered in ED Medications - No data to display  ED Course  I have reviewed the triage vital signs and the nursing notes.  Pertinent labs & imaging  results that were available during my care of the patient were reviewed by me and considered in my medical decision making (see chart for details).  Clinical Course as of 03/22/21  2308  Sat Mar 21, 2021  2153 Potassium(!!): 2.7 [GL]    Clinical Course User Index [GL] Loeffler, Adora Fridge, PA-C   MDM Rules/Calculators/A&P                           Symptoms not really consistent with CVA.  Not sure where her transient alteration of color vision came from. She has a negative stroke workup here.  She is hypokalemic which we started repletion here and she will continue repletion at home as she has had this in the past.  She also has a mild renal insufficiency likely related to uncontrolled blood pressures.  She has appoint with her primary doctor this week so I told her to double her carvedilol until that time and they can make further changes as necessary.  Message will be sent to primary doctor.  Final Clinical Impression(s) / ED Diagnoses Final diagnoses:  Hypertension, unspecified type  Hypokalemia  Acute renal insufficiency    Rx / DC Orders ED Discharge Orders          Ordered    carvedilol (COREG) 3.125 MG tablet  2 times daily with meals        03/22/21 0648             Barabara Motz, Corene Cornea, MD 03/22/21 2308

## 2021-03-22 NOTE — Discharge Instructions (Addendum)
Please double your coreg until you follow up with your primary doctor. Please check your blood pressures and heart rate a few times a day and take the log with you to your doctors appointment.   Please start taking your potassium again as well, this will need to be rechecked.  Please continue taking fluids.

## 2021-03-25 ENCOUNTER — Encounter: Payer: Self-pay | Admitting: Student

## 2021-03-25 ENCOUNTER — Other Ambulatory Visit: Payer: Self-pay

## 2021-03-25 ENCOUNTER — Ambulatory Visit (INDEPENDENT_AMBULATORY_CARE_PROVIDER_SITE_OTHER): Payer: Medicare HMO | Admitting: Student

## 2021-03-25 VITALS — BP 166/75 | HR 69

## 2021-03-25 DIAGNOSIS — I10 Essential (primary) hypertension: Secondary | ICD-10-CM | POA: Diagnosis not present

## 2021-03-25 DIAGNOSIS — E876 Hypokalemia: Secondary | ICD-10-CM

## 2021-03-25 DIAGNOSIS — E041 Nontoxic single thyroid nodule: Secondary | ICD-10-CM | POA: Diagnosis not present

## 2021-03-25 DIAGNOSIS — Z23 Encounter for immunization: Secondary | ICD-10-CM

## 2021-03-25 DIAGNOSIS — H579 Unspecified disorder of eye and adnexa: Secondary | ICD-10-CM

## 2021-03-25 DIAGNOSIS — Z0000 Encounter for general adult medical examination without abnormal findings: Secondary | ICD-10-CM

## 2021-03-25 HISTORY — DX: Unspecified disorder of eye and adnexa: H57.9

## 2021-03-25 MED ORDER — OLMESARTAN MEDOXOMIL 20 MG PO TABS
20.0000 mg | ORAL_TABLET | Freq: Every day | ORAL | 2 refills | Status: DC
Start: 2021-03-25 — End: 2021-06-22

## 2021-03-25 MED ORDER — CARVEDILOL 6.25 MG PO TABS: 6.2500 mg | ORAL_TABLET | Freq: Two times a day (BID) | ORAL | 11 refills | Status: DC

## 2021-03-25 NOTE — Assessment & Plan Note (Signed)
Flu shot given today

## 2021-03-25 NOTE — Assessment & Plan Note (Addendum)
Patient with history of chronic hypokalemia.  Potassium was 2.7 in the ED.  Unknown cause of her hypokalemia at this time.  Aldo/renin was normal.  She denies GI losses such as diarrhea or vomiting.  She reports eating a balanced diet with adequate vegetables.  She is not on any diuretics.  Low suspicion for RTA with the lack of acidosis.  Also low suspicion for Bartter or Liddleman with no alkalosis.  -Will repeat BMP today with Mag -Also obtain urine creatinine and urine potassium to calculate fraction excretion of potassium.  If comes back normal, her diet may be the underlying cause.  Addendum BMP showed improvement of AKI with serum creatinine 1.7.  Potassium improved slightly at 3.  Patient has been taking potassium 20 mEq packets instructed by ED provider.  States that she has 7 packets left.  Advised patient to finish the rest in the next 7 days.  She will come back on December 14 for repeat BMP  Calculated fraction excretion of potassium came back as 11.8, suggesting renal causes.  No evidence of RTA on BMP.  She is not taking a diuretic.  If her hypokalemia remains a problem, consider nephrology referral.  Addendum Potassium improved to 3.4.  Creatinine trending down to 1.6 with GFR 33.  Will refill potassium packet 20 mEq daily Follow-up in 1 week

## 2021-03-25 NOTE — Assessment & Plan Note (Signed)
Patient report " exacerbated color" symptom of her right eye that started 1 day prior to her ED visit.  States that color is enhanced, especially with lights.  She denies visual loss but states that she is "looking through a dirty glass".  She denies any neurological deficit. She denies eye pain, headache, temporal pain, jaw pain, dizziness.   CT and MRI done in the ED was negative for acute CVA.  She did had a MRI of her head and neck this year which showed patent right carotid artery.  I do not think this is a vascular issue.  This seems like an ophthalmology issue.  Fortunately she has a follow-up appointment with her ophthalmologist in 2 days.  Advised patient to keep this appointment.

## 2021-03-25 NOTE — Patient Instructions (Signed)
Donna Lawson,  It was a pleasure seeing you in the clinic today.  Here is a summary what we talked about:  1.  Right eye vision change: Please see your ophthalmologist as scheduled.  2.  High blood pressure: I started a medication called olmesartan 25 mg daily.  Please take 1 tablet a day.  Please continue amlodipine 10 mg and carvedilol 6.25 mg twice daily.  I sent a new prescription of your carvedilol to your pharmacy.  3.  Potassium: I will check some blood work to evaluate for the cause of your low potassium.  4.  Thyroid mass: I will check blood work for thyroid function and an ultrasound of your thyroid gland.  Please come back in 2 weeks for blood work and blood pressure recheck.  Take care,  Dr. Alfonse Spruce

## 2021-03-25 NOTE — Assessment & Plan Note (Addendum)
Patient with history of thyroid nodule that was evaluated with thyroid ultrasound in 2021.  She underwent fine-needle biopsy which came back benign.  She states that her nodule is getting bigger.  Exam showed a large right thyroid nodule that can be visualized.  Diameter is approximately 5-6 cm.  The surface is smooth and nontender to palpation.  -We will check TSH and reorder thyroid ultrasound  Addendum TSH normal

## 2021-03-25 NOTE — Assessment & Plan Note (Addendum)
Blood pressure was elevated in the ED at 185/77, slightly improved today 166/75.  She states that her home blood pressure systolic usually in the 407W.  She reports adherence to amlodipine and Coreg.  She was on HCTZ but was discontinued due to hypokalemia.  Her lisinopril was discontinued due to tongue swelling reaction in 2014.  -Will add olmesartan 20 mg.  I do not think patient had angioedema with lisinopril given tongue swelling. -Continue amlodipine 10 mg. -New prescription sent for increased dose of Coreg 6.25 mg twice daily -Return in 2 weeks for blood pressure recheck and BMP

## 2021-03-25 NOTE — Progress Notes (Signed)
   CC: Follow-up on ED visit for high blood pressure and vision change  HPI:  Ms.Gaby Joerger is a 75 y.o. with past medical history of hypertension, left carotid artery stenosis status post revascularization who presented to the clinic today for recent ED visit.  She was seen in the ED on 12/3 for color alteration in her right eye.  No vision loss was reported.  She has no other neurological deficit.  CT and MRI was done which was negative for an acute CVA.  Her blood pressure was elevated there at 185/77.  CBC did not show any leukocytosis.  CMP showed hypokalemia of 2.7 and an AKI with serum Cr of 1.8.  Her carvedilol was increased from 3.125 mg to 6.25 mg by her ED provider and was asked to follow-up with [place].  Please see problem based charting for detail  Past Medical History:  Diagnosis Date   Anxiety    Arthritis    Diverticula, colon 1999   Heart murmur    History of blood transfusion    Hyperlipidemia    Hypertension    Osteoarthritis of knee    right   PONV (postoperative nausea and vomiting)    Review of Systems:  per HPI  Physical Exam:  Vitals:   03/25/21 1341  BP: (!) 166/75  Pulse: 69  SpO2: 99%   Physical Exam Constitutional:      General: She is not in acute distress.    Appearance: She is not ill-appearing or toxic-appearing.  HENT:     Head: Normocephalic.     Comments: No pain to palpation in the right temporal region or jaw pain. Eyes:     General:        Right eye: No discharge.        Left eye: No discharge.     Extraocular Movements: Extraocular movements intact.     Comments: Normal vision of bilateral visual field.  Intact extraocular motor muscle.  No conjunctival erythema.  No discharge.  Neck:     Comments: A large right thyroid nodule visualized and palpated on exam.  The diameter is approximately 5-6 cm.  The surface is smooth with, nontender to palpation. Cardiovascular:     Rate and Rhythm: Normal rate and regular rhythm.      Heart sounds: Normal heart sounds.  Pulmonary:     Effort: Pulmonary effort is normal. No respiratory distress.     Breath sounds: Normal breath sounds. No wheezing.  Abdominal:     General: Bowel sounds are normal. There is no distension.     Palpations: Abdomen is soft.  Musculoskeletal:        General: Normal range of motion.     Cervical back: Normal range of motion.  Skin:    General: Skin is warm.  Neurological:     Mental Status: She is alert.     Comments: Normal strengths bilateral upper and lower extremities  Psychiatric:        Mood and Affect: Mood normal.        Behavior: Behavior normal.     Assessment & Plan:   See Encounters Tab for problem based charting.  Patient discussed with Dr.  Saverio Danker

## 2021-03-26 LAB — BMP8+ANION GAP
Anion Gap: 19 mmol/L — ABNORMAL HIGH (ref 10.0–18.0)
BUN/Creatinine Ratio: 12 (ref 12–28)
BUN: 20 mg/dL (ref 8–27)
CO2: 21 mmol/L (ref 20–29)
Calcium: 9.6 mg/dL (ref 8.7–10.3)
Chloride: 98 mmol/L (ref 96–106)
Creatinine, Ser: 1.72 mg/dL — ABNORMAL HIGH (ref 0.57–1.00)
Glucose: 114 mg/dL — ABNORMAL HIGH (ref 70–99)
Potassium: 3 mmol/L — ABNORMAL LOW (ref 3.5–5.2)
Sodium: 138 mmol/L (ref 134–144)
eGFR: 30 mL/min/{1.73_m2} — ABNORMAL LOW (ref >59)

## 2021-03-26 LAB — TSH: TSH: 1.38 u[IU]/mL (ref 0.450–4.500)

## 2021-03-26 LAB — POTASSIUM, URINE, RANDOM: Potassium Urine: 23.1 mmol/L

## 2021-03-26 LAB — CREATININE, URINE, RANDOM: Creatinine, Urine: 111.6 mg/dL

## 2021-03-26 LAB — MAGNESIUM: Magnesium: 2.2 mg/dL (ref 1.6–2.3)

## 2021-03-26 NOTE — Addendum Note (Signed)
Addended byGaylan Gerold on: 03/26/2021 01:37 PM   Modules accepted: Orders

## 2021-03-27 DIAGNOSIS — H348322 Tributary (branch) retinal vein occlusion, left eye, stable: Secondary | ICD-10-CM | POA: Diagnosis not present

## 2021-03-27 DIAGNOSIS — H35033 Hypertensive retinopathy, bilateral: Secondary | ICD-10-CM | POA: Diagnosis not present

## 2021-03-27 DIAGNOSIS — H34232 Retinal artery branch occlusion, left eye: Secondary | ICD-10-CM | POA: Diagnosis not present

## 2021-03-30 NOTE — Progress Notes (Signed)
Internal Medicine Clinic Attending  Case discussed with Dr. Nguyen  At the time of the visit.  We reviewed the resident's history and exam and pertinent patient test results.  I agree with the assessment, diagnosis, and plan of care documented in the resident's note. 

## 2021-03-31 ENCOUNTER — Other Ambulatory Visit: Payer: Self-pay

## 2021-03-31 ENCOUNTER — Other Ambulatory Visit (INDEPENDENT_AMBULATORY_CARE_PROVIDER_SITE_OTHER): Payer: Medicare HMO

## 2021-03-31 DIAGNOSIS — E876 Hypokalemia: Secondary | ICD-10-CM

## 2021-03-31 LAB — BASIC METABOLIC PANEL
Anion gap: 8 (ref 5–15)
BUN: 19 mg/dL (ref 8–23)
CO2: 24 mmol/L (ref 22–32)
Calcium: 9.2 mg/dL (ref 8.9–10.3)
Chloride: 105 mmol/L (ref 98–111)
Creatinine, Ser: 1.61 mg/dL — ABNORMAL HIGH (ref 0.44–1.00)
GFR, Estimated: 33 mL/min — ABNORMAL LOW (ref >60)
Glucose, Bld: 112 mg/dL — ABNORMAL HIGH (ref 70–99)
Potassium: 3.4 mmol/L — ABNORMAL LOW (ref 3.5–5.1)
Sodium: 137 mmol/L (ref 135–145)

## 2021-04-01 MED ORDER — POTASSIUM CHLORIDE 20 MEQ PO PACK: 20.0000 meq | PACK | Freq: Every day | ORAL | 0 refills | Status: DC

## 2021-04-01 NOTE — Addendum Note (Signed)
Addended byGaylan Gerold on: 04/01/2021 01:13 PM   Modules accepted: Orders

## 2021-04-07 ENCOUNTER — Encounter: Payer: Medicare HMO | Admitting: Student

## 2021-04-25 ENCOUNTER — Encounter: Payer: Self-pay | Admitting: Pharmacist

## 2021-05-23 ENCOUNTER — Other Ambulatory Visit: Payer: Self-pay | Admitting: Student

## 2021-05-23 DIAGNOSIS — I10 Essential (primary) hypertension: Secondary | ICD-10-CM

## 2021-05-25 ENCOUNTER — Other Ambulatory Visit: Payer: Medicare HMO

## 2021-06-19 ENCOUNTER — Telehealth: Payer: Self-pay | Admitting: Student

## 2021-06-19 DIAGNOSIS — I10 Essential (primary) hypertension: Secondary | ICD-10-CM

## 2021-06-22 NOTE — Telephone Encounter (Signed)
Request ? ? ?olmesartan (BENICAR) 20 MG tablet ? ?[place], [place] (Ph: [phone]) ?

## 2021-06-23 NOTE — Telephone Encounter (Signed)
Patient sent message via my chart to contact our office to set up an appointment in the next 30 days. ?

## 2021-07-15 ENCOUNTER — Ambulatory Visit (HOSPITAL_COMMUNITY)
Admission: RE | Admit: 2021-07-15 | Discharge: 2021-07-15 | Disposition: A | Discharge status: Home / Self Care | Payer: Medicare HMO | Source: Ambulatory Visit | Attending: Internal Medicine | Admitting: Internal Medicine

## 2021-07-15 ENCOUNTER — Ambulatory Visit (INDEPENDENT_AMBULATORY_CARE_PROVIDER_SITE_OTHER): Payer: Medicare HMO | Admitting: Internal Medicine

## 2021-07-15 ENCOUNTER — Encounter: Payer: Self-pay | Admitting: Internal Medicine

## 2021-07-15 ENCOUNTER — Other Ambulatory Visit: Payer: Self-pay

## 2021-07-15 VITALS — BP 165/78 | HR 68 | Temp 97.7°F | Ht 62.0 in | Wt 134.2 lb

## 2021-07-15 DIAGNOSIS — E876 Hypokalemia: Secondary | ICD-10-CM

## 2021-07-15 DIAGNOSIS — E878 Other disorders of electrolyte and fluid balance, not elsewhere classified: Secondary | ICD-10-CM | POA: Diagnosis not present

## 2021-07-15 DIAGNOSIS — I10 Essential (primary) hypertension: Secondary | ICD-10-CM

## 2021-07-15 DIAGNOSIS — N184 Chronic kidney disease, stage 4 (severe): Secondary | ICD-10-CM

## 2021-07-15 DIAGNOSIS — E049 Nontoxic goiter, unspecified: Secondary | ICD-10-CM | POA: Diagnosis not present

## 2021-07-15 DIAGNOSIS — I129 Hypertensive chronic kidney disease with stage 1 through stage 4 chronic kidney disease, or unspecified chronic kidney disease: Secondary | ICD-10-CM | POA: Diagnosis not present

## 2021-07-15 DIAGNOSIS — E041 Nontoxic single thyroid nodule: Secondary | ICD-10-CM | POA: Diagnosis not present

## 2021-07-15 MED ORDER — OLMESARTAN MEDOXOMIL 40 MG PO TABS: 40.0000 mg | ORAL_TABLET | Freq: Every day | ORAL | 2 refills | Status: DC

## 2021-07-15 NOTE — Assessment & Plan Note (Signed)
BP 190/74, and repeat 165/78. Reports home readings averaging 150's/80's. Good medication compliance to amlodipine 10 mg, olmesartan 20 mg, and Coreg 6.25 BID. No adverse effects to medications. She is asymptomatic, and reports feeling well. Given persistently elevated BP, will proceed with up-titration of Olmesartan 20 mg to 40 mg daily, and get BMET today. She has stable CKD. We will follow up in 2 weeks for BP home readings. ?

## 2021-07-15 NOTE — Assessment & Plan Note (Addendum)
Patient with history of chronic hypokalemia. Unknown cause of her hypokalemia at this time. Aldo/renin was normal. She denies GI losses such as diarrhea or vomiting. She reports eating a balanced diet with adequate vegetables.She is not on any diuretics. Low suspicion for RTA with the lack of acidosis. Also low suspicion for Bartter or Liddleman with no alkalosis. 04/01/21 BMP with stable CKD and K of 3 that improved to 3.4 with 1 week of potassium 20 mEq packets. Calculated fraction excretion of potassium came back as 11.8, suggesting renal causes. No evidence of RTA on BMP. She is not taking a diuretic. She was given 14 more days of K packets and instructed to follow in 1 week, but unfortunately was unable to. She reports feeling well and has no complaints today. We will follow up with a BMET today, and replete K if needed. We will follow up in 2 weeks.  ? ?Addendum: ?K 4.0.  ?Patient also noted to have mild metabolic acidosis possibly from underlying CKD. Will need follow up with repeat BMP in 2 weeks during follow up. Also, given GFR <30 and CKD 4 at this time, we will place nephrology referral at this time to establish care.  ?

## 2021-07-15 NOTE — Progress Notes (Signed)
? ?CC: routine clinic follow up  ? ?HPI: ? ?Ms.Shakeira Rhee is a 76 y.o. female with a PMHx stated below and presents today for stated above. Please see the Encounters tab for problem-based Assessment & Plan for additional details.  ? ?Past Medical History:  ?Diagnosis Date  ? Anxiety   ? Arthritis   ? Diverticula, colon 1999  ? Heart murmur   ? History of blood transfusion   ? Hyperlipidemia   ? Hypertension   ? Osteoarthritis of knee   ? right  ? PONV (postoperative nausea and vomiting)   ? ? ?Current Outpatient Medications on File Prior to Visit  ?Medication Sig Dispense Refill  ? ALPRAZolam (XANAX) 0.5 MG tablet Take 1 tablet (0.5 mg total) at bedtime as needed by mouth for anxiety or sleep. 30 tablet 1  ? amLODipine (NORVASC) 10 MG tablet Take 1 tablet by mouth once daily 90 tablet 2  ? aspirin 81 MG EC tablet TAKE 1 TABLET (81 MG TOTAL) BY MOUTH DAILY. SWALLOW WHOLE. 30 tablet 0  ? atorvastatin (LIPITOR) 40 MG tablet Take 1 tablet by mouth once daily 90 tablet 2  ? carvedilol (COREG) 6.25 MG tablet Take 1 tablet (6.25 mg total) by mouth 2 (two) times daily. 60 tablet 11  ? potassium chloride (KLOR-CON) 20 MEQ packet Take 20 mEq by mouth daily for 14 days. 14 packet 0  ? [DISCONTINUED] lisinopril-hydrochlorothiazide (PRINZIDE,ZESTORETIC) 20-25 MG per tablet Take 1 tablet by mouth 2 (two) times daily. Take 1 by mouth daily    ? ?No current facility-administered medications on file prior to visit.  ? ? ?Family History  ?Problem Relation Age of Onset  ? Hypertension Son   ? Hypertension Other   ? Arthritis Adoptive Father   ? Arthritis Adoptive Mother   ? ? ?Social History  ? ?Socioeconomic History  ? Marital status: Widowed  ?  Spouse name: Not on file  ? Number of children: Not on file  ? Years of education: Not on file  ? Highest education level: Not on file  ?Occupational History  ? Not on file  ?Tobacco Use  ? Smoking status: Former  ? Smokeless tobacco: Never  ?Vaping Use  ? Vaping Use: Never used   ?Substance and Sexual Activity  ? Alcohol use: No  ?  Alcohol/week: 0.0 standard drinks  ? Drug use: No  ? Sexual activity: Not on file  ?Other Topics Concern  ? Not on file  ?Social History Narrative  ? Widowed, lives alone with 2 dogs  ?   ? Current Social History 03/02/2021    ?   ? Patient lives with grandson in a home which is 2 stories. There are not steps up to the entrance the patient uses.   ?   ? Patient's method of transportation is via family member.  ?   ? The highest level of education was high school diploma.  ?   ? The patient currently retired.  ?   ? Identified important Relationships are "my children"   ?   ? Pets : 1 dog  ?    ? Interests / Fun: "I weed, sometimes I paint, I watch TV, I take care of my home"   ?   ? Current Stressors: "none"   ?   ? Religious / Personal Beliefs: "Catholic"   ?   ? ?Social Determinants of Health  ? ?Financial Resource Strain: Not on file  ?Food Insecurity: Not on file  ?Transportation Needs:  Not on file  ?Physical Activity: Not on file  ?Stress: Not on file  ?Social Connections: Not on file  ?Intimate Partner Violence: Not on file  ? ? ?Review of Systems: ?ROS negative except for what is noted on the assessment and plan. ? ?Vitals:  ? 07/15/21 1000 07/15/21 1042  ?BP: (!) 190/74 (!) 165/78  ?Pulse: 68 68  ?Temp: 97.7 ?F (36.5 ?C)   ?TempSrc: Oral   ?SpO2: 100%   ?Weight: 134 lb 3.2 oz (60.9 kg)   ?Height: 5\' 2"  (1.575 m)   ? ? ?Physical Exam: ?Constitutional: alert, well-appearing, in NAD ?HENT: normocephalic, atraumatic, mucous membranes moist ?Eyes: conjunctiva non-erythematous, EOMI ?Neck: large right thyroid nodule that can be visualized. Diameter is approximately 5-6 cm, stable compared to prior visit. The surface is smooth and nontender to palpation- stable compared to prior visit.  ?Cardiovascular: RRR, no m/r/g, non-edematous bilateral LE ?Pulmonary/Chest: normal work of breathing on RA, LCTAB ?Abdominal: soft, non-tender to palpation, non-distended ?MSK:  normal bulk and tone  ?Neurological: A&O x 3 and follows commands  ? ?Assessment & Plan:  ? ?See Encounters Tab for problem based charting. ? ?Patient discussed with Dr. Dareen Piano ? ?Lajean Manes, MD  ?Internal Medicine Resident, PGY-1 ?[facility]  ?

## 2021-07-15 NOTE — Patient Instructions (Signed)
Please take the medications that you were taking EXCEPT increase Olmesartan from 20 mg to 40 mg.  ? ?Please get your Thyroid ultrasound  ? ?I will call you with your lab results. ? ?See you in 2 weeks  ?

## 2021-07-15 NOTE — Assessment & Plan Note (Signed)
Prior workup in 2021 negative including [place] and biopsy. Given increase in size during 12/8 OV, repeat TSH obtained and order for [place] placed. TSH was wnl. Thyroid US is schedule for today. Exam as documented under exam. She is asymptomatic at this time. We will follow up on results during 2 week follow up.  ?

## 2021-07-16 ENCOUNTER — Ambulatory Visit: Payer: Medicare HMO | Admitting: Adult Health

## 2021-07-16 ENCOUNTER — Other Ambulatory Visit: Payer: Self-pay | Admitting: Student

## 2021-07-16 DIAGNOSIS — E041 Nontoxic single thyroid nodule: Secondary | ICD-10-CM

## 2021-07-16 LAB — BMP8+ANION GAP
Anion Gap: 19 mmol/L — ABNORMAL HIGH (ref 10.0–18.0)
BUN/Creatinine Ratio: 19 (ref 12–28)
BUN: 35 mg/dL — ABNORMAL HIGH (ref 8–27)
CO2: 18 mmol/L — ABNORMAL LOW (ref 20–29)
Calcium: 9.9 mg/dL (ref 8.7–10.3)
Chloride: 103 mmol/L (ref 96–106)
Creatinine, Ser: 1.88 mg/dL — ABNORMAL HIGH (ref 0.57–1.00)
Glucose: 108 mg/dL — ABNORMAL HIGH (ref 70–99)
Potassium: 4 mmol/L (ref 3.5–5.2)
Sodium: 140 mmol/L (ref 134–144)
eGFR: 27 mL/min/{1.73_m2} — ABNORMAL LOW (ref >59)

## 2021-07-16 NOTE — Addendum Note (Signed)
Addended byLajean Manes on: 07/16/2021 03:40 PM ? ? Modules accepted: Orders ? ?

## 2021-07-16 NOTE — Addendum Note (Signed)
Addended byLajean Manes on: 07/16/2021 03:45 PM ? ? Modules accepted: Level of Service ? ?

## 2021-07-16 NOTE — Progress Notes (Signed)
Internal Medicine Clinic Attending ? ?Case discussed with Dr. Posey Pronto  At the time of the visit.  We reviewed the resident?s history and exam and pertinent patient test results.  I agree with the assessment, diagnosis, and plan of care documented in the resident?s note.\ ? ?Patient also noted to have mild metabolic acidosis possibly from underlying CKD. Will need follow up with repeat BMP in 2 weeks ?

## 2021-07-16 NOTE — Progress Notes (Signed)
I called and spoke with patient on the phone.  I explained to patient that her right thyroid nodule has gotten significantly enlarged compared to 2021.  Even though her prior biopsy came back as benign, we will go ahead and order another biopsy to make sure it is not cancerous.  Patient agrees with the plan. ?

## 2021-07-20 ENCOUNTER — Other Ambulatory Visit (HOSPITAL_COMMUNITY): Payer: Medicare HMO

## 2021-07-29 ENCOUNTER — Ambulatory Visit (HOSPITAL_COMMUNITY): Admission: RE | Admit: 2021-07-29 | Payer: Medicare HMO | Source: Ambulatory Visit

## 2021-07-29 ENCOUNTER — Ambulatory Visit (INDEPENDENT_AMBULATORY_CARE_PROVIDER_SITE_OTHER): Payer: Medicare HMO | Admitting: Student

## 2021-07-29 ENCOUNTER — Encounter: Payer: Self-pay | Admitting: Student

## 2021-07-29 DIAGNOSIS — I129 Hypertensive chronic kidney disease with stage 1 through stage 4 chronic kidney disease, or unspecified chronic kidney disease: Secondary | ICD-10-CM

## 2021-07-29 DIAGNOSIS — N184 Chronic kidney disease, stage 4 (severe): Secondary | ICD-10-CM | POA: Diagnosis not present

## 2021-07-29 DIAGNOSIS — E041 Nontoxic single thyroid nodule: Secondary | ICD-10-CM

## 2021-07-29 DIAGNOSIS — I10 Essential (primary) hypertension: Secondary | ICD-10-CM

## 2021-07-29 NOTE — Assessment & Plan Note (Signed)
Patient has a thyroid biopsy scheduled today but unfortunately she does not have transportation.  The biopsy is rescheduled for 08/12/2021. ?

## 2021-07-29 NOTE — Progress Notes (Signed)
?  [facility] Telephone Encounter ?Continuity Care Appointment ? ?HPI:  ?This telephone encounter was created for [name] on 07/29/2021 for the following purpose/cc BP follow up. ?  ? ?Past Medical History:  ?Past Medical History:  ?Diagnosis Date  ? Anxiety   ? Arthritis   ? Diverticula, colon 1999  ? Heart murmur   ? History of blood transfusion   ? Hyperlipidemia   ? Hypertension   ? Osteoarthritis of knee   ? right  ? PONV (postoperative nausea and vomiting)   ?  ? ?ROS:  ?Per HPI  ? ?Assessment / Plan / Recommendations:  ?Please see A&P under problem oriented charting for assessment of the patient's acute and chronic medical conditions.  ?As always, pt is advised that if symptoms worsen or new symptoms arise, they should go to an urgent care facility or to to ER for further evaluation.  ? ?Consent and Medical Decision Making:  ?Patient discussed with Dr. Jimmye Norman ?This is a telephone encounter between Donna Lawson and Gaylan Gerold on 07/29/2021 for BP follow up. The visit was conducted with the patient located at home and Gaylan Gerold at [facility]. The patient's identity was confirmed using their DOB and current address. The patient has consented to being evaluated through a telephone encounter and understands the associated risks (an examination cannot be done and the patient may need to come in for an appointment) / benefits (allows the patient to remain at home, decreasing exposure to coronavirus). I personally spent 8 minutes on medical discussion.   ?  ?

## 2021-07-29 NOTE — Assessment & Plan Note (Signed)
Blood pressure was elevated at last visit.  Her olmesartan was increased from 20 to 40 mg.  She report adherence to medications including amlodipine and Coreg as well.  Patient does not check her blood pressure at home. ? ?Patient could not come in to the office today due to the lack of transportation.  She can only come to the office on 4/26. ? ?-Continue amlodipine, Coreg and olmesartan. ?-Patient is advised to bring her blood pressure cuff with her to next visit.   ?-We will need to monitor kidney function closely while she is on ARB with CKD 4. ?

## 2021-07-29 NOTE — Progress Notes (Signed)
Internal Medicine Clinic Attending  Case discussed with Dr. Nguyen  At the time of the visit.  We reviewed the resident's history and exam and pertinent patient test results.  I agree with the assessment, diagnosis, and plan of care documented in the resident's note. 

## 2021-07-29 NOTE — Assessment & Plan Note (Signed)
Last BMP showed GFR of 27 and metabolic acidosis likely due to her CKD.  Patient unfortunately could not come in due to lack of transportation. ? ?- Will repeat BMP on 4/26 ?- Nephrology referral was placed at last visit ?- Monitor her kidney function closely while she is on an ARB.  No indication for SGLT2 given advanced CKD ?

## 2021-08-12 ENCOUNTER — Other Ambulatory Visit: Payer: Self-pay

## 2021-08-12 ENCOUNTER — Ambulatory Visit (HOSPITAL_COMMUNITY)
Admission: RE | Admit: 2021-08-12 | Discharge: 2021-08-12 | Disposition: A | Discharge status: Home / Self Care | Payer: Medicare HMO | Source: Ambulatory Visit | Attending: Internal Medicine | Admitting: Internal Medicine

## 2021-08-12 ENCOUNTER — Encounter: Payer: Self-pay | Admitting: Student

## 2021-08-12 ENCOUNTER — Ambulatory Visit (INDEPENDENT_AMBULATORY_CARE_PROVIDER_SITE_OTHER): Payer: Medicare HMO | Admitting: Student

## 2021-08-12 VITALS — BP 160/68 | HR 59 | Temp 98.1°F | Ht 62.0 in | Wt 135.1 lb

## 2021-08-12 DIAGNOSIS — I129 Hypertensive chronic kidney disease with stage 1 through stage 4 chronic kidney disease, or unspecified chronic kidney disease: Secondary | ICD-10-CM

## 2021-08-12 DIAGNOSIS — Z0000 Encounter for general adult medical examination without abnormal findings: Secondary | ICD-10-CM

## 2021-08-12 DIAGNOSIS — N184 Chronic kidney disease, stage 4 (severe): Secondary | ICD-10-CM | POA: Diagnosis not present

## 2021-08-12 DIAGNOSIS — E041 Nontoxic single thyroid nodule: Secondary | ICD-10-CM | POA: Diagnosis not present

## 2021-08-12 DIAGNOSIS — E876 Hypokalemia: Secondary | ICD-10-CM | POA: Diagnosis not present

## 2021-08-12 DIAGNOSIS — I10 Essential (primary) hypertension: Secondary | ICD-10-CM

## 2021-08-12 MED ORDER — HYDRALAZINE HCL 10 MG PO TABS: 10.0000 mg | ORAL_TABLET | Freq: Two times a day (BID) | ORAL | 2 refills | Status: DC

## 2021-08-12 MED ORDER — OLMESARTAN MEDOXOMIL 20 MG PO TABS: 20.0000 mg | ORAL_TABLET | Freq: Every day | ORAL | 2 refills | Status: AC

## 2021-08-12 MED ORDER — LIDOCAINE HCL (PF) 1 % IJ SOLN
INTRAMUSCULAR | Status: AC
  Filled 2021-08-12: qty 30

## 2021-08-12 NOTE — Assessment & Plan Note (Addendum)
Obtaining renal ultrasound and renal artery stenosis ?Advised patient to call and make an appointment with [facility] as soon as possible ?Please see Hypertension tab for more details ?

## 2021-08-12 NOTE — Addendum Note (Signed)
Addended by: Truddie Crumble on: 08/12/2021 11:13 AM ? ? Modules accepted: Orders ? ?

## 2021-08-12 NOTE — Assessment & Plan Note (Addendum)
Blood pressure remains elevated today at 176/67.  Repeat 165/57.  Report adherence to medication. ?Currently taking amlodipine 10 mg, Coreg 6.25 mg BID and olmesartan 40 mg daily. ?Last BMP showed GFR of 27 with a AGMA.  Potassium value was normal. ? ?I am suspecting her persistent hypertension is secondary to CKD 4.  Aldo/renin ratio done in 2022 not consistent with hyperaldosteronism.  Stop bang score of 2, low risk for sleep apnea.  She endorses a low-salt diet.  Her BMI was within normal limit. ? ?-Will obtain renal ultrasound and renal artery stenosis ?-Given his CKD 4, will reduce olmesartan to 20 mg daily ?-Continue amlodipine and Coreg.  Cannot increase Coreg dose due to pulse in the 60s. ?-Add hydralazine 10 mg twice daily.  ?-Advised patient to call [facility] to make an appointment as soon as possible ? ?Addendum ?Unable to obtain BMP after 3 attempts ?Hopefully she can get the blood work when she sees nephrology. Otherwise we can get it at next visit.  ?

## 2021-08-12 NOTE — Assessment & Plan Note (Signed)
She is due for DEXA scan but will hold off for now.  She has a lot going on with thyroid biopsy and renal ultrasound. ?

## 2021-08-12 NOTE — Patient Instructions (Signed)
Donna Lawson, ? ?It was nice seeing you in the clinic today. ? ?Your blood pressure remains high.  I am thinking this is due to the reduced kidney functions. ? ?I will decrease the dose of the olmesartan from 40 to 20 mg daily. ? ?I added hydralazine 10 mg twice daily. ? ?Please continue taking carvedilol and amlodipine. ? ?I will also obtain an ultrasound of your kidneys. ? ?Please call and make an appointment with your kidney doctor soon as possible. ? ?Please return in 4 weeks ? ?Take care ? ?Dr. Alfonse Spruce ?

## 2021-08-12 NOTE — Procedures (Signed)
PROCEDURE SUMMARY: ? ?Using direct ultrasound guidance, 5 passes were made using 25 g needles ?into the nodule within the right lobe of the thyroid.  ? ?Ultrasound was used to confirm needle placements on all occasions.  ? ?EBL = trace ? ?Specimens were sent to Pathology for analysis. ? ?See procedure note under Imaging tab in Epic for full procedure details. ? ?Murrell Redden PA-C ?08/12/2021 ?12:53 PM ? ? ? ?

## 2021-08-12 NOTE — Progress Notes (Addendum)
? ?CC: 4 weeks follow-up for blood pressure recheck ? ?HPI: ? ?[name] is a 76 y.o. with past medical history of hypertension, CKD 4, CVA who presents to the clinic today for blood pressure recheck. ? ?Please see problem based charting for detail ? ?Past Medical History:  ?Diagnosis Date  ? Anxiety   ? Arthritis   ? Diverticula, colon 1999  ? Heart murmur   ? History of blood transfusion   ? Hyperlipidemia   ? Hypertension   ? Osteoarthritis of knee   ? right  ? PONV (postoperative nausea and vomiting)   ? ?Review of Systems: Per HPI ? ?Physical Exam: ? ?Vitals:  ? 08/12/21 0940 08/12/21 0949 08/12/21 1005  ?BP: (!) 176/67 (!) 165/57 (!) 160/68  ?Pulse: 61 60 (!) 59  ?Temp: 98.1 ?F (36.7 ?C)    ?TempSrc: Oral    ?SpO2: 100%    ?Weight: 135 lb 1.6 oz (61.3 kg)    ?Height: 5\' 2"  (1.575 m)    ? ?Physical Exam ?Constitutional:   ?   General: She is not in acute distress. ?   Appearance: She is not ill-appearing.  ?HENT:  ?   Head: Normocephalic.  ?Eyes:  ?   General:     ?   Right eye: No discharge.     ?   Left eye: No discharge.  ?   Conjunctiva/sclera: Conjunctivae normal.  ?Cardiovascular:  ?   Rate and Rhythm: Normal rate and regular rhythm.  ?   Pulses: Normal pulses.  ?   Heart sounds: Normal heart sounds.  ?Pulmonary:  ?   Effort: Pulmonary effort is normal. No respiratory distress.  ?   Breath sounds: Normal breath sounds. No wheezing.  ?Musculoskeletal:     ?   General: Normal range of motion.  ?Skin: ?   General: Skin is warm.  ?Neurological:  ?   General: No focal deficit present.  ?   Mental Status: She is alert.  ?Psychiatric:     ?   Mood and Affect: Mood normal.  ?  ? ?Assessment & Plan:  ? ?See Encounters Tab for problem based charting. ?Hypertension ?Blood pressure remains elevated today at 176/67.  Repeat 165/57.  Report adherence to medication. ?Currently taking amlodipine 10 mg, Coreg 6.25 mg BID and olmesartan 40 mg daily. ?Last BMP showed GFR of 27 with a AGMA.  Potassium value was  normal. ? ?I am suspecting her persistent hypertension is secondary to CKD 4.  Aldo/renin ratio done in 2022 not consistent with hyperaldosteronism.  Stop bang score of 2, low risk for sleep apnea.  She endorses a low-salt diet.  Her BMI was within normal limit. ? ?-Will obtain renal ultrasound and renal artery stenosis ?-Given his CKD 4, will reduce olmesartan to 20 mg daily ?-Continue amlodipine and Coreg.  Cannot increase Coreg dose due to pulse in the 60s. ?-Add hydralazine 10 mg twice daily.  ?-Advised patient to call [facility] to make an appointment as soon as possible ? ?Addendum ?Unable to obtain BMP after 3 attempts ?Hopefully she can get the blood work when she sees nephrology. Otherwise we can get it at next visit.  ? ?Hypokalemia ?- Repeat BMP today ? ?CKD (chronic kidney disease) stage 4, GFR 15-29 ml/min (HCC) ?Obtaining renal ultrasound and renal artery stenosis ?Advised patient to call and make an appointment with [facility] as soon as possible ?Please see Hypertension tab for more details ? ?Healthcare maintenance ?She is due for DEXA scan  but will hold off for now.  She has a lot going on with thyroid biopsy and renal ultrasound.  ? ?Patient discussed with Dr. Jimmye Norman  ?

## 2021-08-12 NOTE — Assessment & Plan Note (Signed)
Repeat BMP today. 

## 2021-08-13 LAB — CYTOLOGY - NON PAP

## 2021-08-13 NOTE — Progress Notes (Signed)
Internal Medicine Clinic Attending  Case discussed with Dr. Nguyen  At the time of the visit.  We reviewed the resident's history and exam and pertinent patient test results.  I agree with the assessment, diagnosis, and plan of care documented in the resident's note. 

## 2021-08-31 DIAGNOSIS — N184 Chronic kidney disease, stage 4 (severe): Secondary | ICD-10-CM | POA: Diagnosis not present

## 2021-08-31 DIAGNOSIS — H209 Unspecified iridocyclitis: Secondary | ICD-10-CM | POA: Diagnosis not present

## 2021-08-31 DIAGNOSIS — E876 Hypokalemia: Secondary | ICD-10-CM | POA: Diagnosis not present

## 2021-08-31 DIAGNOSIS — I129 Hypertensive chronic kidney disease with stage 1 through stage 4 chronic kidney disease, or unspecified chronic kidney disease: Secondary | ICD-10-CM | POA: Diagnosis not present

## 2021-09-01 ENCOUNTER — Other Ambulatory Visit: Payer: Self-pay | Admitting: Nephrology

## 2021-09-01 DIAGNOSIS — I129 Hypertensive chronic kidney disease with stage 1 through stage 4 chronic kidney disease, or unspecified chronic kidney disease: Secondary | ICD-10-CM

## 2021-09-01 DIAGNOSIS — E876 Hypokalemia: Secondary | ICD-10-CM

## 2021-09-01 DIAGNOSIS — N184 Chronic kidney disease, stage 4 (severe): Secondary | ICD-10-CM

## 2021-09-28 ENCOUNTER — Telehealth: Payer: Self-pay | Admitting: Student

## 2021-09-28 NOTE — Telephone Encounter (Signed)
RTC to patient just has questions to ask about her kidney doctor appointment and ask about a biopsy that she had done.

## 2021-09-28 NOTE — Telephone Encounter (Signed)
Pt is requesting a call back about her US Renal that she Cancelled.  Pt states Dr. Madelon Lips had orders these test as well and wants to know why we ordered them.

## 2021-09-30 ENCOUNTER — Other Ambulatory Visit: Payer: Medicare HMO

## 2021-10-06 NOTE — Telephone Encounter (Signed)
Patent called would like to talk with her doctor.

## 2021-10-08 ENCOUNTER — Other Ambulatory Visit (HOSPITAL_COMMUNITY): Payer: Medicare HMO

## 2021-10-14 ENCOUNTER — Ambulatory Visit
Admission: RE | Admit: 2021-10-14 | Discharge: 2021-10-14 | Disposition: A | Discharge status: Home / Self Care | Payer: Medicare HMO | Source: Ambulatory Visit | Attending: Nephrology | Admitting: Nephrology

## 2021-10-14 DIAGNOSIS — N184 Chronic kidney disease, stage 4 (severe): Secondary | ICD-10-CM | POA: Diagnosis not present

## 2021-10-14 DIAGNOSIS — I129 Hypertensive chronic kidney disease with stage 1 through stage 4 chronic kidney disease, or unspecified chronic kidney disease: Secondary | ICD-10-CM

## 2021-10-14 DIAGNOSIS — E876 Hypokalemia: Secondary | ICD-10-CM

## 2021-10-14 DIAGNOSIS — N281 Cyst of kidney, acquired: Secondary | ICD-10-CM | POA: Diagnosis not present

## 2021-10-28 DIAGNOSIS — E876 Hypokalemia: Secondary | ICD-10-CM | POA: Diagnosis not present

## 2021-10-28 DIAGNOSIS — H209 Unspecified iridocyclitis: Secondary | ICD-10-CM | POA: Diagnosis not present

## 2021-10-28 DIAGNOSIS — N184 Chronic kidney disease, stage 4 (severe): Secondary | ICD-10-CM | POA: Diagnosis not present

## 2021-10-28 DIAGNOSIS — I129 Hypertensive chronic kidney disease with stage 1 through stage 4 chronic kidney disease, or unspecified chronic kidney disease: Secondary | ICD-10-CM | POA: Diagnosis not present

## 2021-11-18 ENCOUNTER — Other Ambulatory Visit: Payer: Self-pay | Admitting: Student

## 2021-11-18 DIAGNOSIS — N184 Chronic kidney disease, stage 4 (severe): Secondary | ICD-10-CM | POA: Diagnosis not present

## 2021-11-18 DIAGNOSIS — I10 Essential (primary) hypertension: Secondary | ICD-10-CM

## 2021-11-28 ENCOUNTER — Other Ambulatory Visit: Payer: Self-pay | Admitting: Student

## 2022-03-02 ENCOUNTER — Other Ambulatory Visit: Payer: Self-pay | Admitting: Student

## 2022-03-02 DIAGNOSIS — I10 Essential (primary) hypertension: Secondary | ICD-10-CM

## 2022-04-27 ENCOUNTER — Other Ambulatory Visit: Payer: Self-pay | Admitting: Internal Medicine

## 2022-04-27 DIAGNOSIS — I10 Essential (primary) hypertension: Secondary | ICD-10-CM

## 2022-05-26 ENCOUNTER — Other Ambulatory Visit: Payer: Self-pay | Admitting: Student

## 2022-05-26 DIAGNOSIS — I10 Essential (primary) hypertension: Secondary | ICD-10-CM

## 2022-07-06 ENCOUNTER — Encounter: Payer: Medicare HMO | Admitting: Internal Medicine

## 2022-07-06 ENCOUNTER — Ambulatory Visit: Payer: Medicare HMO

## 2022-07-24 ENCOUNTER — Other Ambulatory Visit: Payer: Self-pay | Admitting: Student

## 2022-07-24 DIAGNOSIS — I10 Essential (primary) hypertension: Secondary | ICD-10-CM

## 2022-07-26 DIAGNOSIS — N2581 Secondary hyperparathyroidism of renal origin: Secondary | ICD-10-CM | POA: Diagnosis not present

## 2022-07-26 DIAGNOSIS — R809 Proteinuria, unspecified: Secondary | ICD-10-CM | POA: Diagnosis not present

## 2022-07-26 DIAGNOSIS — I129 Hypertensive chronic kidney disease with stage 1 through stage 4 chronic kidney disease, or unspecified chronic kidney disease: Secondary | ICD-10-CM | POA: Diagnosis not present

## 2022-07-26 DIAGNOSIS — E049 Nontoxic goiter, unspecified: Secondary | ICD-10-CM | POA: Diagnosis not present

## 2022-07-26 DIAGNOSIS — N189 Chronic kidney disease, unspecified: Secondary | ICD-10-CM | POA: Diagnosis not present

## 2022-07-26 DIAGNOSIS — E876 Hypokalemia: Secondary | ICD-10-CM | POA: Diagnosis not present

## 2022-07-26 DIAGNOSIS — E785 Hyperlipidemia, unspecified: Secondary | ICD-10-CM | POA: Diagnosis not present

## 2022-07-26 DIAGNOSIS — D631 Anemia in chronic kidney disease: Secondary | ICD-10-CM | POA: Diagnosis not present

## 2022-07-26 DIAGNOSIS — N184 Chronic kidney disease, stage 4 (severe): Secondary | ICD-10-CM | POA: Diagnosis not present

## 2022-07-27 LAB — LAB REPORT - SCANNED
Albumin, Urine POC: 324
Albumin/Creatinine Ratio, Urine, POC: 2817

## 2022-08-02 ENCOUNTER — Other Ambulatory Visit: Payer: Self-pay | Admitting: Student

## 2022-08-02 ENCOUNTER — Encounter: Payer: Medicare HMO | Admitting: Student

## 2022-08-02 DIAGNOSIS — I10 Essential (primary) hypertension: Secondary | ICD-10-CM

## 2022-08-09 ENCOUNTER — Ambulatory Visit (INDEPENDENT_AMBULATORY_CARE_PROVIDER_SITE_OTHER): Payer: Medicare HMO

## 2022-08-09 ENCOUNTER — Encounter: Payer: Self-pay | Admitting: Internal Medicine

## 2022-08-09 ENCOUNTER — Other Ambulatory Visit: Payer: Self-pay

## 2022-08-09 ENCOUNTER — Ambulatory Visit (INDEPENDENT_AMBULATORY_CARE_PROVIDER_SITE_OTHER): Payer: Medicare HMO | Admitting: Internal Medicine

## 2022-08-09 VITALS — BP 183/77 | HR 78 | Ht 62.0 in | Wt 135.4 lb

## 2022-08-09 VITALS — BP 183/77 | HR 78 | Temp 97.7°F | Ht 62.0 in | Wt 135.9 lb

## 2022-08-09 DIAGNOSIS — I129 Hypertensive chronic kidney disease with stage 1 through stage 4 chronic kidney disease, or unspecified chronic kidney disease: Secondary | ICD-10-CM

## 2022-08-09 DIAGNOSIS — E559 Vitamin D deficiency, unspecified: Secondary | ICD-10-CM | POA: Diagnosis not present

## 2022-08-09 DIAGNOSIS — E041 Nontoxic single thyroid nodule: Secondary | ICD-10-CM

## 2022-08-09 DIAGNOSIS — E785 Hyperlipidemia, unspecified: Secondary | ICD-10-CM

## 2022-08-09 DIAGNOSIS — Z0000 Encounter for general adult medical examination without abnormal findings: Secondary | ICD-10-CM

## 2022-08-09 DIAGNOSIS — I10 Essential (primary) hypertension: Secondary | ICD-10-CM

## 2022-08-09 DIAGNOSIS — N184 Chronic kidney disease, stage 4 (severe): Secondary | ICD-10-CM

## 2022-08-09 MED ORDER — CARVEDILOL 12.5 MG PO TABS: 6.2500 mg | ORAL_TABLET | Freq: Two times a day (BID) | ORAL | 0 refills | Status: AC

## 2022-08-09 MED ORDER — HYDRALAZINE HCL 10 MG PO TABS: 10.0000 mg | ORAL_TABLET | Freq: Three times a day (TID) | ORAL | 0 refills | Status: DC

## 2022-08-09 NOTE — Progress Notes (Unsigned)
   CC: follow up  HPI:  [name] is a 77 y.o. with medical history of HTN, DMII, CKDIV, carotid artery stenosis, retinal embolism presenting to [facility] for a follow up exam.   Please see problem-based list for further details, assessments, and plans.  Past Medical History:  Diagnosis Date   Anxiety    Arthritis    Diverticula, colon 1999   Heart murmur    History of blood transfusion    Hyperlipidemia    Hypertension    Osteoarthritis of knee    right   PONV (postoperative nausea and vomiting)      Current Outpatient Medications (Cardiovascular):    amLODipine (NORVASC) 10 MG tablet, Take 1 tablet by mouth once daily   atorvastatin (LIPITOR) 40 MG tablet, Take 1 tablet by mouth once daily   carvedilol (COREG) 6.25 MG tablet, Take 1 tablet by mouth twice daily   hydrALAZINE (APRESOLINE) 10 MG tablet, Take 1 tablet by mouth twice daily   olmesartan (BENICAR) 20 MG tablet, Take 1 tablet (20 mg total) by mouth daily.   Current Outpatient Medications (Analgesics):    aspirin 81 MG EC tablet, TAKE 1 TABLET (81 MG TOTAL) BY MOUTH DAILY. SWALLOW WHOLE.   Current Outpatient Medications (Other):    bismuth subsalicylate (PEPTO BISMOL) 262 MG/15ML suspension, Take 30 mLs by mouth every 6 (six) hours as needed for indigestion.  Review of Systems:  Review of system negative unless stated in the problem list or HPI.    Physical Exam:  Vitals:   08/09/22 0945 08/09/22 0949  BP: (!) 193/60 (!) 183/77  Pulse: 79 78  Temp: 97.7 F (36.5 C)   TempSrc: Oral   SpO2: 98%   Weight: 135 lb 14.4 oz (61.6 kg)   Height:  (1.575 m)    Physical Exam General: NAD HENT: NCAT Lungs: CTAB, no wheeze, rhonchi or rales.  Cardiovascular: Normal heart sounds, no r/m/g, 2+ pulses in all extremities. No LE edema Abdomen: No TTP, normal bowel sounds MSK: No asymmetry or muscle atrophy.  Skin: no lesions noted on exposed skin Neuro: Alert and oriented x4. CN grossly intact Psych:  Normal mood and normal affect   Assessment & Plan:   No problem-specific Assessment & Plan notes found for this encounter.   See Encounters Tab for problem based charting.  Patient Discussed with Dr. {NAMES:[id number]::"Guilloud","Hoffman","Mullen","Narendra","Vincent","Machen","Lau","Hatcher","Williams"} Gwenevere Abbot, MD Eligha Bridegroom. [facility], PGY-2   HTN/CKDIV 193/60. On Hydralazine 10 mg BID, Amlodipine 10 mg qd, Coreg 6.25 mg BID, Olmesartan 20 mg.  Repeat BMP. Last follow up with nephrology was last week and pt states everything was good. BP was 132/80. States BP runs around 140s-150s/90s. States blood work was done and was good. Reccommended 6 month follow up.   Renal ultrasound done and showed medical renal disease. No RAS.   Thyroid nodule: biopsy done. Unable to find results but states they told her it was normal.  Lipid panel this visit. BMP?, hep C, vitamin d

## 2022-08-09 NOTE — Patient Instructions (Signed)

## 2022-08-09 NOTE — Assessment & Plan Note (Signed)
Pt biopsy result without malignant findings but ACR-TR4 placing it at moderately suspicious nodule. Recommendation

## 2022-08-09 NOTE — Patient Instructions (Addendum)
Donna Lawson, it was a pleasure seeing you today! You endorsed feeling well today. Below are some of the things we talked about this visit. We look forward to seeing you in the follow up appointment!  Today we discussed: For your high blood pressure, we will increase your coreg to 12.5 mg BID, and increase your hydralazine to 10 mg three times a day from two times a day. We would like you to come back in one week. You do not want to get blood work done this visit. If you develop headache, chest pain or dizziness, go to the Emergency Department.   I have ordered the following labs today:   Lab Orders         BMP8+Anion Gap         Microalbumin / Creatinine Urine Ratio         Vitamin D (25 hydroxy)         Lipid Profile       Referrals ordered today:   Referral Orders  No referral(s) requested today     I have ordered the following medication/changed the following medications:   Stop the following medications: There are no discontinued medications.   Start the following medications: No orders of the defined types were placed in this encounter.    Follow-up: 1 week blood pressure follow up.    Please make sure to arrive 15 minutes prior to your next appointment. If you arrive late, you may be asked to reschedule.   We look forward to seeing you next time. Please call our clinic at [phone] if you have any questions or concerns. The best time to call is Monday-Friday from 9am-4pm, but there is someone available 24/7. If after hours or the weekend, call the main hospital number and ask for the Internal Medicine Resident On-Call. If you need medication refills, please notify your pharmacy one week in advance and they will send [place] a request.  Thank you for letting us take part in your care. Wishing you the best!  Thank you, Gwenevere Abbot, MD

## 2022-08-09 NOTE — Progress Notes (Signed)
Subjective:   Donna Lawson is a 77 y.o. female who presents for Medicare Annual (Subsequent) preventive examination. I connected with  Gwendel Hanson on 08/09/22 by a  IN PERSON  [facility]      Patient  location : [facility]  Provider Location: Office/Clinic  I discussed the limitations of evaluation and management by telemedicine. The patient expressed understanding and agreed to proceed.   Review of Systems    DEFERRED TO PCP  Cardiac Risk Factors include: advanced age (>24men, >8 women);dyslipidemia;hypertension     Objective:    Today's Vitals   08/09/22 1125  BP: (!) 183/77  Pulse: 78  Weight: 135 lb 6.4 oz (61.4 kg)  Height:  (1.575 m)  PainSc: 5    Body mass index is 24.76 kg/m.     08/09/2022   11:31 AM 08/09/2022    9:47 AM 08/12/2021    9:47 AM 07/15/2021   10:03 AM 03/25/2021    1:43 PM 03/22/2021    5:20 AM 03/21/2021    8:08 PM  Advanced Directives  Does Patient Have a Medical Advance Directive? No No No No No No No  Would patient like information on creating a medical advance directive? No - Patient declined No - Patient declined No - Patient declined No - Patient declined No - Patient declined No - Patient declined     Current Medications (verified) Outpatient Encounter Medications as of 08/09/2022  Medication Sig   amLODipine (NORVASC) 10 MG tablet Take 1 tablet by mouth once daily   aspirin 81 MG EC tablet TAKE 1 TABLET (81 MG TOTAL) BY MOUTH DAILY. SWALLOW WHOLE.   atorvastatin (LIPITOR) 40 MG tablet Take 1 tablet by mouth once daily   bismuth subsalicylate (PEPTO BISMOL) 262 MG/15ML suspension Take 30 mLs by mouth every 6 (six) hours as needed for indigestion.   carvedilol (COREG) 12.5 MG tablet Take 0.5 tablets (6.25 mg total) by mouth 2 (two) times daily with a meal.   hydrALAZINE (APRESOLINE) 10 MG tablet Take 1 tablet (10 mg total) by mouth 3 (three) times daily.   olmesartan (BENICAR) 20 MG tablet Take 1 tablet (20 mg total) by mouth  daily.   [DISCONTINUED] lisinopril-hydrochlorothiazide (PRINZIDE,ZESTORETIC) 20-25 MG per tablet Take 1 tablet by mouth 2 (two) times daily. Take 1 by mouth daily   No facility-administered encounter medications on file as of 08/09/2022.    Allergies (verified) Crestor [rosuvastatin calcium], Lisinopril, Cefuroxime axetil, and Oxycodone-acetaminophen   History: Past Medical History:  Diagnosis Date   Anxiety    Arthritis    Diverticula, colon 1999   Heart murmur    History of blood transfusion    Hyperlipidemia    Hypertension    Osteoarthritis of knee    right   PAC (premature atrial contraction) 06/17/2020   PONV (postoperative nausea and vomiting)    Prediabetes 09/25/2020   Recurrent upper abdominal pain with history of cholecystectomy 08/01/2020   Right eye symptoms 03/25/2021   Past Surgical History:  Procedure Laterality Date   ABDOMINAL SURGERY     CHOLECYSTECTOMY N/A 07/01/2020   Procedure: LAPAROSCOPIC CHOLECYSTECTOMY;  Surgeon: Griselda Miner, MD;  Location: [facility] OR;  Service: General;  Laterality: N/A;   COLONOSCOPY     GALLBLADDER SURGERY  07/2020   INTRAOPERATIVE CHOLANGIOGRAM N/A 07/01/2020   Procedure: INTRAOPERATIVE CHOLANGIOGRAM;  Surgeon: Griselda Miner, MD;  Location: [facility] OR;  Service: General;  Laterality: N/A;   TRANSCAROTID ARTERY REVASCULARIZATION  Left 08/27/2020   Procedure: LEFT  TRANSCAROTID ARTERY REVASCULARIZATION;  Surgeon: Maeola Harman, MD;  Location: [facility] OR;  Service: Vascular;  Laterality: Left;   ULTRASOUND GUIDANCE FOR VASCULAR ACCESS Right 08/27/2020   Procedure: ULTRASOUND GUIDANCE FOR VASCULAR ACCESS;  Surgeon: Maeola Harman, MD;  Location: [facility] OR;  Service: Vascular;  Laterality: Right;   Family History  Problem Relation Age of Onset   Hypertension Son    Hypertension Other    Arthritis Adoptive Father    Arthritis Adoptive Mother    Social History   Socioeconomic History   Marital status: Widowed    Spouse  name: Not on file   Number of children: Not on file   Years of education: Not on file   Highest education level: Not on file  Occupational History   Not on file  Tobacco Use   Smoking status: Former   Smokeless tobacco: Never  Vaping Use   Vaping Use: Never used  Substance and Sexual Activity   Alcohol use: No    Alcohol/week: 0.0 standard drinks of alcohol   Drug use: No   Sexual activity: Not on file  Other Topics Concern   Not on file  Social History Narrative   Widowed, lives alone with 2 dogs      Current Social History 03/02/2021        Patient lives with grandson in a home which is 2 stories. There are not steps up to the entrance the patient uses.       Patient's method of transportation is via family member.      The highest level of education was high school diploma.      The patient currently retired.      Identified important Relationships are "my children"       Pets : 1 dog       Interests / Fun: "I weed, sometimes I paint, I watch TV, I take care of my home"       Current Stressors: "none"       Religious / Personal Beliefs: "Catholic"       Social Determinants of Health   Financial Resource Strain: Low Risk  (08/09/2022)   Overall Financial Resource Strain (CARDIA)    Difficulty of Paying Living Expenses: Not hard at all  Food Insecurity: No Food Insecurity (08/09/2022)   Hunger Vital Sign    Worried About Running Out of Food in the Last Year: Never true    Ran Out of Food in the Last Year: Never true  Transportation Needs: No Transportation Needs (08/09/2022)   PRAPARE - Administrator, Civil Service (Medical): No    Lack of Transportation (Non-Medical): No  Physical Activity: Inactive (08/09/2022)   Exercise Vital Sign    Days of Exercise per Week: 0 days    Minutes of Exercise per Session: 0 min  Stress: No Stress Concern Present (08/09/2022)   [employer] of Occupational Health - Occupational Stress Questionnaire    Feeling  of Stress : Not at all  Social Connections: Moderately Isolated (08/09/2022)   Social Connection and Isolation Panel [NHANES]    Frequency of Communication with Friends and Family: More than three times a week    Frequency of Social Gatherings with Friends and Family: Three times a week    Attends Religious Services: More than 4 times per year    Active Member of Clubs or Organizations: No    Attends Banker Meetings: Never    Marital Status: Widowed  Tobacco Counseling Counseling given: Not Answered   Clinical Intake:  Pre-visit preparation completed: Yes  Pain : 0-10 Pain Score: 5  Pain Type: Chronic pain Pain Location: Knee Pain Orientation: Left Pain Descriptors / Indicators: Constant Pain Onset: More than a month ago     BMI - recorded: 24 Nutritional Status: BMI of 19-24  Normal Nutritional Risks: None Diabetes: No  How often do you need to have someone help you when you read instructions, pamphlets, or other written materials from your doctor or pharmacy?: 1 - Never What is the last grade level you completed in school?: 12 GRADE  Diabetic?NO   Interpreter Needed?: No  Information entered by :: [facility] Casee Knepp   Activities of Daily Living    08/09/2022   11:31 AM 08/09/2022    9:48 AM  In your present state of health, do you have any difficulty performing the following activities:  Hearing? 0 0  Vision? 0 0  Difficulty concentrating or making decisions? 0 0  Walking or climbing stairs? 1 1  Comment  AT TIMES  Dressing or bathing? 0 0  Doing errands, shopping? 1 1  Comment  AT  TIMES / DON'T DRIVE  Preparing Food and eating ? N   Using the Toilet? N   In the past six months, have you accidently leaked urine? N   Do you have problems with loss of bowel control? N   Managing your Medications? N   Managing your Finances? N   Housekeeping or managing your Housekeeping? N     Patient Care Team: Doran Stabler, DO as PCP - General (Internal  Medicine) Judi Saa, DO (Sports Medicine) Ihor Austin, NP as Nurse Practitioner (Neurology)  Indicate any recent Medical Services you may have received from other than [facility] providers in the past year (date may be approximate).     Assessment:   This is a routine wellness examination for Donna Lawson.  Hearing/Vision screen No results found.  Dietary issues and exercise activities discussed:     Goals Addressed   None   Depression Screen    08/09/2022   11:30 AM 08/09/2022    9:48 AM 08/12/2021    9:47 AM 07/15/2021   10:06 AM 07/31/2020   10:23 AM 06/18/2020   10:17 AM 02/25/2017   10:10 AM  PHQ 2/9 Scores  PHQ - 2 Score 0 0 0 0 0 0 0  PHQ- 9 Score  0 0  0 1     Fall Risk    08/09/2022   11:31 AM 08/09/2022    9:48 AM 08/12/2021    9:47 AM 07/15/2021   10:01 AM 03/25/2021    1:43 PM  Fall Risk   Falls in the past year? 0 0 0 0 0  Number falls in past yr: 0  0 0 0  Injury with Fall?   0 0 0  Risk for fall due to : No Fall Risks No Fall Risks No Fall Risks  No Fall Risks  Follow up Falls evaluation completed;Falls prevention discussed Falls evaluation completed Falls evaluation completed;Falls prevention discussed Falls evaluation completed Falls evaluation completed;Falls prevention discussed    FALL RISK PREVENTION PERTAINING TO THE HOME:  Any stairs in or around the home? Yes  If so, are there any without handrails? No  Home free of loose throw rugs in walkways, pet beds, electrical cords, etc? Yes  Adequate lighting in your home to reduce risk of falls? Yes   ASSISTIVE DEVICES UTILIZED TO PREVENT  FALLS:  Life alert? No  Use of a cane, walker or w/c? No  Grab bars in the bathroom? Yes  Shower chair or bench in shower? No  Elevated toilet seat or a handicapped toilet? No   TIMED UP AND GO:  Was the test performed? No .  Length of time to ambulate 10 feet: N/A sec.     Cognitive Function:        08/09/2022   11:33 AM 03/02/2021   11:34 AM  6CIT  Screen  What Year? 0 points 0 points  What month? 0 points 0 points  What time? 0 points 0 points  Count back from 20 0 points 0 points  Months in reverse 0 points 0 points  Repeat phrase 0 points 0 points  Total Score 0 points 0 points    Immunizations Immunization History  Administered Date(s) Administered   Fluad Quad(high Dose 65+) 03/25/2021   Influenza Split 02/25/2017   Influenza, High Dose Seasonal PF 02/25/2015, 02/25/2017, 02/23/2019, 03/10/2020   PFIZER(Purple Top)SARS-COV-2 Vaccination 05/24/2019, 06/14/2019   Pneumococcal Conjugate-13 07/31/2020   Tdap 07/31/2020    TDAP status: Up to date  Flu Vaccine status: Up to date  Pneumococcal vaccine status: Due, Education has been provided regarding the importance of this vaccine. Advised may receive this vaccine at local pharmacy or Health Dept. Aware to provide a copy of the vaccination record if obtained from local pharmacy or Health Dept. Verbalized acceptance and understanding.  Covid-19 vaccine status: Completed vaccines  Qualifies for Shingles Vaccine? Yes   Zostavax completed No   Shingrix Completed?: No.    Education has been provided regarding the importance of this vaccine. Patient has been advised to call insurance company to determine out of pocket expense if they have not yet received this vaccine. Advised may also receive vaccine at local pharmacy or Health Dept. Verbalized acceptance and understanding.  Screening Tests Health Maintenance  Topic Date Due   Hepatitis C Screening  Never done   Zoster Vaccines- Shingrix (1 of 2) Never done   DEXA SCAN  Never done   Pneumonia Vaccine 26+ Years old (2 of 2 - PPSV23 or PCV20) 07/31/2021   INFLUENZA VACCINE  11/18/2022   Medicare Annual Wellness (AWV)  08/09/2023   DTaP/Tdap/Td (2 - Td or Tdap) 08/01/2030   HPV VACCINES  Aged Out   COLONOSCOPY (Pts 45-38yrs Insurance coverage will need to be confirmed)  Discontinued   COVID-19 Vaccine  Discontinued     Health Maintenance  Health Maintenance Due  Topic Date Due   Hepatitis C Screening  Never done   Zoster Vaccines- Shingrix (1 of 2) Never done   DEXA SCAN  Never done   Pneumonia Vaccine 93+ Years old (2 of 2 - PPSV23 or PCV20) 07/31/2021    Colorectal cancer screening:  DISCONTINUED     Lung Cancer Screening: (Low Dose CT Chest recommended if Age 63-80 years, 30 pack-year currently smoking OR have quit w/in 15years.) does not qualify.   Lung Cancer Screening Referral: DEFERRED TO PCP  Additional Screening:  Hepatitis C Screening: does not qualify; Completed DEFERRED TO PCP   Vision Screening: Recommended annual ophthalmology exams for early detection of glaucoma and other disorders of the eye. Is the patient up to date with their annual eye exam?  Yes  Who is the provider or what is the name of the office in which the patient attends annual eye exams? PT DOES NOT REMEMBER HE NAME  If pt is not established  with a provider, would they like to be referred to a provider to establish care? No .   Dental Screening: Recommended annual dental exams for proper oral hygiene  Community Resource Referral / Chronic Care Management: CRR required this visit?  No   CCM required this visit?  No      Plan:     I have personally reviewed and noted the following in the patient's chart:   Medical and social history Use of alcohol, tobacco or illicit drugs  Current medications and supplements including opioid prescriptions. Patient is not currently taking opioid prescriptions. Functional ability and status Nutritional status Physical activity Advanced directives List of other physicians Hospitalizations, surgeries, and ER visits in previous 12 months Vitals Screenings to include cognitive, depression, and falls Referrals and appointments  In addition, I have reviewed and discussed with patient certain preventive protocols, quality metrics, and best practice recommendations. A  written personalized care plan for preventive services as well as general preventive health recommendations were provided to patient.     Derrell Lolling, CMA   08/09/2022   Nurse Notes: IN PERSON   Donna Lawson , Thank you for taking time to come for your Medicare Wellness Visit. I appreciate your ongoing commitment to your health goals. Please review the following plan we discussed and let me know if I can assist you in the future.   These are the goals we discussed:  Goals      Patient Stated     "I would like my health to improve to how it was before my stroke, especially my vision"        This is a list of the screening recommended for you and due dates:  Health Maintenance  Topic Date Due   Hepatitis C Screening: USPSTF Recommendation to screen - Ages 76-79 yo.  Never done   Zoster (Shingles) Vaccine (1 of 2) Never done   DEXA scan (bone density measurement)  Never done   Pneumonia Vaccine (2 of 2 - PPSV23 or PCV20) 07/31/2021   Flu Shot  11/18/2022   Medicare Annual Wellness Visit  08/09/2023   DTaP/Tdap/Td vaccine (2 - Td or Tdap) 08/01/2030   HPV Vaccine  Aged Out   Colon Cancer Screening  Discontinued   COVID-19 Vaccine  Discontinued

## 2022-08-10 NOTE — Assessment & Plan Note (Signed)
Plan was to repeat blood work but patient declined.

## 2022-08-10 NOTE — Assessment & Plan Note (Signed)
Follows with nephrology.  States she saw them last week and they told her kidneys were fine.  Will await nephrology and reviewed them with the patient next week.

## 2022-08-10 NOTE — Assessment & Plan Note (Addendum)
Pt with hx of HTN. Her BP was uncontrolled at 193/60. Repeated x3 and still elevated. Pt is asymptomatic. Her home regimen is Hydralazine 10 mg BID, Amlodipine 10 mg qd, Coreg 6.25 mg BID, Olmesartan 20 mg. Pt states she saw nephrologist last week and her blood pressure was normal at 130/80 and no medication changes were made. She reports home readings at 150-160s/90s. She reports nephrologist checked her blood work and her urine and stated everything looked good and she needs to be seen by nephrologist every 6 months. I expressed my concern that she was last seen one year ago and she needs frequent follow up especially given uncontrolled HTN. She refused blood work today and stated being in the hospital increases her anxiety. I advised increasing her hydralazine to 10 mg TID and Coreg to 12.5 mg BID. I will await the notes from her nephrologist before making further changes to see how her renal function is. I advised pt of having an in person appointment next week to ensure her blood pressure is well controlled. I provided education on dangers of significantly elevated blood pressure and gave her precautions to return to ED if she develops symptoms. Will place care coordination order for transportation needs.

## 2022-08-10 NOTE — Assessment & Plan Note (Signed)
Recommended performing hepatitis C testing today but patient refused.

## 2022-08-16 ENCOUNTER — Encounter: Payer: Medicare HMO | Admitting: Student

## 2022-08-18 NOTE — Progress Notes (Signed)
I reviewed the AWV findings with the provider who conducted the visit. I was present in the office suite and immediately available to provide assistance and direction throughout the time the service was provided.  

## 2022-08-19 NOTE — Addendum Note (Signed)
Addended by: Gwenevere Abbot on: 08/19/2022 11:07 AM   Modules accepted: Orders

## 2022-08-19 NOTE — Progress Notes (Signed)
Internal Medicine Clinic Attending  Case discussed with Dr. Khan  at the time of the visit.  We reviewed the resident's history and exam and pertinent patient test results.  I agree with the assessment, diagnosis, and plan of care documented in the resident's note.  

## 2022-08-19 NOTE — Addendum Note (Signed)
Addended by: Debe Coder B on: 08/19/2022 10:59 AM   Modules accepted: Level of Service

## 2022-08-23 ENCOUNTER — Telehealth: Payer: Self-pay | Admitting: *Deleted

## 2022-08-23 NOTE — Progress Notes (Signed)
  Care Coordination  Outreach Note  08/23/2022 Name: Donna Lawson MRN: [id number] DOB: April 13, 1946   Care Coordination Outreach Attempts: An unsuccessful telephone outreach was attempted today to offer the patient information about available care coordination services.  Follow Up Plan:  Additional outreach attempts will be made to offer the patient care coordination information and services.   Encounter Outcome:  No Answer  Christie Nottingham  Care Coordination Care Guide  Direct Dial: [phone]

## 2022-08-24 NOTE — Progress Notes (Signed)
  Care Coordination  Outreach Note  08/24/2022 Name: Chavely Garity MRN: [id number] DOB: Mar 11, 1946   Care Coordination Outreach Attempts: A second unsuccessful outreach was attempted today to offer the patient with information about available care coordination services.  Follow Up Plan:  Additional outreach attempts will be made to offer the patient care coordination information and services.   Encounter Outcome:  No Answer  Christie Nottingham  Care Coordination Care Guide  Direct Dial: [phone]

## 2022-08-27 NOTE — Progress Notes (Signed)
  Care Coordination   Note   08/27/2022 Name: Donna Lawson MRN: [id number] DOB: 1945-05-12  Donna Lawson is a 77 y.o. year old female who sees Doran Stabler, DO for primary care. I reached out to Gwendel Hanson by phone today to offer care coordination services.  [name] was given information about Care Coordination services today including:   The Care Coordination services include support from the care team which includes your Nurse Coordinator, Clinical Social Worker, or Pharmacist.  The Care Coordination team is here to help remove barriers to the health concerns and goals most important to you. Care Coordination services are voluntary, and the patient may decline or stop services at any time by request to their care team member.   Care Coordination Consent Status: Patient did not agree to participate in care coordination services at this time.    Encounter Outcome:  Pt. Refused  [facility] Coordination Care Guide  Direct Dial: [phone]

## 2022-09-13 ENCOUNTER — Encounter: Payer: Self-pay | Admitting: *Deleted

## 2022-11-09 DIAGNOSIS — Z79899 Other long term (current) drug therapy: Secondary | ICD-10-CM | POA: Diagnosis not present

## 2022-11-09 DIAGNOSIS — N2581 Secondary hyperparathyroidism of renal origin: Secondary | ICD-10-CM | POA: Diagnosis not present

## 2022-11-09 DIAGNOSIS — N184 Chronic kidney disease, stage 4 (severe): Secondary | ICD-10-CM | POA: Diagnosis not present

## 2022-11-09 DIAGNOSIS — Z114 Encounter for screening for human immunodeficiency virus [HIV]: Secondary | ICD-10-CM | POA: Diagnosis not present

## 2022-11-09 DIAGNOSIS — I129 Hypertensive chronic kidney disease with stage 1 through stage 4 chronic kidney disease, or unspecified chronic kidney disease: Secondary | ICD-10-CM | POA: Diagnosis not present

## 2022-11-09 DIAGNOSIS — Z1159 Encounter for screening for other viral diseases: Secondary | ICD-10-CM | POA: Diagnosis not present

## 2022-11-09 DIAGNOSIS — Z136 Encounter for screening for cardiovascular disorders: Secondary | ICD-10-CM | POA: Diagnosis not present

## 2022-11-09 DIAGNOSIS — Z0000 Encounter for general adult medical examination without abnormal findings: Secondary | ICD-10-CM | POA: Diagnosis not present

## 2022-11-09 DIAGNOSIS — E663 Overweight: Secondary | ICD-10-CM | POA: Diagnosis not present

## 2022-11-09 DIAGNOSIS — E785 Hyperlipidemia, unspecified: Secondary | ICD-10-CM | POA: Diagnosis not present

## 2022-11-09 DIAGNOSIS — I10 Essential (primary) hypertension: Secondary | ICD-10-CM | POA: Diagnosis not present

## 2022-11-10 ENCOUNTER — Other Ambulatory Visit: Payer: Self-pay

## 2022-11-10 DIAGNOSIS — I10 Essential (primary) hypertension: Secondary | ICD-10-CM

## 2022-11-10 MED ORDER — HYDRALAZINE HCL 10 MG PO TABS: 10.0000 mg | ORAL_TABLET | Freq: Three times a day (TID) | ORAL | 0 refills | Status: AC

## 2023-02-03 IMAGING — US US FNA BIOPSY THYROID 1ST LESION
1 series · 13 of 14 positions shown · non-contrast
Comparison: Ultrasound done July 16, 2021

MEDICATIONS:
1% lidocaine 3 mL

COMPLICATIONS:
None immediate.

INDICATION: Enlarging previously biopsied nodule occupying the majority of the
right gland now measures 5.9 x 3.6 x 5.7 cm compared to 4.1 x 2.5 x
4.1 cm in 7072.

EXAM:
ULTRASOUND GUIDED FINE NEEDLE ASPIRATION OF INDETERMINATE THYROID
NODULE
TECHNIQUE: Informed written consent was obtained from the patient after a
discussion of the risks, benefits and alternatives to treatment.
Questions regarding the procedure were encouraged and answered. A
timeout was performed prior to the initiation of the procedure.

[Series 1: us fna bx thyroid 1st lesion afirma · 14 acquisitions, 13 frames shown]
[im 1/14]
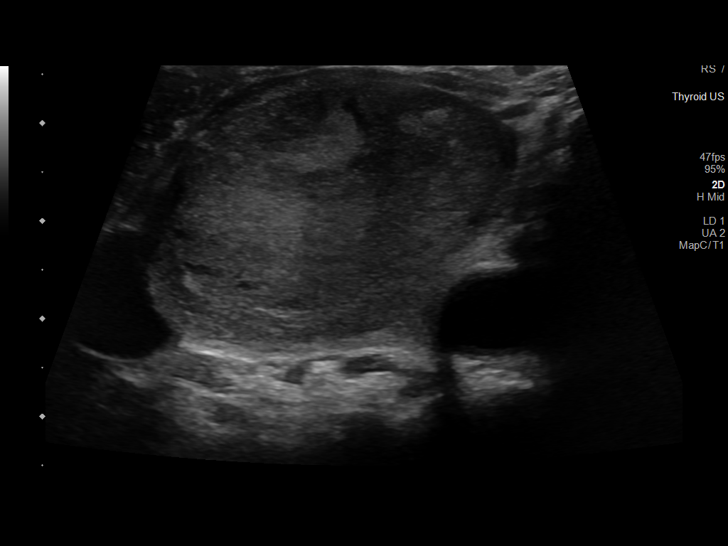
[im 2/14]
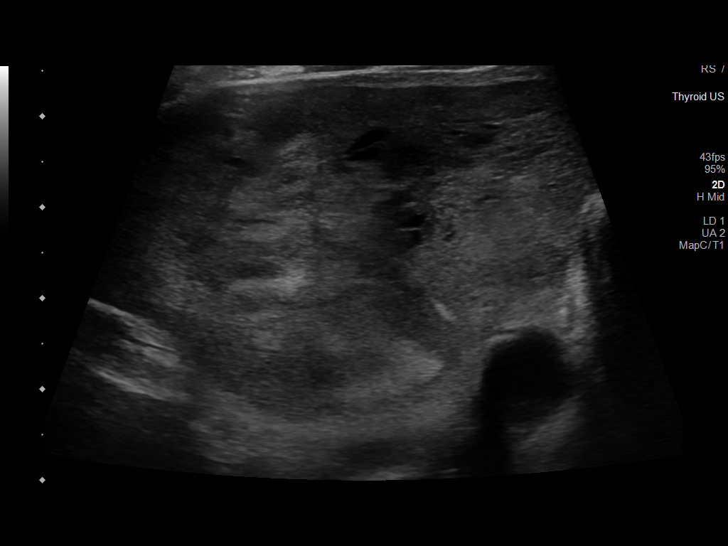
[im 3/14]
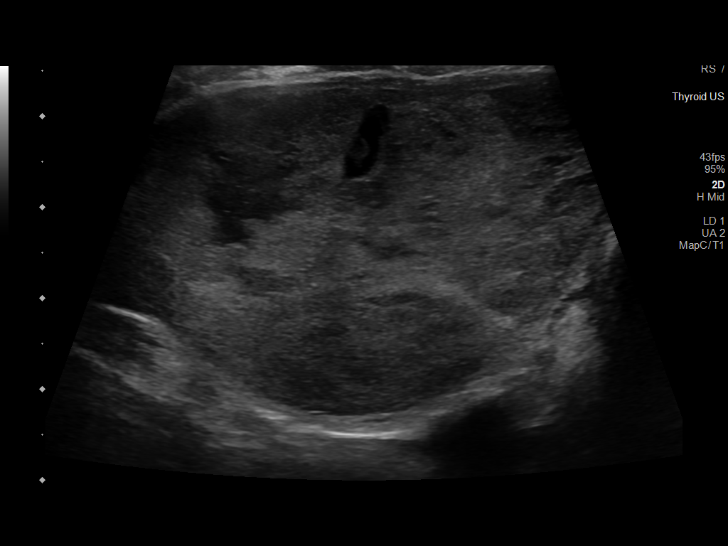
[im 4/14]
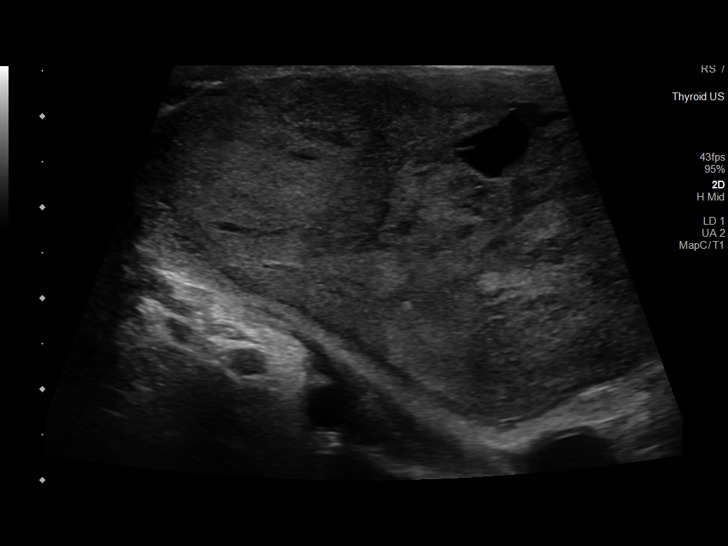
[im 5/14]
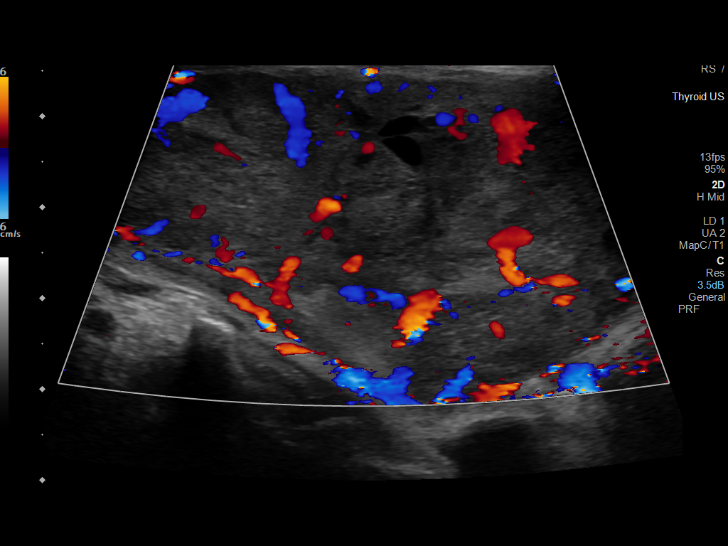
[im 6/14]
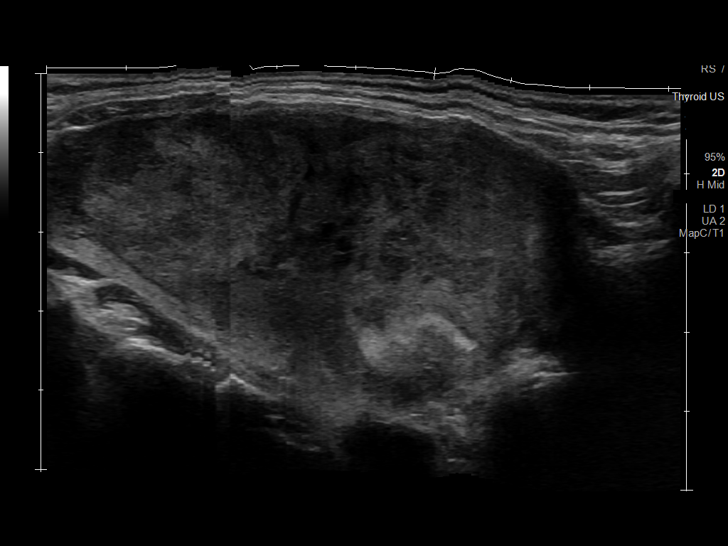
[im 8/14]
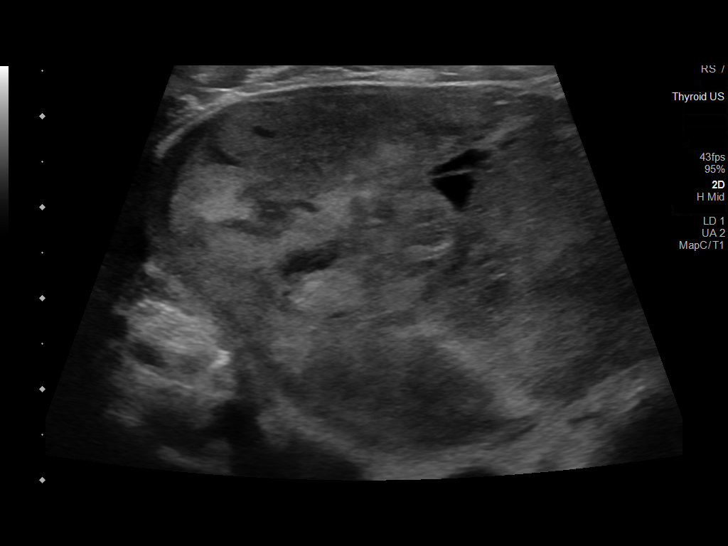
[im 9/14]
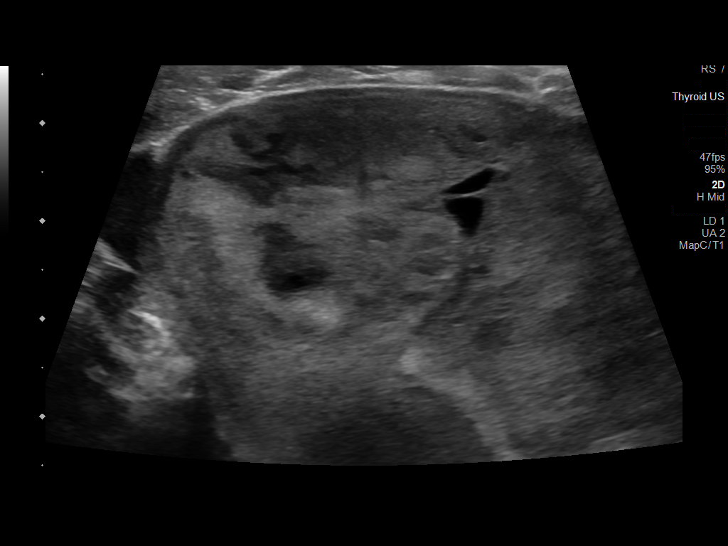
[im 10/14]
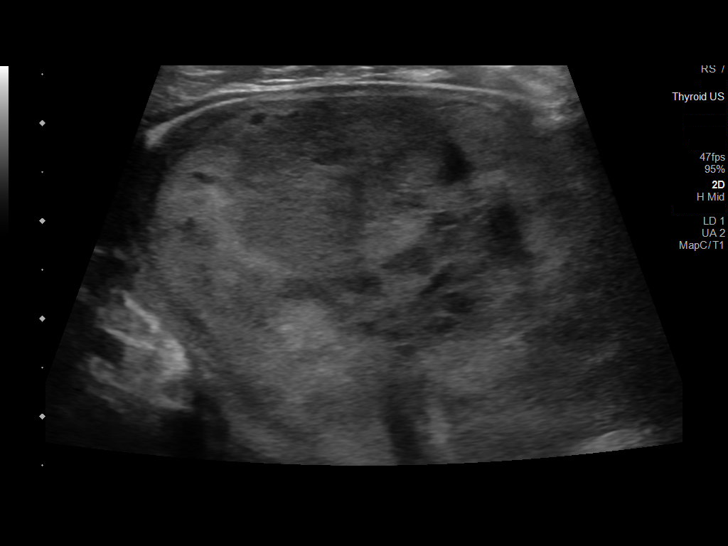
[im 11/14]
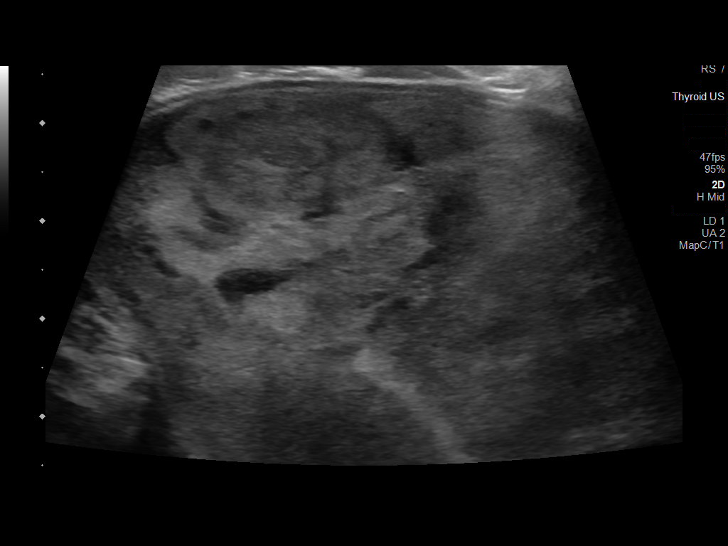
[im 12/14]
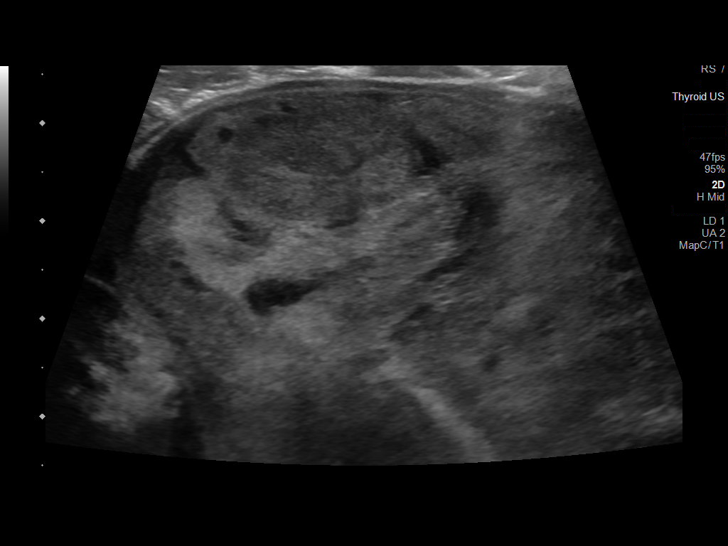
[im 13/14]
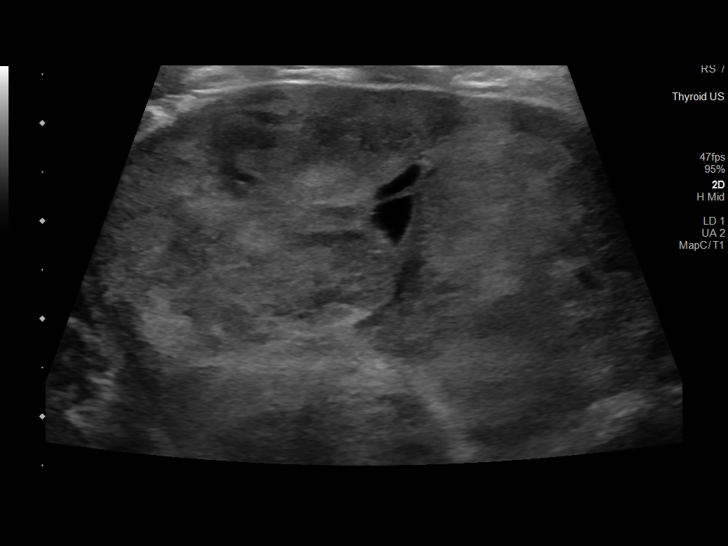
[im 14/14]
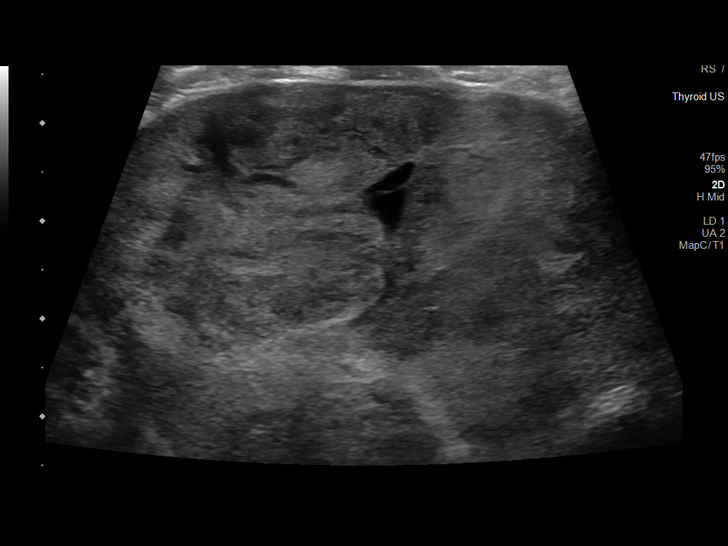

[13 of 14 positions shown; findings below may reference images not displayed]

Pre-procedural ultrasound scanning demonstrated unchanged size and
appearance of the indeterminate nodule within the right lobe of the
thyroid.

The procedure was planned. The neck was prepped in the usual sterile
fashion, and a sterile drape was applied covering the operative
field. A timeout was performed prior to the initiation of the
procedure. Local anesthesia was provided with 1% lidocaine.

Under direct ultrasound guidance, 5 FNA biopsies were performed of
the right thyroid nodule with a 25 gauge needle. Multiple ultrasound
images were saved for procedural documentation purposes. The samples
were prepared and submitted to pathology.

Limited post procedural scanning was negative for hematoma or
additional complication. Dressings were placed. The patient
tolerated the above procedures procedure well without immediate
postprocedural complication.
FINDINGS: FINDINGS
Nodule reference number based on prior diagnostic ultrasound: 1

Maximum size: 5.9 cm

Location: Right  ;  Mid

ACR TI-RADS risk category:  TR4

Reason for biopsy: meets other recommendations

Ultrasound imaging confirms appropriate placement of the needles
within the thyroid nodule.
IMPRESSION: Technically successful ultrasound guided fine needle aspiration of
the right thyroid nodule.

## 2023-03-01 DIAGNOSIS — E785 Hyperlipidemia, unspecified: Secondary | ICD-10-CM | POA: Diagnosis not present

## 2023-03-01 DIAGNOSIS — N2581 Secondary hyperparathyroidism of renal origin: Secondary | ICD-10-CM | POA: Diagnosis not present

## 2023-03-01 DIAGNOSIS — I129 Hypertensive chronic kidney disease with stage 1 through stage 4 chronic kidney disease, or unspecified chronic kidney disease: Secondary | ICD-10-CM | POA: Diagnosis not present

## 2023-03-01 DIAGNOSIS — N184 Chronic kidney disease, stage 4 (severe): Secondary | ICD-10-CM | POA: Diagnosis not present

## 2023-03-01 DIAGNOSIS — E049 Nontoxic goiter, unspecified: Secondary | ICD-10-CM | POA: Diagnosis not present

## 2023-03-31 DIAGNOSIS — E785 Hyperlipidemia, unspecified: Secondary | ICD-10-CM | POA: Diagnosis not present

## 2023-03-31 DIAGNOSIS — N184 Chronic kidney disease, stage 4 (severe): Secondary | ICD-10-CM | POA: Diagnosis not present

## 2023-03-31 DIAGNOSIS — I129 Hypertensive chronic kidney disease with stage 1 through stage 4 chronic kidney disease, or unspecified chronic kidney disease: Secondary | ICD-10-CM | POA: Diagnosis not present

## 2024-01-24 ENCOUNTER — Other Ambulatory Visit (HOSPITAL_COMMUNITY): Payer: Self-pay | Admitting: Otolaryngology

## 2024-01-24 DIAGNOSIS — E041 Nontoxic single thyroid nodule: Secondary | ICD-10-CM

## 2024-02-03 ENCOUNTER — Ambulatory Visit (HOSPITAL_COMMUNITY): Admission: RE | Admit: 2024-02-03 | Source: Ambulatory Visit

## 2024-02-10 ENCOUNTER — Encounter (HOSPITAL_COMMUNITY): Payer: Self-pay

## 2024-02-10 ENCOUNTER — Ambulatory Visit (HOSPITAL_COMMUNITY)

## 2024-03-01 ENCOUNTER — Ambulatory Visit (HOSPITAL_COMMUNITY)
Admission: RE | Admit: 2024-03-01 | Discharge: 2024-03-01 | Disposition: A | Discharge status: Home / Self Care | Source: Ambulatory Visit | Attending: Otolaryngology | Admitting: Otolaryngology

## 2024-03-01 DIAGNOSIS — E041 Nontoxic single thyroid nodule: Secondary | ICD-10-CM | POA: Diagnosis present
